# Patient Record
Sex: Male | Born: 1970 | Race: White | Hispanic: No | Smoking: Current every day smoker
Health system: Southern US, Community
[De-identification: ages and names within clinical notes are randomized; demographics above are authoritative.]

## PROBLEM LIST (undated history)

## (undated) DIAGNOSIS — F419 Anxiety disorder, unspecified: Secondary | ICD-10-CM

## (undated) DIAGNOSIS — F250 Schizoaffective disorder, bipolar type: Secondary | ICD-10-CM

## (undated) DIAGNOSIS — F319 Bipolar disorder, unspecified: Secondary | ICD-10-CM

## (undated) DIAGNOSIS — I639 Cerebral infarction, unspecified: Secondary | ICD-10-CM

## (undated) HISTORY — PX: FINGER SURGERY: SHX640

---

## 1998-10-27 ENCOUNTER — Encounter: Payer: Self-pay | Admitting: Emergency Medicine

## 1998-10-27 ENCOUNTER — Emergency Department (HOSPITAL_COMMUNITY): Admission: EM | Admit: 1998-10-27 | Discharge: 1998-10-27 | Payer: Self-pay | Admitting: Emergency Medicine

## 1999-08-15 ENCOUNTER — Emergency Department (HOSPITAL_COMMUNITY): Admission: EM | Admit: 1999-08-15 | Discharge: 1999-08-15 | Payer: Self-pay | Admitting: Emergency Medicine

## 1999-12-24 ENCOUNTER — Emergency Department (HOSPITAL_COMMUNITY): Admission: EM | Admit: 1999-12-24 | Discharge: 1999-12-25 | Payer: Self-pay | Admitting: Emergency Medicine

## 2000-06-09 ENCOUNTER — Emergency Department (HOSPITAL_COMMUNITY): Admission: EM | Admit: 2000-06-09 | Discharge: 2000-06-09 | Payer: Self-pay | Admitting: Emergency Medicine

## 2001-06-26 ENCOUNTER — Emergency Department (HOSPITAL_COMMUNITY): Admission: EM | Admit: 2001-06-26 | Discharge: 2001-06-26 | Payer: Self-pay | Admitting: Emergency Medicine

## 2001-06-28 ENCOUNTER — Encounter: Admission: RE | Admit: 2001-06-28 | Discharge: 2001-06-28 | Payer: Self-pay | Admitting: Internal Medicine

## 2003-03-20 ENCOUNTER — Emergency Department (HOSPITAL_COMMUNITY): Admission: EM | Admit: 2003-03-20 | Discharge: 2003-03-20 | Payer: Self-pay | Admitting: Emergency Medicine

## 2003-09-14 ENCOUNTER — Emergency Department (HOSPITAL_COMMUNITY): Admission: EM | Admit: 2003-09-14 | Discharge: 2003-09-14 | Payer: Self-pay | Admitting: Family Medicine

## 2004-11-08 ENCOUNTER — Inpatient Hospital Stay (HOSPITAL_COMMUNITY): Admission: EM | Admit: 2004-11-08 | Discharge: 2004-11-11 | Payer: Self-pay | Admitting: Psychiatry

## 2004-11-08 ENCOUNTER — Ambulatory Visit: Payer: Self-pay | Admitting: Psychiatry

## 2004-11-11 ENCOUNTER — Emergency Department (HOSPITAL_COMMUNITY): Admission: EM | Admit: 2004-11-11 | Discharge: 2004-11-12 | Payer: Self-pay | Admitting: Emergency Medicine

## 2004-11-21 ENCOUNTER — Ambulatory Visit: Payer: Self-pay | Admitting: Hospitalist

## 2004-11-21 ENCOUNTER — Inpatient Hospital Stay (HOSPITAL_COMMUNITY): Admission: EM | Admit: 2004-11-21 | Discharge: 2004-11-26 | Payer: Self-pay | Admitting: Emergency Medicine

## 2004-11-23 ENCOUNTER — Ambulatory Visit: Payer: Self-pay | Admitting: Cardiology

## 2004-12-18 ENCOUNTER — Ambulatory Visit: Payer: Self-pay | Admitting: Hospitalist

## 2005-01-30 ENCOUNTER — Emergency Department (HOSPITAL_COMMUNITY): Admission: EM | Admit: 2005-01-30 | Discharge: 2005-01-30 | Payer: Self-pay | Admitting: Emergency Medicine

## 2005-02-01 ENCOUNTER — Emergency Department (HOSPITAL_COMMUNITY): Admission: EM | Admit: 2005-02-01 | Discharge: 2005-02-01 | Payer: Self-pay | Admitting: *Deleted

## 2005-11-06 ENCOUNTER — Emergency Department (HOSPITAL_COMMUNITY): Admission: EM | Admit: 2005-11-06 | Discharge: 2005-11-06 | Payer: Self-pay | Admitting: Emergency Medicine

## 2005-11-28 ENCOUNTER — Inpatient Hospital Stay (HOSPITAL_COMMUNITY): Admission: EM | Admit: 2005-11-28 | Discharge: 2005-12-01 | Payer: Self-pay | Admitting: Emergency Medicine

## 2005-11-30 ENCOUNTER — Encounter (INDEPENDENT_AMBULATORY_CARE_PROVIDER_SITE_OTHER): Payer: Self-pay | Admitting: *Deleted

## 2005-12-04 ENCOUNTER — Emergency Department (HOSPITAL_COMMUNITY): Admission: EM | Admit: 2005-12-04 | Discharge: 2005-12-04 | Payer: Self-pay | Admitting: *Deleted

## 2005-12-09 ENCOUNTER — Emergency Department (HOSPITAL_COMMUNITY): Admission: EM | Admit: 2005-12-09 | Discharge: 2005-12-10 | Payer: Self-pay | Admitting: Emergency Medicine

## 2005-12-09 ENCOUNTER — Emergency Department (HOSPITAL_COMMUNITY): Admission: EM | Admit: 2005-12-09 | Discharge: 2005-12-09 | Payer: Self-pay | Admitting: Emergency Medicine

## 2005-12-12 ENCOUNTER — Emergency Department (HOSPITAL_COMMUNITY): Admission: EM | Admit: 2005-12-12 | Discharge: 2005-12-12 | Payer: Self-pay | Admitting: Emergency Medicine

## 2006-05-29 ENCOUNTER — Emergency Department (HOSPITAL_COMMUNITY): Admission: EM | Admit: 2006-05-29 | Discharge: 2006-05-30 | Payer: Self-pay | Admitting: Emergency Medicine

## 2006-06-17 ENCOUNTER — Emergency Department (HOSPITAL_COMMUNITY): Admission: EM | Admit: 2006-06-17 | Discharge: 2006-06-17 | Payer: Self-pay | Admitting: Emergency Medicine

## 2006-06-18 ENCOUNTER — Emergency Department (HOSPITAL_COMMUNITY): Admission: EM | Admit: 2006-06-18 | Discharge: 2006-06-18 | Payer: Self-pay | Admitting: Emergency Medicine

## 2006-07-25 ENCOUNTER — Emergency Department (HOSPITAL_COMMUNITY): Admission: EM | Admit: 2006-07-25 | Discharge: 2006-07-25 | Payer: Self-pay | Admitting: Emergency Medicine

## 2006-09-13 ENCOUNTER — Emergency Department (HOSPITAL_COMMUNITY): Admission: EM | Admit: 2006-09-13 | Discharge: 2006-09-13 | Payer: Self-pay | Admitting: Emergency Medicine

## 2006-09-22 ENCOUNTER — Ambulatory Visit: Payer: Self-pay | Admitting: Psychiatry

## 2006-09-22 ENCOUNTER — Emergency Department (HOSPITAL_COMMUNITY): Admission: EM | Admit: 2006-09-22 | Discharge: 2006-09-22 | Payer: Self-pay | Admitting: Emergency Medicine

## 2006-09-22 ENCOUNTER — Inpatient Hospital Stay (HOSPITAL_COMMUNITY): Admission: AD | Admit: 2006-09-22 | Discharge: 2006-09-26 | Payer: Self-pay | Admitting: Psychiatry

## 2006-11-17 ENCOUNTER — Emergency Department (HOSPITAL_COMMUNITY): Admission: EM | Admit: 2006-11-17 | Discharge: 2006-11-17 | Payer: Self-pay | Admitting: Emergency Medicine

## 2006-12-13 ENCOUNTER — Emergency Department (HOSPITAL_COMMUNITY): Admission: EM | Admit: 2006-12-13 | Discharge: 2006-12-13 | Payer: Self-pay | Admitting: Emergency Medicine

## 2006-12-15 ENCOUNTER — Ambulatory Visit: Payer: Self-pay | Admitting: *Deleted

## 2006-12-31 ENCOUNTER — Emergency Department (HOSPITAL_COMMUNITY): Admission: EM | Admit: 2006-12-31 | Discharge: 2006-12-31 | Payer: Self-pay | Admitting: Emergency Medicine

## 2006-12-31 ENCOUNTER — Ambulatory Visit: Payer: Self-pay | Admitting: *Deleted

## 2006-12-31 ENCOUNTER — Inpatient Hospital Stay (HOSPITAL_COMMUNITY): Admission: EM | Admit: 2006-12-31 | Discharge: 2007-01-06 | Payer: Self-pay | Admitting: *Deleted

## 2007-01-12 ENCOUNTER — Emergency Department (HOSPITAL_COMMUNITY): Admission: EM | Admit: 2007-01-12 | Discharge: 2007-01-13 | Payer: Self-pay | Admitting: Emergency Medicine

## 2007-02-11 ENCOUNTER — Emergency Department (HOSPITAL_COMMUNITY): Admission: EM | Admit: 2007-02-11 | Discharge: 2007-02-11 | Payer: Self-pay | Admitting: Emergency Medicine

## 2007-02-21 ENCOUNTER — Emergency Department (HOSPITAL_COMMUNITY): Admission: EM | Admit: 2007-02-21 | Discharge: 2007-02-21 | Payer: Self-pay | Admitting: Emergency Medicine

## 2007-02-24 ENCOUNTER — Emergency Department (HOSPITAL_COMMUNITY): Admission: EM | Admit: 2007-02-24 | Discharge: 2007-02-24 | Payer: Self-pay | Admitting: Emergency Medicine

## 2007-02-26 ENCOUNTER — Emergency Department (HOSPITAL_COMMUNITY): Admission: EM | Admit: 2007-02-26 | Discharge: 2007-02-26 | Payer: Self-pay | Admitting: *Deleted

## 2007-03-12 ENCOUNTER — Emergency Department (HOSPITAL_COMMUNITY): Admission: EM | Admit: 2007-03-12 | Discharge: 2007-03-12 | Payer: Self-pay | Admitting: Emergency Medicine

## 2007-06-02 ENCOUNTER — Emergency Department (HOSPITAL_COMMUNITY): Admission: EM | Admit: 2007-06-02 | Discharge: 2007-06-02 | Payer: Self-pay | Admitting: Emergency Medicine

## 2007-08-28 ENCOUNTER — Ambulatory Visit: Payer: Self-pay | Admitting: Vascular Surgery

## 2007-08-28 ENCOUNTER — Encounter (INDEPENDENT_AMBULATORY_CARE_PROVIDER_SITE_OTHER): Payer: Self-pay | Admitting: Emergency Medicine

## 2007-08-28 ENCOUNTER — Emergency Department (HOSPITAL_COMMUNITY): Admission: EM | Admit: 2007-08-28 | Discharge: 2007-08-28 | Payer: Self-pay | Admitting: Emergency Medicine

## 2007-11-08 ENCOUNTER — Emergency Department (HOSPITAL_COMMUNITY): Admission: EM | Admit: 2007-11-08 | Discharge: 2007-11-08 | Payer: Self-pay | Admitting: Emergency Medicine

## 2008-01-15 ENCOUNTER — Emergency Department (HOSPITAL_COMMUNITY): Admission: EM | Admit: 2008-01-15 | Discharge: 2008-01-15 | Payer: Self-pay | Admitting: Emergency Medicine

## 2008-04-09 ENCOUNTER — Emergency Department (HOSPITAL_COMMUNITY): Admission: EM | Admit: 2008-04-09 | Discharge: 2008-04-09 | Payer: Self-pay | Admitting: Emergency Medicine

## 2008-08-26 ENCOUNTER — Emergency Department (HOSPITAL_COMMUNITY): Admission: EM | Admit: 2008-08-26 | Discharge: 2008-08-26 | Payer: Self-pay | Admitting: Emergency Medicine

## 2008-08-27 ENCOUNTER — Inpatient Hospital Stay (HOSPITAL_COMMUNITY): Admission: RE | Admit: 2008-08-27 | Discharge: 2008-08-31 | Payer: Self-pay | Admitting: *Deleted

## 2008-08-27 ENCOUNTER — Encounter: Payer: Self-pay | Admitting: Emergency Medicine

## 2008-08-27 ENCOUNTER — Ambulatory Visit: Payer: Self-pay | Admitting: *Deleted

## 2008-10-30 ENCOUNTER — Emergency Department (HOSPITAL_COMMUNITY): Admission: EM | Admit: 2008-10-30 | Discharge: 2008-10-30 | Payer: Self-pay | Admitting: Emergency Medicine

## 2008-10-31 ENCOUNTER — Inpatient Hospital Stay (HOSPITAL_COMMUNITY): Admission: AD | Admit: 2008-10-31 | Discharge: 2008-11-04 | Payer: Self-pay | Admitting: Psychiatry

## 2008-10-31 ENCOUNTER — Other Ambulatory Visit: Payer: Self-pay | Admitting: Emergency Medicine

## 2008-10-31 ENCOUNTER — Ambulatory Visit: Payer: Self-pay | Admitting: Psychiatry

## 2009-05-20 ENCOUNTER — Emergency Department (HOSPITAL_COMMUNITY): Admission: EM | Admit: 2009-05-20 | Discharge: 2009-05-20 | Payer: Self-pay | Admitting: Emergency Medicine

## 2009-10-23 ENCOUNTER — Emergency Department (HOSPITAL_COMMUNITY): Admission: EM | Admit: 2009-10-23 | Discharge: 2009-10-23 | Payer: Self-pay | Admitting: Emergency Medicine

## 2010-01-12 ENCOUNTER — Emergency Department (HOSPITAL_COMMUNITY): Admission: EM | Admit: 2010-01-12 | Discharge: 2010-01-12 | Payer: Self-pay | Admitting: Emergency Medicine

## 2010-05-05 LAB — CBC
HCT: 46.4 % (ref 39.0–52.0)
Hemoglobin: 16.3 g/dL (ref 13.0–17.0)
MCHC: 35.1 g/dL (ref 30.0–36.0)
RBC: 4.84 MIL/uL (ref 4.22–5.81)
WBC: 5.2 10*3/uL (ref 4.0–10.5)

## 2010-05-05 LAB — DIFFERENTIAL
Basophils Relative: 1 % (ref 0–1)
Lymphocytes Relative: 16 % (ref 12–46)
Monocytes Relative: 14 % — ABNORMAL HIGH (ref 3–12)
Neutro Abs: 3.5 10*3/uL (ref 1.7–7.7)
Neutrophils Relative %: 68 % (ref 43–77)

## 2010-05-05 LAB — POCT I-STAT, CHEM 8
BUN: 7 mg/dL (ref 6–23)
Chloride: 103 mEq/L (ref 96–112)
Glucose, Bld: 104 mg/dL — ABNORMAL HIGH (ref 70–99)
HCT: 50 % (ref 39.0–52.0)
Potassium: 3.8 mEq/L (ref 3.5–5.1)

## 2010-05-29 LAB — DIFFERENTIAL
Basophils Absolute: 0.1 10*3/uL (ref 0.0–0.1)
Eosinophils Relative: 2 % (ref 0–5)
Lymphocytes Relative: 17 % (ref 12–46)
Lymphocytes Relative: 19 % (ref 12–46)
Lymphs Abs: 1.5 10*3/uL (ref 0.7–4.0)
Monocytes Relative: 8 % (ref 3–12)
Neutro Abs: 5.2 10*3/uL (ref 1.7–7.7)
Neutrophils Relative %: 73 % (ref 43–77)
Neutrophils Relative %: 71 % (ref 43–77)

## 2010-05-29 LAB — RAPID URINE DRUG SCREEN, HOSP PERFORMED
Amphetamines: NOT DETECTED
Amphetamines: NOT DETECTED
Barbiturates: NOT DETECTED
Benzodiazepines: POSITIVE — AB
Benzodiazepines: POSITIVE — AB
Cocaine: POSITIVE — AB
Cocaine: POSITIVE — AB
Opiates: NOT DETECTED
Tetrahydrocannabinol: POSITIVE — AB

## 2010-05-29 LAB — CBC
HCT: 47.4 % (ref 39.0–52.0)
HCT: 45.9 % (ref 39.0–52.0)
MCV: 96.1 fL (ref 78.0–100.0)
Platelets: 215 10*3/uL (ref 150–400)
Platelets: 198 10*3/uL (ref 150–400)
RBC: 4.93 MIL/uL (ref 4.22–5.81)
RDW: 12.4 % (ref 11.5–15.5)
WBC: 8.9 10*3/uL (ref 4.0–10.5)

## 2010-05-29 LAB — ETHANOL
Alcohol, Ethyl (B): <5 mg/dL (ref 0–10)
Alcohol, Ethyl (B): <5 mg/dL (ref 0–10)

## 2010-05-29 LAB — BASIC METABOLIC PANEL
BUN: 13 mg/dL (ref 6–23)
Calcium: 8.9 mg/dL (ref 8.4–10.5)
Creatinine, Ser: 1.1 mg/dL (ref 0.4–1.5)
GFR calc non Af Amer: >60 mL/min (ref >60)
Glucose, Bld: 120 mg/dL — ABNORMAL HIGH (ref 70–99)

## 2010-05-29 LAB — POCT I-STAT, CHEM 8
BUN: 20 mg/dL (ref 6–23)
Creatinine, Ser: 1.1 mg/dL (ref 0.4–1.5)
Glucose, Bld: 89 mg/dL (ref 70–99)
Potassium: 3.9 mEq/L (ref 3.5–5.1)
Sodium: 137 mEq/L (ref 135–145)

## 2010-05-31 LAB — CBC
HCT: 46.2 % (ref 39.0–52.0)
Hemoglobin: 16.1 g/dL (ref 13.0–17.0)
Hemoglobin: 17 g/dL (ref 13.0–17.0)
MCHC: 34.9 g/dL (ref 30.0–36.0)
MCHC: 34.4 g/dL (ref 30.0–36.0)
MCV: 97.1 fL (ref 78.0–100.0)
MCV: 96.8 fL (ref 78.0–100.0)
Platelets: 240 10*3/uL (ref 150–400)
RBC: 4.76 MIL/uL (ref 4.22–5.81)
RBC: 5.09 MIL/uL (ref 4.22–5.81)
RDW: 13.2 % (ref 11.5–15.5)
WBC: 9.4 10*3/uL (ref 4.0–10.5)

## 2010-05-31 LAB — RAPID URINE DRUG SCREEN, HOSP PERFORMED
Amphetamines: NOT DETECTED
Barbiturates: NOT DETECTED
Benzodiazepines: NOT DETECTED
Cocaine: POSITIVE — AB
Cocaine: POSITIVE — AB
Opiates: NOT DETECTED
Tetrahydrocannabinol: NOT DETECTED
Tetrahydrocannabinol: POSITIVE — AB

## 2010-05-31 LAB — POCT I-STAT, CHEM 8
Creatinine, Ser: 1 mg/dL (ref 0.4–1.5)
Glucose, Bld: 96 mg/dL (ref 70–99)
Hemoglobin: 16.7 g/dL (ref 13.0–17.0)
TCO2: 28 mmol/L (ref 0–100)

## 2010-05-31 LAB — BASIC METABOLIC PANEL
CO2: 26 mEq/L (ref 19–32)
Chloride: 107 mEq/L (ref 96–112)
GFR calc Af Amer: >60 mL/min (ref >60)
Potassium: 3.6 mEq/L (ref 3.5–5.1)
Sodium: 140 mEq/L (ref 135–145)

## 2010-05-31 LAB — DIFFERENTIAL
Basophils Absolute: 0 K/uL (ref 0.0–0.1)
Basophils Relative: 0 % (ref 0–1)
Basophils Relative: 0 % (ref 0–1)
Eosinophils Absolute: 0 10*3/uL (ref 0.0–0.7)
Eosinophils Absolute: 0 10*3/uL (ref 0.0–0.7)
Eosinophils Relative: 1 % (ref 0–5)
Lymphocytes Relative: 17 % (ref 12–46)
Lymphs Abs: 1.6 10*3/uL (ref 0.7–4.0)
Monocytes Absolute: 0.6 K/uL (ref 0.1–1.0)
Monocytes Absolute: 0.6 10*3/uL (ref 0.1–1.0)
Monocytes Relative: 7 % (ref 3–12)
Monocytes Relative: 8 % (ref 3–12)
Neutro Abs: 7.1 K/uL (ref 1.7–7.7)
Neutro Abs: 6 10*3/uL (ref 1.7–7.7)
Neutrophils Relative %: 75 % (ref 43–77)

## 2010-05-31 LAB — ETHANOL: Alcohol, Ethyl (B): <5 mg/dL (ref 0–10)

## 2010-06-09 LAB — POCT CARDIAC MARKERS
CKMB, poc: <1 ng/mL — ABNORMAL LOW (ref 1.0–8.0)
Troponin i, poc: <0.05 ng/mL (ref 0.00–0.09)

## 2010-06-09 LAB — POCT I-STAT, CHEM 8
BUN: 10 mg/dL (ref 6–23)
Calcium, Ion: 1.18 mmol/L (ref 1.12–1.32)
Chloride: 100 mEq/L (ref 96–112)
Creatinine, Ser: 1 mg/dL (ref 0.4–1.5)
Glucose, Bld: 100 mg/dL — ABNORMAL HIGH (ref 70–99)
HCT: 47 % (ref 39.0–52.0)
Hemoglobin: 16 g/dL (ref 13.0–17.0)
Potassium: 3.6 mEq/L (ref 3.5–5.1)
Sodium: 139 mEq/L (ref 135–145)
TCO2: 29 mmol/L (ref 0–100)

## 2010-06-09 LAB — RAPID URINE DRUG SCREEN, HOSP PERFORMED
Amphetamines: NOT DETECTED
Barbiturates: NOT DETECTED
Benzodiazepines: NOT DETECTED
Cocaine: NOT DETECTED
Opiates: NOT DETECTED
Tetrahydrocannabinol: POSITIVE — AB

## 2010-07-07 NOTE — H&P (Signed)
NAMESELMER, ADDUCI NO.:  [id number]   MEDICAL RECORD NO.:  [id number]          PATIENT TYPE:  IPS   LOCATION:  0304                          FACILITY:  [facility]   PHYSICIAN:  Jasmine Pang, M.D. DATE OF BIRTH:  Mar 29, 1970   DATE OF ADMISSION:  08/27/2008  DATE OF DISCHARGE:                       PSYCHIATRIC ADMISSION ASSESSMENT   TIME:  1335.   IDENTIFYING INFORMATION:  This is a 40 year old male.  This is a  voluntary admission.  He is single.   HISTORY OF PRESENT ILLNESS:  Second [facility] admission for this 40 year old  who sees Dr. Ladona Ridgel at [facility].  He presents  requesting detox from alcohol after he relapsed 3 weeks ago.  Apparently  he went to visit his father who was having health problems and said that  it was really hard for him to cope with the situation.  Started drinking  alcohol at age 66 and has had 3 years of abstinence prior to his relapse  3 weeks ago.  Also endorses using cocaine $50-100 worth daily for the  past 3 weeks.  Alcohol use is somewhere between  18-24 cans of beers most every day for the past 3 weeks.  He had stopped  his previous medication being Invega, Celexa and Klonopin about 3 weeks  ago.  He denies suicidal thoughts.   PAST PSYCHIATRIC HISTORY:  Previous [facility] admission in 2008 after which he  was referred to [facility].  He was stabilized on  Invega 6 mg and Celexa 20 mg at that time.  Has been using alcohol since  age 32.  Been using cocaine since he was in his 89s.  Has been treated  as an outpatient at [facility] and has been generally  compliant with appointments and medications.  He denies a history of  suicide attempts.   SOCIAL HISTORY:  Single male.  Offers little information today.   FAMILY HISTORY:  Not available.   MEDICAL HISTORY:  History of present disorder and polysubstance abuse.  Primary care Imogine Carvell is unknown.  Medical problems are none.   CURRENT MEDICATIONS:  1. Invega 6 mg daily.  2. Klonopin 0.5 mg b.i.d.  3. Celexa 20 mg daily.   DRUG ALLERGIES:  NONE.   PHYSICAL EXAMINATION:  Physical exam was done in the emergency room at  [facility] and is noted in the record.  Generally healthy male in no  distress.  Last use of cocaine and alcohol was the day prior to  admission.   LABORATORY DATA:  CBC:  WBC 9.4, hemoglobin 16.1, hematocrit 48.2,  platelets 240,000 and MCV 97.1.  Basic chemistry is normal.  Sodium 139,  potassium 4.2, chloride 102, carbon dioxide 28, BUN 6, creatinine 0.98  and random glucose 121.  Alcohol level was less than 5.  Urine drug  screen positive for cocaine and marijuana.   MENTAL STATUS EXAM:  This is a an alert male who has been very irritable  today.  Does not respond even though he is moving in the bed.  Was  refusing to talk to the nurses  earlier.  He has been fully alert and  oriented.  Asked to be left alone so he can sleep, uncooperative with  interview.  He is oriented to person, place and situation.   AXIS I:  Mood disorder not otherwise specified.  Alcohol abuse rule out  dependence.  Cannabis abuse.  AXIS II:  No diagnosis.  AXIS III:  No diagnosis.  AXIS IV:  Moderate-severe family issues.  AXIS V:  Current 48, past year not known.   PLAN:  The plan is to voluntarily admit him with a goal of a safe detox  on Librium protocol in 5 days.  He is on our dual diagnosis unit.  We  are giving him Librium 25 mg q.4 h. p.r.n. at this point.  He has been  rather reclusive and irritable.  We are continuing his Celexa and Invega  as he was previously taking.      Margaret A. Lorin Picket, N.P.      Jasmine Pang, M.D.  Electronically Signed    MAS/MEDQ  D:  08/28/2008  T:  08/28/2008  Job:  [id number]
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
## 2010-07-10 NOTE — Consult Note (Signed)
NAMEMONICA, ZAHLER NO.:  [id number]   MEDICAL RECORD NO.:  [id number]          PATIENT TYPE:  INP   LOCATION:  6740                         FACILITY:  [facility]   PHYSICIAN:  Antonietta Breach, M.D.  DATE OF BIRTH:  [birth year]   DATE OF CONSULTATION:  12/01/2005  DATE OF DISCHARGE:                                   CONSULTATION   [name] became very frustrated last night and left the unit.  He came  back voluntarily.  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  He has no  thoughts of harming himself.  He has no thoughts of harming others.  He has  no delusions or hallucinations.  He has no racing thoughts, and he has no  increased energy or decreased need for sleep.   He reiterates that he relapsed on cocaine and alcohol.  He was having some  symptoms of his blood pressure being low and called the emergency services  on his own.  He explains that he would call emergency services again for any  emergency symptoms including thoughts of harming himself, thoughts of  harming others, racing thoughts or severe distress.  He also agrees to  follow up with the 12-step community and alcohol/drug services.  Regarding  his history of bipolar disorder, he does have some complicating factors of  various substances over the years; however, it appears that he has had at  least some hypomanic episodes.  He does not have the money for any  psychotropic medications.   The undersigned emphasized that because he does not have the money he should  follow up with the [facility] as soon as possible  for reevaluation of his mental status, ego supportive therapy as well as  potential psychotropic medication.   MENTAL STATUS EXAMINATION:  As above, [name] has fully alert mental  status.  He is oriented to all  spheres.  His judgment is intact.  Thought  process logical, coherent, goal directed.  No looseness of association of  thought content.  No thoughts of harming himself.  No thoughts of harming  others.  No delusions, no hallucinations.   ASSESSMENT:   AXIS I:  Mood disorder not otherwise specified, 293.83 (possibility of a  functional cyclic mood condition, also the patient has substance-induced  factors).  Rule out polysubstance dependence.   Regardless of the etiology of his previous mental status disturbances, he is  currently having a mental status within normal limits with normal social  behavior and no evidence of any destructive behavioral state.  He does not  present any criteria for forced treatment.   The undersigned recommended inpatient dual diagnosis treatment for further  relapse prevention skills and coping skills as well as stress management and  further pursuing the need for psychotropic medication; however, the patient  declined and he is not committable after recovering from his intoxicated  state.  RECOMMENDATIONS:  1. The emergency services plan as reviewed with the patient above.  2. Would ask the case manager to schedule this patient with alcohol/drug      services in [facility] downtown.  Also 12-      step community has been recommended to the patient.      Antonietta Breach, M.D.  Electronically Signed     JW/MEDQ  D:  12/01/2005  T:  12/01/2005  Job:  [id number]

## 2010-07-10 NOTE — Discharge Summary (Signed)
[name], [name] NO.:  [id number]   MEDICAL RECORD NO.:  [id number]          PATIENT TYPE:  IPS   LOCATION:  0401                          FACILITY[facility]  [facility]   PHYSICIAN:  Geoffery Lyons, M.D.      DATE OF BIRTH:  Jul 09, 1970   DATE OF ADMISSION:  11/08/2004  DATE OF DISCHARGE:  11/11/2004                                 DISCHARGE SUMMARY   CHIEF COMPLAINT AND PRESENT ILLNESS:  This was the first admission to [facility] for this 40 year old divorced white male.  He  presented to [facility] after relapsing on crack and  alcohol.  He went to the [facility] for medical clearance.  He  claimed he felt like he was in DTs.  He started his last alcohol drink and  crack was two hours prior to being seen at the emergency department.  UDS  confirmed that he has been using cocaine.   PAST PSYCHIATRIC HISTORY:  He had been to [facility] for drug rehab.  Currently  unemployed and homeless.   ALCOHOL/DRUG HISTORY:  Began using alcohol at age 29, crack at age 21,  marijuana occasionally.  Endorsed having blackouts when drinking.   MEDICAL HISTORY:  Claimed that he was told that he had staph on his arms.   MEDICATIONS:  None.   PHYSICAL EXAMINATION:  Performed and failed to show any acute findings.   LABORATORY DATA:  Blood chemistry with glucose 104.  Drug screen positive  for cocaine.  CBC with white blood cells 7.0, hemoglobin 16.4.  Other values  within normal limits.   MENTAL STATUS EXAM:  Alert, cooperative, disheveled male.  Mood was  irritable.  Affect was anxious.  Thought processes were organized, goal-  oriented.  Initially started leave me alone and close the door.  You can  get that information off the computer.  He was denying any active suicidal  or homicidal ideation.  Denied active auditory or visual hallucinations.  Endorsed a history of attempts to hurt himself.  Had tried to hang himself  and also thoughts of  using a gun but was able to contract for safety.   ADMISSION DIAGNOSES:  AXIS I:  Alcohol dependence.  Cocaine and marijuana  abuse.  Mood disorder not otherwise specified.  AXIS II:  No diagnosis.  AXIS III:  Questionable staph infection on his arm.  AXIS IV:  Moderate.  AXIS V:  GAF upon admission 30; highest GAF in the last year 60.   HOSPITAL COURSE:  He was admitted.  He was started in individual and group  psychotherapy.  He was detoxified with Librium.  He was given Symmetrel 100  mg twice a day.  On November 09, 2004, he was still very irritable, claimed  he was tired, unable to sleep, angry, claimed that if he was not allowed to  sleep, he was going to leave, would not answer questions.  Endorsed he  wanted to be left alone.  Was wanting to rest and sleep.  Endorsed that he  had relapsed on crack and alcohol  and he was in jail and now he is homeless.  On September 17th, he was endorsing body aches.  He had some sores on right  forearm.  On September 19th, he was less irritable.  Endorsed  lightheadedness if he was trying to stand up.  He seemed not clear what was  going on before he came to the unit.  Not tolerant of __________ interaction  but definitely amiable to talk.  Still reserved and guarded.  Continued to  be somatically focused.  Objectively, he was stable.  He was still  endorsing, when he stood up, he was seeing black spots.  He tended to be  more uncooperative, having some difficulty with his stomach.  An EKG was  normal.  On September 19th, later this afternoon, he was wanting to leave  the facility.  He had to be restrained.  He was able to get out of  restraint, became more calm and pleasant.  He was able to get to a point  where he was released from restraint and he was able to go back to his room.  The main issue was that he was wanting to leave and we were not ready to let  him go.  On September 20th, he was in full contact with reality.  He was  more  insightful.  He felt he was ready to go, more euthymic, no evidence of  the acute agitation, irritability or anger, endorsed he was committed to  abstaining and was going to pursue outpatient treatment.  He was in full  contact with reality.  No suicidal or homicidal ideation.   DISCHARGE DIAGNOSES:  AXIS I:  Alcohol dependence.  Marijuana and cocaine  abuse.  Mood disorder not otherwise specified.  AXIS II:  Personality disorder not otherwise specified.  Rule out borderline  intellectual functioning.  AXIS III:  No diagnosis.  AXIS IV:  Moderate.  AXIS V:  GAF upon discharge 50.   DISCHARGE MEDICATIONS:  Discharged on no medications as he refused.   FOLLOW UP:  He was going to the [employer].      Geoffery Lyons, M.D.  Electronically Signed     IL/MEDQ  D:  12/07/2004  T:  12/08/2004  Job:  [id number]

## 2010-07-10 NOTE — Discharge Summary (Signed)
[name], GULLEY NO.:  [id number]   MEDICAL RECORD NO.:  [id number]          PATIENT TYPE:  INP   LOCATION:  6740                         FACILITY:  [facility]   PHYSICIAN:  Yetta Barre, M.D.  DATE OF BIRTH:  1970/05/15   DATE OF ADMISSION:  11/27/2005  DATE OF DISCHARGE:  12/01/2005                                 DISCHARGE SUMMARY   DISCHARGE DIAGNOSES:  1. Polysubstance abuse including alcohol and cocaine.  2. Bipolar disorder.   DISCHARGE MEDICATIONS:  The patient was not discharged on any medications.   DISPOSITION AND FOLLOWUP:  The patient was encouraged to follow up with the  alcohol and drug services of the [facility] and he  was also recommended to follow up with the services of the 12-step.   PROCEDURES PERFORMED:  1. 2-D echocardiogram on November 30, 2005 showed normal left ventricular      function.  2. Chest x-ray on November 30, 2005 showed no active cardiopulmonary      disease.   CONSULTATIONS:  Dr. Antonietta Breach from psychiatry.   ADMITTING HISTORY AND PHYSICAL:  This is a 40 year old male with a history  of polysubstance abuse and depression who has been feeling weak after  binging on cocaine and ethanol for the past 2 weeks.  The patient had also  expressed suicidal ideations, per nursing reports and evaluation by the  acting.  The patient says he does not have energy to move his arms and legs  and does not have energy to walk since yesterday.  He has coughed up blood a  few times, but says this was a small amount, much less than a cup.  The  patient feels nauseated, but denies vomiting and diarrhea.  The patient has  a headache.  His last drink was last night.  Prior to this time, the patient  had been sober for 15 months, but has relapsed because he has been feeling  extremely depressed.  The patient refused to answer many of our questions.   PHYSICAL EXAMINATION:  VITAL SIGNS:  Temperature 97.6, blood  pressure  102/60, pulse 79, respirations 18, O2 saturation 98% on room air.  GENERAL:  The patient is lying in bed, sleeping, and unwilling to wake to  talk with [place].  HEENT:  His pupils were round and reactive.  Extraocular muscles are intact.  His oropharynx and nares are clear.  NECK:  Supple.  No lymph nodes palpated.  LUNGS:  Clear to auscultation bilaterally with good air movement.  HEART:  Regular rate and rhythm without any murmurs.  Good peripheral  pulses.  ABDOMEN:  He complained of mild tenderness to palpation on his abdomen, but  it was diffuse.  No rebounding or guarding.  No masses.  No  hepatosplenomegaly.  NEUROLOGIC:  He had 2/5 strength in the upper and lower extremities, but I  am sure patient was not fully cooperating versus real weakness.  PSYCHOLOGIC:  The patient refused to stay awake to talk to [place].  He had a  blunted affect and just wanted to sleep.  LABORATORY DATA:  Sodium 140, potassium 3.5, chloride 106, bicarb 25, BUN  10, creatinine 1.  CBC:  White count 5.6, hemoglobin 13.6, platelets  194,000.  UDS positive for alcohol and cocaine.  PTT 29, PT 13.4, lipase 22.  Bilirubin 0.8, alkaline phosphatase 51, AST 17, ALT 14.  Protein 6, albumin  3.5, calcium 8.6.   HOSPITAL COURSE:  1. Hypotension.  In the emergency room, the patient's blood pressure      dropped to the high 70s over the high 30s.  The patient received almost      3 liters of IV fluid boluses.  His heart rate remained between 40 and      50.  Because blood pressure did not respond to the fluid boluses, we      started dopamine.  The patient remained on dopamine for less than a      day.  While he was off of the dopamine, his blood pressure remained      between 90-100/40-50.  During this time, the patient was asymptomatic.      He was alert and oriented.  He did not have any dizziness when up and      walking.  We feel that this low blood pressure is the patient's      baseline as this is  clearly reported in his previous admission to the      hospital.  We, however, ruled out cardiac disease as his cardiac      enzymes were normal and his 2-D echo did not show any left ventricular      dysfunction of any evidence of structure-involved abnormalities.  His      blood cultures were negative.  He had a normal TSH and free T4.  His      cosyntropin stim tests were within normal limits.  2. Alcohol and cocaine abuse.  During this hospital course, the patient      did show signs of agitation at times.  He was placed on p.r.n. Ativan.      The patient did not show any moods or signs of withdrawal and he      remained stable.  As described above, the patient was encouraged to      follow up with the alcohol and drug services of the [facility].  3. Suicidal ideations.  When the patient was first evaluated in the      emergency room, he expressed thoughts of hurting himself and initially,      voluntarily, committed himself for help and treatment.  After he was      admitted however, he no longer expressed any thoughts of self injury.      He was reevaluated by Dr. Jeanie Sewer, who felt that the patient      currently had a mental status within normal limits with normal social      behavior and no evidence of any destructive behavioral state.  He felt      that he could not, anymore, meet the criteria for forced treatment and,      as such, recommended discharge of the patient from the hospital.   VITAL SIGNS:  Temperature 97, blood pressure 90-100/40, heart rate 45 to 60,  97% on room air.      Yetta Barre, M.D.  Electronically Signed     SS/MEDQ  D:  12/02/2005  T:  12/03/2005  Job:  [id number]

## 2010-07-10 NOTE — H&P (Signed)
NAMEWILFRIDO, [name] NO.:  [id number]   MEDICAL RECORD NO.:  [id number]          PATIENT TYPE:  IPS   LOCATION:  0505                          FACILITY:  [facility]   PHYSICIAN:  Anselm Jungling, MD  DATE OF BIRTH:  10/05/1970   DATE OF ADMISSION:  11/08/2004  DATE OF DISCHARGE:                         PSYCHIATRIC ADMISSION ASSESSMENT   This is a voluntary admission to the services of Dr. Geralyn Flash.   IDENTIFYING INFORMATION:  This is a 40 year old divorced white male.  Apparently he presented to [facility] yesterday reporting  relapse on crack and alcohol.  He had been clean 70 days while in jail.  He  was sponsored by [facility].  He was sent to the emergency department  at [facility] for medical clearance.  He stated that he felt like he was in  DTs.  He stated that he last drank alcohol and did crack about 2 hours prior  to being seen in the emergency department.  This was at 3:00 in the morning.  His alcohol level was 0.  His UDS confirmed that he had been using cocaine.  He had no other abnormal labs.  He was transferred here to the [facility].  He denies any prior inpatient for psychiatry.  He states he  has been to [facility] for drug rehab.   SOCIAL HISTORY:  He states he has been married and divorced x 2.  He has no  children.  He states he went to the 10th grade.  He has had different jobs.  He is currently unemployed and homeless.   FAMILY HISTORY:  He states his whole family has substance abuse.   ALCOHOL AND DRUG HISTORY:  His personal history shows that he began using  alcohol at age 89, crack at age 71 and marijuana is occasional with last use  being yesterday.  He also reports having had blackouts when drinking.   PRIMARY CARE Manroop Jakubowicz:  He does not have one.   MEDICAL HISTORY:  He states some doctor told him that he has staph on his  arms.   MEDICATIONS:  He is not prescribed any.   DRUG ALLERGIES:  He  does not have any.   MENTAL STATUS EXAM:  He can be aroused.  He is disheveled.  His speech is  slow.  His mood is irritable.  His thought processes are organized and goal  oriented.  Leave me alone and close the door.  You can get that  information off the computer.  Concentration and memory when seen by the  ACT team were intact.  His intelligence is at least average.  He is  currently denying suicidal or homicidal ideation.  He is denying auditory or  visual hallucinations.  I guess he was in jail in July for a DWI.  He was in  jail for 70 days when he was clean and sober in July.  He reports a history  for attempts to hurt himself.  He states he tried to hang himself and also  had thoughts of using a gun, according  to the ACT team.   ADMISSION DIAGNOSES:  AXIS I:  Polysubstance abuse.  Rule out depression.  AXIS II:  Rule out personality disorder.  AXIS III:  Questionable staphylococcus on arms.  Reports blackouts when  drinking.  AXIS IV:  Severe.  He is homeless.  He is unemployed.  He has no support.  He is currently in violation of his probation due to a DWI.  AXIS V:  30.   PLAN:  We will admit to support through withdrawal.  We will initiate  appropriate antidepressants and work with his probation officer regarding  appropriate further outpatient care.      Mickie Leonarda Salon, P.A.-C.      Anselm Jungling, MD  Electronically Signed    MD/MEDQ  D:  11/08/2004  T:  11/08/2004  Job:  [id number]
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
## 2010-07-10 NOTE — Discharge Summary (Signed)
NAMEHUSAM, [name] NO.:  [id number]   MEDICAL RECORD NO.:  [id number]          PATIENT TYPE:  IPS   LOCATION:  0304                          FACILITY:  [facility]   PHYSICIAN:  Jasmine Pang, M.D. DATE OF BIRTH:  May 30, 1970   DATE OF ADMISSION:  08/27/2008  DATE OF DISCHARGE:  08/31/2008                               DISCHARGE SUMMARY   IDENTIFICATION:  This is a 40 year old single male who was admitted on a  voluntary basis on August 27, 2008.   HISTORY OF PRESENT ILLNESS:  This is a second [facility] admission for this 68-  year-old who sees Dr. Ladona Ridgel at the [facility].  He presents requesting detox from alcohol after he relapsed 3  weeks ago.  Apparently, he went to visit his father who was having  health problems.  He said it was really hard for him to cope with this  situation.  He started drinking alcohol at age 40 and has had 3 years of  abstinence prior to his relapse 3 weeks ago.  He also endorsed as using  cocaine 50 to 100 dollars worth daily for the past 3 weeks.  Alcohol use  is somewhere between 18 to 24 cans of beer almost every day for the past  3 weeks.  He stopped his previous medications, which were Invega,  Celexa, and Klonopin about 3 weeks ago.  He denies suicidal thoughts.  He had a previous [facility] admission in [facility], after which he was referred to  [facility].  He was stabilized on Invega 6 mg, Celexa  20 mg at a time.  He had been using alcohol since the age of 74.  He has  been using cocaine since he was in his 39s.  He has been treated as an  outpatient at the [facility] and has been  generally compliant with appointments and medications.  He denies a  history of suicide attempts.  For further admission information, see  psychiatric admission assessment.   PHYSICAL FINDINGS:  Physical exam was done in the emergency room at  [facility] and is noted in the record.  This is  a generally  healthy male, in no acute distress.  Last use of cocaine and alcohol was  a day prior to admission.   DIAGNOSTIC STUDIES:  CBC revealed a WBC of 9.4, hemoglobin of 16.1,  hematocrit of 48.2, platelets of 240,000, and MCV 97.1.  Basic  chemistries were within normal limits.  Sodium was 139, potassium 4.2,  chloride 102, carbon dioxide 28, BUN 6, and creatinine 0.98.  Random  glucose was 121.  Alcohol level was less than 5.  Urine drug screen was  positive for cocaine and marijuana.   HOSPITAL COURSE:  Upon admission, the patient was started on Ambien 10  mg p.o. q.h.s. p.r.n. insomnia.  He was also started on the Librium  detox protocol.  He was restarted on his Invega 6 mg daily and Celexa 20  mg daily.  In individual sessions, the patient states he has been  diagnosed with bipolar disorder and had been off his medications for the  past month.  He reported having racing thoughts and has been sleeping  only 2 to 3 hours at night.  His mood was dysphoric and anxious.  Affect  was consistent with this. As hospitalization progressed, on August 29, 2008, he had difficulty falling asleep, middle of the night awakening.  He continued to be depressed and anxious.  There was no suicidal  ideation.  On August 30, 2008, he was lying in bed asleep, but was  cooperative when I talked with him.  His mood was continuing to improve.  He was still having some middle of the night awakening.  On August 31, 2008, the patient stated I feel a whole lot better.  He endorses his  desire to maintain sobriety and plans to live in an [address].  He is  meeting with [facility] staff this morning and is willing to attend AA  meetings.  It was planned he could be discharged to the [facility].  His mood was less depressed, less anxious.  Affect was consistent with  mood.  There was no suicidal or homicidal ideation.  No thoughts of self-  injurious behavior.  No auditory or visual hallucinations.  No  paranoia  or delusions.  Thoughts were logical and goal-directed.  Thought  content, no predominant theme.  Cognitive grossly intact.  Insight good.  Judgment good.  Impulse control good.   DISCHARGE DIAGNOSES:  Axis I:  Mood disorder, not otherwise specified;  alcohol dependence; cannabis abuse.  Axis II:  No diagnosis.  Axis III:  No diagnosis.  Axis IV:  Moderate (family issues, burden of psychiatric illness, burden  of chemical dependence).  Axis V:  Global assessment of functioning was 55 upon discharge.  GAF  was 48 upon admission.  GAF highest past year was 65.   DISCHARGE PLAN:  There was no specific activity level or dietary  restrictions.  The patient was going to live in an [facility].  He  will go to the [facility] on October 03, 2008, at 1 p.m.  He will  also go to the [facility] IOP on September 04, 2008, at 9 a.m.   DISCHARGE MEDICATIONS:  1. Invega 6 mg daily.  2. Celexa 20 mg daily.      Jasmine Pang, M.D.  Electronically Signed     BHS/MEDQ  D:  09/11/2008  T:  09/12/2008  Job:  [id number]

## 2010-10-15 ENCOUNTER — Emergency Department (HOSPITAL_COMMUNITY)
Admission: EM | Admit: 2010-10-15 | Discharge: 2010-10-15 | Discharge status: Against Medical Advice | Payer: Self-pay | Attending: Emergency Medicine | Admitting: Emergency Medicine

## 2010-11-11 LAB — I-STAT 8, (EC8 V) (CONVERTED LAB)
BUN: 4 — ABNORMAL LOW
Bicarbonate: 25.5 — ABNORMAL HIGH
Bicarbonate: 26.6 — ABNORMAL HIGH
Chloride: 106
HCT: 48
Hemoglobin: 15.3
Hemoglobin: 16.3
Operator id: 198171
Operator id: 284141
Potassium: 4.1
Sodium: 140
TCO2: 27
pCO2, Ven: 43.4 — ABNORMAL LOW

## 2010-11-11 LAB — DIFFERENTIAL
Basophils Absolute: 0
Lymphocytes Relative: 20
Monocytes Absolute: 0.5
Monocytes Relative: 8
Neutro Abs: 4.3
Neutrophils Relative %: 67

## 2010-11-11 LAB — POCT I-STAT CREATININE
Creatinine, Ser: 1
Creatinine, Ser: 1
Operator id: 198171

## 2010-11-11 LAB — RAPID URINE DRUG SCREEN, HOSP PERFORMED
Amphetamines: NOT DETECTED
Tetrahydrocannabinol: NOT DETECTED

## 2010-11-11 LAB — ETHANOL: Alcohol, Ethyl (B): <5

## 2010-11-11 LAB — CBC
Hemoglobin: 14.8
RBC: 4.53

## 2010-11-17 LAB — URINALYSIS, ROUTINE W REFLEX MICROSCOPIC
Bilirubin Urine: NEGATIVE
Glucose, UA: NEGATIVE
Hgb urine dipstick: NEGATIVE
Ketones, ur: NEGATIVE
Nitrite: NEGATIVE
Protein, ur: NEGATIVE
Specific Gravity, Urine: 1.013
Urobilinogen, UA: 1
pH: 6

## 2010-11-17 LAB — DIFFERENTIAL
Basophils Absolute: 0.1
Basophils Relative: 1
Eosinophils Absolute: 0.1
Eosinophils Relative: 1
Lymphocytes Relative: 18
Lymphs Abs: 1.7
Monocytes Absolute: 0.5
Monocytes Relative: 5
Neutro Abs: 7.4
Neutrophils Relative %: 75

## 2010-11-17 LAB — CBC
HCT: 46.2
Hemoglobin: 16.4
MCHC: 35.6
MCV: 94
Platelets: 214
RBC: 4.91
RDW: 12.8
WBC: 9.9

## 2010-11-17 LAB — POCT I-STAT, CHEM 8
BUN: 8
Calcium, Ion: 1.16
Chloride: 102
Creatinine, Ser: 1.1
Glucose, Bld: 102 — ABNORMAL HIGH
HCT: 48
Hemoglobin: 16.3
Potassium: 3.8
Sodium: 139
TCO2: 27

## 2010-11-19 LAB — URINALYSIS, ROUTINE W REFLEX MICROSCOPIC
Glucose, UA: NEGATIVE
Ketones, ur: 15 — AB
Protein, ur: NEGATIVE
Urobilinogen, UA: 1

## 2010-11-19 LAB — COMPREHENSIVE METABOLIC PANEL
ALT: 13
Alkaline Phosphatase: 50
CO2: 27
GFR calc non Af Amer: >60
Glucose, Bld: 127 — ABNORMAL HIGH
Potassium: 3.5
Sodium: 142

## 2010-11-19 LAB — RAPID URINE DRUG SCREEN, HOSP PERFORMED
Amphetamines: NOT DETECTED
Benzodiazepines: NOT DETECTED
Cocaine: POSITIVE — AB
Opiates: NOT DETECTED
Tetrahydrocannabinol: NOT DETECTED

## 2010-11-19 LAB — ETHANOL: Alcohol, Ethyl (B): <5

## 2010-11-19 LAB — DIFFERENTIAL
Basophils Relative: 0
Eosinophils Absolute: 0.1
Monocytes Relative: 6
Neutrophils Relative %: 78 — ABNORMAL HIGH

## 2010-11-19 LAB — CBC
Hemoglobin: 15.3
RBC: 4.62

## 2010-11-19 LAB — URINE MICROSCOPIC-ADD ON

## 2010-11-24 LAB — DIFFERENTIAL
Basophils Absolute: 0
Basophils Relative: 0
Lymphocytes Relative: 26
Neutro Abs: 3.9
Neutrophils Relative %: 61

## 2010-11-24 LAB — BASIC METABOLIC PANEL
BUN: 7
Calcium: 8.8
Creatinine, Ser: 0.97
GFR calc non Af Amer: >60
Glucose, Bld: 107 — ABNORMAL HIGH
Sodium: 135

## 2010-11-24 LAB — CBC
MCHC: 34.7
Platelets: 210
RDW: 12.5

## 2010-11-27 LAB — DIFFERENTIAL
Basophils Absolute: 0
Basophils Relative: 0
Lymphocytes Relative: 24
Monocytes Absolute: 0.6
Monocytes Relative: 7
Neutro Abs: 5.5
Neutrophils Relative %: 66

## 2010-11-27 LAB — I-STAT 8, (EC8 V) (CONVERTED LAB)
Acid-Base Excess: 3 — ABNORMAL HIGH
BUN: 8
Bicarbonate: 29.4 — ABNORMAL HIGH
Chloride: 105
HCT: 49
Hemoglobin: 16.7
Operator id: 257131
Sodium: 139
pCO2, Ven: 51 — ABNORMAL HIGH

## 2010-11-27 LAB — POCT I-STAT CREATININE: Creatinine, Ser: 1

## 2010-11-27 LAB — RAPID URINE DRUG SCREEN, HOSP PERFORMED
Amphetamines: NOT DETECTED
Opiates: NOT DETECTED
Tetrahydrocannabinol: NOT DETECTED

## 2010-11-27 LAB — CBC
Hemoglobin: 16.1
RBC: 4.9

## 2010-11-27 LAB — ETHANOL: Alcohol, Ethyl (B): <5

## 2010-12-01 LAB — COMPREHENSIVE METABOLIC PANEL
Albumin: 4.2
Alkaline Phosphatase: 68
BUN: 6
Calcium: 9.7
Glucose, Bld: 112 — ABNORMAL HIGH
Potassium: 3.8
Sodium: 139
Total Protein: 7.3

## 2010-12-01 LAB — DIFFERENTIAL
Lymphs Abs: 1.5
Monocytes Absolute: 0.6
Monocytes Relative: 10
Neutro Abs: 3.8
Neutrophils Relative %: 62

## 2010-12-01 LAB — URINALYSIS, ROUTINE W REFLEX MICROSCOPIC
Glucose, UA: NEGATIVE
Hgb urine dipstick: NEGATIVE
Hgb urine dipstick: NEGATIVE
Specific Gravity, Urine: 1.031 — ABNORMAL HIGH
Specific Gravity, Urine: 1.035 — ABNORMAL HIGH
Urobilinogen, UA: 1
pH: 6

## 2010-12-01 LAB — URINE MICROSCOPIC-ADD ON

## 2010-12-01 LAB — RAPID URINE DRUG SCREEN, HOSP PERFORMED
Amphetamines: NOT DETECTED
Barbiturates: NOT DETECTED
Barbiturates: NOT DETECTED
Benzodiazepines: POSITIVE — AB
Benzodiazepines: NOT DETECTED
Cocaine: POSITIVE — AB
Opiates: NOT DETECTED
Tetrahydrocannabinol: NOT DETECTED

## 2010-12-01 LAB — I-STAT 8, (EC8 V) (CONVERTED LAB)
Acid-Base Excess: 1
BUN: 7
BUN: 10
Bicarbonate: 26.4 — ABNORMAL HIGH
Bicarbonate: 24.6 — ABNORMAL HIGH
Chloride: 105
Glucose, Bld: 124 — ABNORMAL HIGH
Operator id: 198171
Potassium: 3.7
Sodium: 137
Sodium: 139
TCO2: 26
pCO2, Ven: 41 — ABNORMAL LOW
pH, Ven: 7.387 — ABNORMAL HIGH

## 2010-12-01 LAB — CBC
Hemoglobin: 16.4
RBC: 5.02
WBC: 6.1

## 2010-12-01 LAB — POCT I-STAT CREATININE: Operator id: 198171

## 2010-12-01 LAB — ETHANOL: Alcohol, Ethyl (B): <5

## 2010-12-02 ENCOUNTER — Inpatient Hospital Stay (HOSPITAL_COMMUNITY)
Admission: AD | Admit: 2010-12-02 | Discharge: 2010-12-03 | DRG: 885 | Disposition: A | Discharge status: Home / Self Care | Payer: Medicaid Other | Source: Ambulatory Visit | Attending: Psychiatry | Admitting: Psychiatry

## 2010-12-02 ENCOUNTER — Emergency Department (HOSPITAL_COMMUNITY)
Admission: EM | Admit: 2010-12-02 | Discharge: 2010-12-02 | Disposition: A | Discharge status: Other Acute Inpatient Hospital | Payer: Medicaid Other | Source: Home / Self Care | Attending: Emergency Medicine | Admitting: Emergency Medicine

## 2010-12-02 DIAGNOSIS — R45851 Suicidal ideations: Secondary | ICD-10-CM

## 2010-12-02 DIAGNOSIS — F319 Bipolar disorder, unspecified: Principal | ICD-10-CM

## 2010-12-02 DIAGNOSIS — F1020 Alcohol dependence, uncomplicated: Secondary | ICD-10-CM

## 2010-12-02 LAB — CBC
MCHC: 36.7 g/dL — ABNORMAL HIGH (ref 30.0–36.0)
Platelets: 216 10*3/uL (ref 150–400)
RDW: 12.1 % (ref 11.5–15.5)
WBC: 8.3 10*3/uL (ref 4.0–10.5)

## 2010-12-02 LAB — COMPREHENSIVE METABOLIC PANEL
AST: 17 U/L (ref 0–37)
Albumin: 4.1 g/dL (ref 3.5–5.2)
Alkaline Phosphatase: 86 U/L (ref 39–117)
BUN: 8 mg/dL (ref 6–23)
Chloride: 101 mEq/L (ref 96–112)
Potassium: 3.8 mEq/L (ref 3.5–5.1)
Total Bilirubin: 1.1 mg/dL (ref 0.3–1.2)
Total Protein: 7.4 g/dL (ref 6.0–8.3)

## 2010-12-02 LAB — RAPID URINE DRUG SCREEN, HOSP PERFORMED
Amphetamines: NOT DETECTED
Benzodiazepines: NOT DETECTED
Opiates: NOT DETECTED
Tetrahydrocannabinol: POSITIVE — AB

## 2010-12-02 LAB — DIFFERENTIAL
Basophils Absolute: 0 10*3/uL (ref 0.0–0.1)
Basophils Relative: 0 % (ref 0–1)
Eosinophils Absolute: 0 10*3/uL (ref 0.0–0.7)
Eosinophils Relative: 1 % (ref 0–5)
Monocytes Absolute: 0.7 10*3/uL (ref 0.1–1.0)

## 2010-12-02 LAB — ETHANOL: Alcohol, Ethyl (B): <11 mg/dL (ref 0–11)

## 2010-12-03 DIAGNOSIS — F329 Major depressive disorder, single episode, unspecified: Secondary | ICD-10-CM

## 2010-12-03 LAB — URINALYSIS, ROUTINE W REFLEX MICROSCOPIC
Bilirubin Urine: NEGATIVE
Glucose, UA: NEGATIVE
Hgb urine dipstick: NEGATIVE
Specific Gravity, Urine: 1.023
Urobilinogen, UA: 1
pH: 6

## 2010-12-03 LAB — CBC
HCT: 42.1
MCV: 93.7
Platelets: 251
RDW: 12.4
WBC: 12.2 — ABNORMAL HIGH

## 2010-12-03 LAB — I-STAT 8, (EC8 V) (CONVERTED LAB)
Acid-Base Excess: 1
HCT: 45
Operator id: 277751
Potassium: 3.6
Sodium: 140
TCO2: 27
pH, Ven: 7.413 — ABNORMAL HIGH

## 2010-12-03 LAB — POCT I-STAT CREATININE
Creatinine, Ser: 1.1
Operator id: 277751

## 2010-12-03 LAB — RAPID URINE DRUG SCREEN, HOSP PERFORMED
Barbiturates: NOT DETECTED
Opiates: NOT DETECTED

## 2010-12-03 LAB — ETHANOL: Alcohol, Ethyl (B): <5

## 2010-12-03 LAB — DIFFERENTIAL
Basophils Absolute: 0.1
Basophils Relative: 1
Eosinophils Absolute: 0.6
Eosinophils Relative: 5
Neutrophils Relative %: 64

## 2010-12-07 LAB — RAPID URINE DRUG SCREEN, HOSP PERFORMED
Amphetamines: NOT DETECTED
Barbiturates: NOT DETECTED
Benzodiazepines: NOT DETECTED
Cocaine: POSITIVE — AB
Opiates: NOT DETECTED
Opiates: NOT DETECTED

## 2010-12-07 LAB — I-STAT 8, (EC8 V) (CONVERTED LAB)
BUN: 16
BUN: 9
Bicarbonate: 23.1
Bicarbonate: 23.5
Chloride: 105
Glucose, Bld: 109 — ABNORMAL HIGH
Glucose, Bld: 135 — ABNORMAL HIGH
Hemoglobin: 15.3
Operator id: 277751
Sodium: 138
pCO2, Ven: 32.1 — ABNORMAL LOW

## 2010-12-07 LAB — CBC
HCT: 42.4
Hemoglobin: 14.9
MCHC: 35.1
RDW: 12.9

## 2010-12-07 LAB — DIFFERENTIAL
Basophils Absolute: 0
Basophils Relative: 0
Eosinophils Relative: 4
Lymphocytes Relative: 37
Monocytes Absolute: 0.7

## 2010-12-07 LAB — POCT I-STAT CREATININE: Creatinine, Ser: 1.2

## 2010-12-07 LAB — ETHANOL: Alcohol, Ethyl (B): <5

## 2010-12-08 ENCOUNTER — Emergency Department (HOSPITAL_COMMUNITY)
Admission: EM | Admit: 2010-12-08 | Discharge: 2010-12-09 | Disposition: A | Discharge status: Home / Self Care | Payer: Medicaid Other | Attending: Emergency Medicine | Admitting: Emergency Medicine

## 2010-12-08 DIAGNOSIS — F319 Bipolar disorder, unspecified: Secondary | ICD-10-CM | POA: Insufficient documentation

## 2010-12-08 DIAGNOSIS — F411 Generalized anxiety disorder: Secondary | ICD-10-CM | POA: Insufficient documentation

## 2010-12-08 DIAGNOSIS — F17200 Nicotine dependence, unspecified, uncomplicated: Secondary | ICD-10-CM | POA: Insufficient documentation

## 2010-12-09 LAB — CBC
HCT: 44.1 % (ref 39.0–52.0)
Hemoglobin: 16 g/dL (ref 13.0–17.0)
MCHC: 36.3 g/dL — ABNORMAL HIGH (ref 30.0–36.0)
RBC: 4.86 MIL/uL (ref 4.22–5.81)
WBC: 7.9 10*3/uL (ref 4.0–10.5)

## 2010-12-09 LAB — DIFFERENTIAL
Basophils Absolute: 0 10*3/uL (ref 0.0–0.1)
Lymphocytes Relative: 31 % (ref 12–46)
Monocytes Absolute: 0.7 10*3/uL (ref 0.1–1.0)
Neutro Abs: 4.6 10*3/uL (ref 1.7–7.7)
Neutrophils Relative %: 59 % (ref 43–77)

## 2010-12-09 LAB — COMPREHENSIVE METABOLIC PANEL
ALT: 40 U/L (ref 0–53)
Alkaline Phosphatase: 77 U/L (ref 39–117)
BUN: 9 mg/dL (ref 6–23)
CO2: 25 mEq/L (ref 19–32)
Chloride: 100 mEq/L (ref 96–112)
GFR calc Af Amer: >90 mL/min (ref >90)
GFR calc non Af Amer: >90 mL/min (ref >90)
Glucose, Bld: 105 mg/dL — ABNORMAL HIGH (ref 70–99)
Potassium: 3.6 mEq/L (ref 3.5–5.1)
Total Bilirubin: 0.4 mg/dL (ref 0.3–1.2)

## 2010-12-09 LAB — ETHANOL: Alcohol, Ethyl (B): <11 mg/dL (ref 0–11)

## 2010-12-09 LAB — RAPID URINE DRUG SCREEN, HOSP PERFORMED: Barbiturates: NOT DETECTED

## 2010-12-10 LAB — URINALYSIS, ROUTINE W REFLEX MICROSCOPIC
Glucose, UA: NEGATIVE
Ketones, ur: NEGATIVE
Nitrite: NEGATIVE
Specific Gravity, Urine: 1.022
pH: 6

## 2010-12-10 LAB — I-STAT 8, (EC8 V) (CONVERTED LAB)
BUN: 13
Chloride: 104
Glucose, Bld: 87
Potassium: 3.5
Sodium: 139
pH, Ven: 7.369 — ABNORMAL HIGH

## 2010-12-10 LAB — DIFFERENTIAL
Basophils Absolute: 0
Eosinophils Relative: 9 — ABNORMAL HIGH
Lymphocytes Relative: 39
Lymphs Abs: 2.2
Monocytes Absolute: 0.5
Neutro Abs: 2.4

## 2010-12-10 LAB — CBC
HCT: 41.8
Hemoglobin: 14.5
Platelets: 191
WBC: 5.7

## 2010-12-10 LAB — POCT I-STAT CREATININE: Operator id: 270651

## 2010-12-10 LAB — RAPID URINE DRUG SCREEN, HOSP PERFORMED
Benzodiazepines: POSITIVE — AB
Cocaine: POSITIVE — AB
Opiates: NOT DETECTED

## 2010-12-10 LAB — ETHANOL: Alcohol, Ethyl (B): <5

## 2010-12-14 ENCOUNTER — Emergency Department (HOSPITAL_COMMUNITY)
Admission: EM | Admit: 2010-12-14 | Discharge: 2010-12-14 | Disposition: A | Discharge status: Home / Self Care | Payer: Medicaid Other | Attending: Emergency Medicine | Admitting: Emergency Medicine

## 2010-12-14 DIAGNOSIS — F319 Bipolar disorder, unspecified: Secondary | ICD-10-CM | POA: Insufficient documentation

## 2010-12-14 DIAGNOSIS — Z5900 Homelessness unspecified: Secondary | ICD-10-CM | POA: Insufficient documentation

## 2010-12-14 DIAGNOSIS — F1210 Cannabis abuse, uncomplicated: Secondary | ICD-10-CM | POA: Insufficient documentation

## 2010-12-14 DIAGNOSIS — F1410 Cocaine abuse, uncomplicated: Secondary | ICD-10-CM | POA: Insufficient documentation

## 2010-12-14 DIAGNOSIS — F17200 Nicotine dependence, unspecified, uncomplicated: Secondary | ICD-10-CM | POA: Insufficient documentation

## 2010-12-14 DIAGNOSIS — F1010 Alcohol abuse, uncomplicated: Secondary | ICD-10-CM | POA: Insufficient documentation

## 2010-12-14 LAB — RAPID URINE DRUG SCREEN, HOSP PERFORMED
Amphetamines: NOT DETECTED
Benzodiazepines: POSITIVE — AB
Opiates: NOT DETECTED

## 2010-12-14 LAB — CBC
HCT: 41.8 % (ref 39.0–52.0)
Hemoglobin: 15.1 g/dL (ref 13.0–17.0)
MCH: 32.9 pg (ref 26.0–34.0)
MCV: 91.1 fL (ref 78.0–100.0)
Platelets: 192 10*3/uL (ref 150–400)
RBC: 4.59 MIL/uL (ref 4.22–5.81)
WBC: 8.4 10*3/uL (ref 4.0–10.5)

## 2010-12-14 LAB — DIFFERENTIAL
Eosinophils Absolute: 0.2 10*3/uL (ref 0.0–0.7)
Lymphocytes Relative: 37 % (ref 12–46)
Lymphs Abs: 3.1 10*3/uL (ref 0.7–4.0)
Monocytes Relative: 10 % (ref 3–12)
Neutro Abs: 4.4 10*3/uL (ref 1.7–7.7)
Neutrophils Relative %: 52 % (ref 43–77)

## 2010-12-14 LAB — COMPREHENSIVE METABOLIC PANEL
Albumin: 3.8 g/dL (ref 3.5–5.2)
BUN: 10 mg/dL (ref 6–23)
Creatinine, Ser: 0.9 mg/dL (ref 0.50–1.35)
GFR calc Af Amer: >90 mL/min (ref >90)
Glucose, Bld: 121 mg/dL — ABNORMAL HIGH (ref 70–99)
Total Protein: 6.9 g/dL (ref 6.0–8.3)

## 2010-12-14 LAB — ETHANOL: Alcohol, Ethyl (B): <11 mg/dL (ref 0–11)

## 2010-12-15 ENCOUNTER — Emergency Department (HOSPITAL_COMMUNITY)
Admission: EM | Admit: 2010-12-15 | Discharge: 2010-12-15 | Disposition: A | Discharge status: Home / Self Care | Payer: Medicaid Other | Attending: Emergency Medicine | Admitting: Emergency Medicine

## 2010-12-15 DIAGNOSIS — R45851 Suicidal ideations: Secondary | ICD-10-CM | POA: Insufficient documentation

## 2010-12-15 DIAGNOSIS — F3130 Bipolar disorder, current episode depressed, mild or moderate severity, unspecified: Secondary | ICD-10-CM | POA: Insufficient documentation

## 2010-12-15 DIAGNOSIS — F17200 Nicotine dependence, unspecified, uncomplicated: Secondary | ICD-10-CM | POA: Insufficient documentation

## 2010-12-15 DIAGNOSIS — F411 Generalized anxiety disorder: Secondary | ICD-10-CM | POA: Insufficient documentation

## 2010-12-15 DIAGNOSIS — Z79899 Other long term (current) drug therapy: Secondary | ICD-10-CM | POA: Insufficient documentation

## 2010-12-15 LAB — ETHANOL: Alcohol, Ethyl (B): <11 mg/dL (ref 0–11)

## 2010-12-15 LAB — CBC
HCT: 42.9 % (ref 39.0–52.0)
MCHC: 35.4 g/dL (ref 30.0–36.0)
RDW: 12 % (ref 11.5–15.5)
WBC: 6.1 10*3/uL (ref 4.0–10.5)

## 2010-12-15 LAB — RAPID URINE DRUG SCREEN, HOSP PERFORMED
Amphetamines: NOT DETECTED
Barbiturates: NOT DETECTED
Cocaine: NOT DETECTED
Tetrahydrocannabinol: POSITIVE — AB

## 2010-12-15 LAB — POCT I-STAT TROPONIN I: Troponin i, poc: 0 ng/mL (ref 0.00–0.08)

## 2010-12-15 LAB — DIFFERENTIAL
Basophils Absolute: 0 10*3/uL (ref 0.0–0.1)
Basophils Relative: 1 % (ref 0–1)
Eosinophils Relative: 3 % (ref 0–5)
Lymphocytes Relative: 37 % (ref 12–46)
Neutro Abs: 3.1 10*3/uL (ref 1.7–7.7)

## 2010-12-15 LAB — BASIC METABOLIC PANEL
Calcium: 9.1 mg/dL (ref 8.4–10.5)
GFR calc Af Amer: >90 mL/min (ref >90)
GFR calc non Af Amer: >90 mL/min (ref >90)
Glucose, Bld: 105 mg/dL — ABNORMAL HIGH (ref 70–99)
Potassium: 3.6 mEq/L (ref 3.5–5.1)
Sodium: 137 mEq/L (ref 135–145)

## 2010-12-17 NOTE — Discharge Summary (Signed)
  NAMEALAN, DRUMMER NO.:  [id number]  MEDICAL RECORD NO.:  [id number]  LOCATION:  9147                          FACILITY:  BH  PHYSICIAN:  Orson Aloe, MD       DATE OF BIRTH:  [birth year]  DATE OF ADMISSION:  12/02/2010 DATE OF DISCHARGE:  12/03/2010                              DISCHARGE SUMMARY   IDENTIFYING INFORMATION:  This is a 40 year old male.  This is a voluntary admission.  HISTORY OF PRESENT ILLNESS:  [name] presented by way of our emergency room complaining of suicidal thoughts and had a knife in his possession. He had also reported some thoughts of overdose; cited his father's failing health as a stressor.  He was followed by Dr. Ladona Ridgel at [facility].  On medical screening in the emergency room, his urine drug screen was positive for cannabis.  Other parameters were unremarkable.  This is a healthy, normally-developed male who is in no acute distress.  FINDINGS:  He was admitted to the dual diagnosis unit and there presented completely denying any suicidal or homicidal thoughts; said that he felt there was a misunderstanding in the way he was questioned in the emergency room; has no intent to harm himself.  He was found to have logical thinking, good eye contact, cooperative, requesting to pursue outpatient treatment, and he attributed his previous misunderstanding to the fact that he was drinking alcohol.  His suicide risk was gauged minimal, and he was discharged to outpatient followup.  DISCHARGE PLAN:  He is to follow up with [facility] on October 12, at 8 a.m.  DISCHARGE DIAGNOSES: 1. Bipolar disorder. 2. Alcohol abuse and dependence.     Margaret A. Lorin Picket, N.P.   ______________________________ Orson Aloe, MD    MAS/MEDQ  D:  12/10/2010  T:  12/10/2010  Job:  [id number]  Electronically Signed by Kari Baars N.P. on 12/10/2010 02:41:53 PM Electronically Signed by Orson Aloe  on 12/17/2010 12:31:46  PM

## 2010-12-17 NOTE — Assessment & Plan Note (Signed)
  [name], [name] NO.:  [id number]  MEDICAL RECORD NO.:  [id number]  LOCATION:  4098                          FACILITY:  BH  PHYSICIAN:  Orson Aloe, MD       DATE OF BIRTH:  February 05, 1971  DATE OF ADMISSION:  12/02/2010 DATE OF DISCHARGE:  12/03/2010                      PSYCHIATRIC ADMISSION ASSESSMENT   IDENTIFYING INFORMATION:  This is a 40 year old male, this is an involuntary admission.  HISTORY OF PRESENT ILLNESS:  [name] presented to the [facility] ED stating that he was suicidal and attempted to kill himself on October 9 by taking an overdose of his prescription medication, but was interrupted by friends and unable to complete the attempt.  He apparently also had a knife in his possession by 1 report.  Upon admission to the emergency room, he continued to express suicidality and was asked if he had any weapons.  He admitted that his symptoms were worse when he did not take his medications, and also forgets medicines when he drinks alcohol or smokes marijuana.  He was involuntarily petitioned by Dr. Ethelda Chick in the [facility] emergency room.  Please see the discharge summary for noted findings.  We elected to discharge him the same day, and his mental status exam and discharge plan is stated in the discharge summary.     Margaret A. Lorin Picket, N.P.   ______________________________ Orson Aloe, MD    MAS/MEDQ  D:  12/10/2010  T:  12/10/2010  Job:  [id number]  Electronically Signed by Kari Baars N.P. on 12/10/2010 02:41:47 PM Electronically Signed by Orson Aloe  on 12/17/2010 12:31:52 PM

## 2011-02-02 ENCOUNTER — Encounter: Payer: Self-pay | Admitting: Emergency Medicine

## 2011-02-02 ENCOUNTER — Emergency Department (HOSPITAL_COMMUNITY)
Admission: EM | Admit: 2011-02-02 | Discharge: 2011-02-02 | Disposition: A | Discharge status: Home / Self Care | Payer: Medicaid Other | Attending: Emergency Medicine | Admitting: Emergency Medicine

## 2011-02-02 ENCOUNTER — Emergency Department (HOSPITAL_COMMUNITY): Payer: Medicaid Other

## 2011-02-02 DIAGNOSIS — R5381 Other malaise: Secondary | ICD-10-CM | POA: Insufficient documentation

## 2011-02-02 DIAGNOSIS — R0602 Shortness of breath: Secondary | ICD-10-CM | POA: Insufficient documentation

## 2011-02-02 DIAGNOSIS — R05 Cough: Secondary | ICD-10-CM | POA: Insufficient documentation

## 2011-02-02 DIAGNOSIS — J40 Bronchitis, not specified as acute or chronic: Secondary | ICD-10-CM | POA: Insufficient documentation

## 2011-02-02 DIAGNOSIS — R059 Cough, unspecified: Secondary | ICD-10-CM | POA: Insufficient documentation

## 2011-02-02 HISTORY — DX: Bipolar disorder, unspecified: F31.9

## 2011-02-02 HISTORY — DX: Anxiety disorder, unspecified: F41.9

## 2011-02-02 MED ORDER — ACETAMINOPHEN 325 MG PO TABS
650.0000 mg | ORAL_TABLET | Freq: Once | ORAL | Status: AC
  Administered 2011-02-02: 650 mg via ORAL
  Filled 2011-02-02: qty 2

## 2011-02-02 MED ORDER — DOXYCYCLINE HYCLATE 100 MG PO CAPS: 100.0000 mg | ORAL_CAPSULE | Freq: Two times a day (BID) | ORAL | Status: AC

## 2011-02-02 MED ORDER — ALBUTEROL SULFATE HFA 108 (90 BASE) MCG/ACT IN AERS
2.0000 | INHALATION_SPRAY | Freq: Four times a day (QID) | RESPIRATORY_TRACT | Status: DC
  Administered 2011-02-02: 2 via RESPIRATORY_TRACT
  Filled 2011-02-02 (×2): qty 6.7

## 2011-02-02 NOTE — ED Notes (Signed)
ZOX:[id number]<AV> Expected date:02/02/11<BR> Expected time: 9:42 AM<BR> Means of arrival:Ambulance<BR> Comments:<BR> Fever, chest wall pain

## 2011-02-02 NOTE — ED Provider Notes (Signed)
History     CSN: [id number] Arrival date & time: 02/02/2011  9:48 AM   First MD Initiated Contact with Patient 02/02/11 [phone]      Chief Complaint  Patient presents with  . flu like symptoms    patient presents complaining of productive cough, subjective fevers some body aches, and sore throat over the past 3 days. He is also had some chest wall pain and mostly pain with coughing. Patient is currently homeless, but states she was living in a hotel. Up until one day ago. He denies use of illicit drugs. He apparently is a smoker and states he drinks "occasionally" he has had no rash. No documented fevers. He's had no neck pain. Denies any open sores or drainage.  (Consider location/radiation/quality/duration/timing/severity/associated sxs/prior treatment) HPI  Past Medical History  Diagnosis Date  . Bipolar 1 disorder   . Anxiety     No past surgical history on file.  No family history on file.  History  Substance Use Topics  . Smoking status: Current Everyday Smoker -- 2.0 packs/day  . Smokeless tobacco: Not on file  . Alcohol Use: Yes      Review of Systems  All other systems reviewed and are negative.    Allergies  Review of patient's allergies indicates no known allergies.  Home Medications  No current outpatient prescriptions on file.  BP 124/82  Pulse 88  Resp 18  Physical Exam  Nursing note and vitals reviewed. Constitutional: He is oriented to person, place, and time. He appears well-developed and well-nourished. No distress.  HENT:  Head: Normocephalic and atraumatic.  Eyes: Conjunctivae and EOM are normal. Pupils are equal, round, and reactive to light.  Neck: Neck supple.  Cardiovascular: Normal rate and regular rhythm.  Exam reveals no gallop and no friction rub.   No murmur heard. Pulmonary/Chest: Breath sounds normal. He has no wheezes. He has no rales. He exhibits no tenderness.  Abdominal: Soft. Bowel sounds are normal. He exhibits no  distension. There is no tenderness. There is no rebound and no guarding.  Musculoskeletal: Normal range of motion.  Neurological: He is alert and oriented to person, place, and time. No cranial nerve deficit. Coordination normal.  Skin: Skin is warm and dry. No rash noted.  Psychiatric: He has a normal mood and affect.    ED Course  Procedures (including critical care time)  Labs Reviewed - No data to display No results found.   No diagnosis found.    MDM  Pt is seen and examined;  Initial history and physical completed.  Will follow.          Amia Rynders A. Patrica Duel, MD 02/02/11 [phone]

## 2011-02-02 NOTE — ED Notes (Signed)
Pt back from Xray. NAD.

## 2011-02-02 NOTE — ED Notes (Signed)
Pt has had a productive cough, fever, body aches, sore throat and chest wall pain and tightness since 3 days ago. He is homeless and had to sleep outside the last two nights and reports that he is exhausted. Ate a subway sandwich yesterday but not much else because he's been nauseated.

## 2011-02-09 ENCOUNTER — Emergency Department (EMERGENCY_DEPARTMENT_HOSPITAL)
Admission: EM | Admit: 2011-02-09 | Discharge: 2011-02-10 | Disposition: A | Discharge status: Other Acute Inpatient Hospital | Payer: Medicaid Other | Source: Home / Self Care | Attending: Emergency Medicine | Admitting: Emergency Medicine

## 2011-02-09 ENCOUNTER — Encounter (HOSPITAL_COMMUNITY): Payer: Self-pay | Admitting: Emergency Medicine

## 2011-02-09 DIAGNOSIS — Z8659 Personal history of other mental and behavioral disorders: Secondary | ICD-10-CM | POA: Insufficient documentation

## 2011-02-09 DIAGNOSIS — F3130 Bipolar disorder, current episode depressed, mild or moderate severity, unspecified: Secondary | ICD-10-CM | POA: Insufficient documentation

## 2011-02-09 DIAGNOSIS — R45850 Homicidal ideations: Secondary | ICD-10-CM

## 2011-02-09 DIAGNOSIS — R450 Nervousness: Secondary | ICD-10-CM | POA: Insufficient documentation

## 2011-02-09 DIAGNOSIS — Z79899 Other long term (current) drug therapy: Secondary | ICD-10-CM | POA: Insufficient documentation

## 2011-02-09 DIAGNOSIS — R45851 Suicidal ideations: Secondary | ICD-10-CM | POA: Insufficient documentation

## 2011-02-09 DIAGNOSIS — Z5900 Homelessness unspecified: Secondary | ICD-10-CM | POA: Insufficient documentation

## 2011-02-09 DIAGNOSIS — F411 Generalized anxiety disorder: Secondary | ICD-10-CM | POA: Insufficient documentation

## 2011-02-09 LAB — COMPREHENSIVE METABOLIC PANEL
AST: 17 U/L (ref 0–37)
Albumin: 4.3 g/dL (ref 3.5–5.2)
Alkaline Phosphatase: 84 U/L (ref 39–117)
BUN: 6 mg/dL (ref 6–23)
Chloride: 102 mEq/L (ref 96–112)
Potassium: 4 mEq/L (ref 3.5–5.1)
Sodium: 137 mEq/L (ref 135–145)
Total Bilirubin: 0.8 mg/dL (ref 0.3–1.2)
Total Protein: 8.1 g/dL (ref 6.0–8.3)

## 2011-02-09 LAB — RAPID URINE DRUG SCREEN, HOSP PERFORMED
Amphetamines: NOT DETECTED
Barbiturates: NOT DETECTED
Cocaine: NOT DETECTED
Opiates: NOT DETECTED
Tetrahydrocannabinol: POSITIVE — AB

## 2011-02-09 LAB — CBC
Hemoglobin: 16.8 g/dL (ref 13.0–17.0)
MCH: 32.2 pg (ref 26.0–34.0)
MCHC: 35.4 g/dL (ref 30.0–36.0)
Platelets: 231 10*3/uL (ref 150–400)
RDW: 12.3 % (ref 11.5–15.5)

## 2011-02-09 LAB — DIFFERENTIAL
Basophils Absolute: 0 10*3/uL (ref 0.0–0.1)
Basophils Relative: 0 % (ref 0–1)
Eosinophils Absolute: 0 10*3/uL (ref 0.0–0.7)
Monocytes Relative: 9 % (ref 3–12)
Neutro Abs: 5.3 10*3/uL (ref 1.7–7.7)
Neutrophils Relative %: 68 % (ref 43–77)

## 2011-02-09 LAB — ETHANOL: Alcohol, Ethyl (B): <11 mg/dL (ref 0–11)

## 2011-02-09 MED ORDER — NICOTINE 21 MG/24HR TD PT24
21.0000 mg | MEDICATED_PATCH | Freq: Every day | TRANSDERMAL | Status: DC
  Administered 2011-02-09: 21 mg via TRANSDERMAL
  Filled 2011-02-09: qty 1

## 2011-02-09 MED ORDER — ONDANSETRON HCL 4 MG PO TABS: 4.0000 mg | ORAL_TABLET | Freq: Three times a day (TID) | ORAL | Status: DC | PRN

## 2011-02-09 MED ORDER — ZOLPIDEM TARTRATE 5 MG PO TABS: 5.0000 mg | ORAL_TABLET | Freq: Every evening | ORAL | Status: DC | PRN

## 2011-02-09 MED ORDER — ALUM & MAG HYDROXIDE-SIMETH 200-200-20 MG/5ML PO SUSP: 30.0000 mL | ORAL | Status: DC | PRN

## 2011-02-09 MED ORDER — ACETAMINOPHEN 325 MG PO TABS: 650.0000 mg | ORAL_TABLET | ORAL | Status: DC | PRN

## 2011-02-09 MED ORDER — RISPERIDONE 2 MG PO TABS
2.0000 mg | ORAL_TABLET | Freq: Every day | ORAL | Status: DC
  Administered 2011-02-09 – 2011-02-10 (×2): 2 mg via ORAL
  Filled 2011-02-09 (×2): qty 1

## 2011-02-09 MED ORDER — LORAZEPAM 1 MG PO TABS: 1.0000 mg | ORAL_TABLET | Freq: Three times a day (TID) | ORAL | Status: DC | PRN

## 2011-02-09 NOTE — ED Provider Notes (Signed)
History     CSN: [id number] Arrival date & time: 02/09/2011 11:49 AM   First MD Initiated Contact with Patient 02/09/11 1319      Chief Complaint  Patient presents with  . Medical Clearance    (Consider location/radiation/quality/duration/timing/severity/associated sxs/prior treatment) HPI History is obtained from the patient. [name] presents for medical clearance. He has a past medical history of schizophrenia, anxiety, and bipolar 1 disorder. States that he normally takes [place] and Klonopin at home, but has been out of both for about 3 weeks. He presents from [facility] for evaluation and placement. He states that he has had some homicidal ideation. His mothers boyfriend took out a restraining order against him yesterday, which has made him very upset,. He states "if I was able to get my hands on the gun, I would shoot him." Also admits some suicidal ideation, but has no specific plan. He admits to occasional alcohol use and use of cannabis, but denies other street drug use. He is currently homeless, and lives in a motel.  Past Medical History  Diagnosis Date  . Bipolar 1 disorder   . Anxiety     Past Surgical History  Procedure Date  . Finger surgery     History reviewed. No pertinent family history.  History  Substance Use Topics  . Smoking status: Current Everyday Smoker -- 2.0 packs/day  . Smokeless tobacco: Not on file  . Alcohol Use: Yes      Review of Systems  Constitutional: Negative.   HENT: Negative for congestion and rhinorrhea.   Eyes: Negative.   Respiratory: Negative for chest tightness and shortness of breath.   Cardiovascular: Negative for chest pain and palpitations.  Gastrointestinal: Negative.   Musculoskeletal: Negative for myalgias.  Skin: Negative for rash.  Neurological: Negative for dizziness, weakness and headaches.  Psychiatric/Behavioral: Positive for suicidal ideas. Negative for hallucinations, confusion, self-injury, decreased  concentration and agitation. The patient is nervous/anxious.     Allergies  Review of patient's allergies indicates no known allergies.  Home Medications   Current Outpatient Rx  Name Route Sig Dispense Refill  . HYDROXYZINE HCL 50 MG PO TABS Oral Take 50 mg by mouth 2 (two) times daily.      Marland Kitchen DOXYCYCLINE HYCLATE 100 MG PO CAPS Oral Take 1 capsule (100 mg total) by mouth 2 (two) times daily. 14 capsule 0  . PALIPERIDONE 3 MG PO TB24 Oral Take 3 mg by mouth daily.        There were no vitals taken for this visit.  Physical Exam  Nursing note and vitals reviewed. Constitutional: He is oriented to person, place, and time. He appears well-developed and well-nourished. No distress.  HENT:  Head: Normocephalic and atraumatic.  Right Ear: External ear normal.  Left Ear: External ear normal.  Nose: Nose normal.  Eyes: Conjunctivae are normal. Pupils are equal, round, and reactive to light.  Neck: Normal range of motion.  Cardiovascular: Normal rate, regular rhythm and normal heart sounds.   Pulmonary/Chest: Effort normal and breath sounds normal. No respiratory distress.  Abdominal: Soft. There is no tenderness.  Musculoskeletal: Normal range of motion.  Neurological: He is alert and oriented to person, place, and time.  Skin: Skin is warm and dry. He is not diaphoretic.  Psychiatric: His speech is normal and behavior is normal. He exhibits a depressed mood. He expresses homicidal ideation.       Poor eye contact    ED Course  Procedures (including critical care time)  Labs  Reviewed  COMPREHENSIVE METABOLIC PANEL - Abnormal; Notable for the following:    Glucose, Bld 119 (*)    All other components within normal limits  URINE RAPID DRUG SCREEN (HOSP PERFORMED) - Abnormal; Notable for the following:    Tetrahydrocannabinol POSITIVE (*)    All other components within normal limits  ETHANOL  CBC  DIFFERENTIAL   No results found.   No diagnosis found.    MDM  Pt  medically screened and cleared to move to psych ED for further eval/tx. ACT aware.        Grant Fontana, [place] 02/09/11 1930

## 2011-02-09 NOTE — ED Notes (Signed)
Pt states he does not want to discuss why he is here since he has already discussed it with others.  Pt finally states that he is having suicidal thoughts and homicidal thoughts towards his mother's boyfriend.  Plan is to shoot him and then himself.

## 2011-02-09 NOTE — ED Notes (Signed)
Pt states that his mom's boyfriend took a restraining order out on him yesterday and now he is suicidal and homicidal towards him. He came from [place]. He has been of his medication, Invega and Klonipin, for 3 weeks now as well. He came from [place].

## 2011-02-09 NOTE — BH Assessment (Signed)
Assessment Note   [name] is an 40 y.o. male. Pt was sent over from [facility] under IVC. He is having Auditory Hallucinations with command and visual Hallucinations and was dx with Bipolar Disorder. He is currently not taking his medications. He reports having suicidal thoughts with plan with walking into traffic. He is having thoughts almost constantly since yesterday. He was issued a 50-B order yesterday to stay away from his mother and her boyfriend. Patient reports that he will shoot his mothers boyfriend if he gets a gun. It was reported on IVC paperwork that he has contacted someone about purchasing a gun, but he does not have the money. He noted that when he gets the money he stated that he will kill him. Pt does have history of inpatient treatment for MH and detox. The last admission was Oct 2012 at [facility] for [facility]. Patient was not willing to share information, however after many attempts he noted the information above.   Writer consulted with Dr. Benjiman Core MD who agrees that patient requires inpatient treatment for stabilization to further prevent harm to self and others.    Axis I: Bipolar, Depressed Axis II: Deferred Axis III:  Past Medical History  Diagnosis Date  . Bipolar 1 disorder   . Anxiety    Axis IV: economic problems, housing problems, problems related to social environment and problems with primary support group Axis V: 21-30 behavior considerably influenced by delusions or hallucinations OR serious impairment in judgment, communication OR inability to function in almost all areas  Past Medical History:  Past Medical History  Diagnosis Date  . Bipolar 1 disorder   . Anxiety     Past Surgical History  Procedure Date  . Finger surgery     Family History: History reviewed. No pertinent family history.  Social History:  reports that he has been smoking.  He does not have any smokeless tobacco history on file. He reports that he drinks alcohol. He reports that  he uses illicit drugs (Marijuana).  Additional Social History:  Alcohol / Drug Use Pain Medications: See attachment Prescriptions: See attachment Over the Counter: None reported History of alcohol / drug use?: Yes Substance #1 Name of Substance 1: ETOH 1 - Age of First Use: 6-75yrs 1 - Amount (size/oz): unknown 1 - Frequency: unknown 1 - Duration: unknown 1 - Last Use / Amount: unknown Allergies: No Known Allergies  Home Medications:  Medications Prior to Admission  Medication Dose Route Frequency Provider Last Rate Last Dose  . acetaminophen (TYLENOL) tablet 650 mg  650 mg Oral Q4H PRN Grant Fontana, PA      . alum & mag hydroxide-simeth (MAALOX/MYLANTA) 200-200-20 MG/5ML suspension 30 mL  30 mL Oral PRN Grant Fontana, PA      . LORazepam (ATIVAN) tablet 1 mg  1 mg Oral Q8H PRN Grant Fontana, PA      . nicotine (NICODERM CQ - dosed in mg/24 hours) patch 21 mg  21 mg Transdermal Daily Grant Fontana, [place]   21 mg at 02/09/11 1638  . ondansetron (ZOFRAN) tablet 4 mg  4 mg Oral Q8H PRN Grant Fontana, PA      . risperiDONE (RISPERDAL) tablet 2 mg  2 mg Oral Daily Grant Fontana, [place]   2 mg at 02/09/11 1638  . zolpidem (AMBIEN) tablet 5 mg  5 mg Oral QHS PRN Grant Fontana, PA       Medications Prior to Admission  Medication Sig Dispense Refill  . doxycycline (VIBRAMYCIN) 100  MG capsule Take 1 capsule (100 mg total) by mouth 2 (two) times daily.  14 capsule  0  . paliperidone (INVEGA) 3 MG 24 hr tablet Take 3 mg by mouth daily.          OB/GYN Status:  No LMP for male patient.  General Assessment Data Location of Assessment: [facility] ED ACT Assessment: Yes Living Arrangements: Parent;Homeless Can pt return to current living arrangement?: Yes Admission Status: Involuntary Is patient capable of signing voluntary admission?: Yes Transfer from: [facility] Referral Source: [facility]  Education Status Is patient currently in school?: No Current Grade:  n/a Highest grade of school patient has completed: n/a Name of school: n/a Contact person: n/a  Risk to self Suicidal Ideation: Yes-Currently Present Suicidal Intent: Yes-Currently Present Is patient at risk for suicide?: Yes Suicidal Plan?: Yes-Currently Present Specify Current Suicidal Plan: walking into the highway Access to Means: Yes Specify Access to Suicidal Means:  (can walk in traffic) What has been your use of drugs/alcohol within the last 12 months?: unkn Previous Attempts/Gestures: Yes How many times?: 3  Other Self Harm Risks:  (unknown) Triggers for Past Attempts: Family contact;Other (Comment) (homeless, worthless, hopeless, depression) Intentional Self Injurious Behavior: None Family Suicide History: Unknown Recent stressful life event(s): Other (Comment);Financial Problems (Homeless) Persecutory voices/beliefs?: Yes Depression: Yes Depression Symptoms: Feeling angry/irritable;Feeling worthless/self pity;Isolating;Loss of interest in usual pleasures Substance abuse history and/or treatment for substance abuse?: No Suicide prevention information given to non-admitted patients: Not applicable  Risk to Others Homicidal Ideation: Yes-Currently Present Thoughts of Harm to Others: Yes-Currently Present Comment - Thoughts of Harm to Others: Mother boyfriend Current Homicidal Intent: Yes-Currently Present Current Homicidal Plan: Yes-Currently Present Describe Current Homicidal Plan: if he had a gun he would shot the mothers boyfriend Access to Homicidal Means: No Identified Victim: Bary Richard History of harm to others?: No Assessment of Violence: None Noted Violent Behavior Description: pt is isolated but calm Does patient have access to weapons?: No Criminal Charges Pending?: No Does patient have a court date: No  Psychosis Hallucinations: Auditory;With command Delusions: None noted  Mental Status Report Appear/Hygiene: Bizarre Eye Contact: Other (Comment)  (pt would not make eye contact ) Motor Activity: Freedom of movement Speech: Unable to assess Level of Consciousness: Irritable Mood: Irritable Affect: Blunted;Irritable Anxiety Level: None Thought Processes: Irrelevant Judgement: Impaired Orientation: Person Obsessive Compulsive Thoughts/Behaviors: None  Cognitive Functioning Concentration: Normal Memory: Recent Intact;Remote Intact IQ: Average Insight: Poor Impulse Control: Poor Appetite: Fair Weight Loss: 0  Weight Gain: 0  Sleep: Decreased Total Hours of Sleep: 4  Vegetative Symptoms: None  Prior Inpatient Therapy Prior Inpatient Therapy: Yes Prior Therapy Dates: June/2010, Oct 2012 Prior Therapy Facilty/Provider(s): [facility] Reason for Treatment: Bipolar/ Detox  Prior Outpatient Therapy Prior Outpatient Therapy: Yes Prior Therapy Dates: 2008-present Prior Therapy Facilty/Provider(s): [facility] Reason for Treatment: MH          Abuse/Neglect Assessment (Assessment to be complete while patient is alone) Physical Abuse: Denies Verbal Abuse: Denies Sexual Abuse: Denies Exploitation of patient/patient's resources: Denies Self-Neglect: Denies Values / Beliefs Cultural Requests During Hospitalization: None Spiritual Requests During Hospitalization: None Consults Spiritual Care Consult Needed: No Social Work Consult Needed: No      Additional Information 1:1 In Past 12 Months?: No CIRT Risk: No Elopement Risk: No Does patient have medical clearance?: Yes     Disposition: Please run patient for adult admission.  Disposition Disposition of Patient: Referred to [facility]) Patient referred to: Other (Comment) [facility])  On Site Evaluation  by:   Reviewed with Physician:     Shara Blazing Denae 02/09/2011 8:31 PM

## 2011-02-09 NOTE — ED Notes (Signed)
Pt is new with [facility] and they state that he should be taken off of his Klonipin that was prescribed by [facility] because his psychiatrists noted substance abuse activities and noncompliance. [facility] does not have to be notified of his care but they may contacted. His ACT team's # is [phone]. Crisis Line is [phone]

## 2011-02-10 ENCOUNTER — Inpatient Hospital Stay (HOSPITAL_COMMUNITY)
Admission: EM | Admit: 2011-02-10 | Discharge: 2011-02-12 | DRG: 885 | Disposition: A | Discharge status: Home / Self Care | Payer: Medicaid Other | Source: Ambulatory Visit | Attending: Psychiatry | Admitting: Psychiatry

## 2011-02-10 ENCOUNTER — Encounter (HOSPITAL_COMMUNITY): Payer: Self-pay | Admitting: Behavioral Health

## 2011-02-10 DIAGNOSIS — F1320 Sedative, hypnotic or anxiolytic dependence, uncomplicated: Secondary | ICD-10-CM

## 2011-02-10 DIAGNOSIS — F609 Personality disorder, unspecified: Secondary | ICD-10-CM

## 2011-02-10 DIAGNOSIS — R45850 Homicidal ideations: Secondary | ICD-10-CM

## 2011-02-10 DIAGNOSIS — F319 Bipolar disorder, unspecified: Principal | ICD-10-CM

## 2011-02-10 DIAGNOSIS — J40 Bronchitis, not specified as acute or chronic: Secondary | ICD-10-CM

## 2011-02-10 DIAGNOSIS — Z560 Unemployment, unspecified: Secondary | ICD-10-CM

## 2011-02-10 DIAGNOSIS — F1210 Cannabis abuse, uncomplicated: Secondary | ICD-10-CM

## 2011-02-10 DIAGNOSIS — Z79899 Other long term (current) drug therapy: Secondary | ICD-10-CM

## 2011-02-10 MED ORDER — PALIPERIDONE ER 6 MG PO TB24
6.0000 mg | ORAL_TABLET | Freq: Every day | ORAL | Status: DC
  Administered 2011-02-10 – 2011-02-12 (×3): 6 mg via ORAL
  Filled 2011-02-10 (×4): qty 1
  Filled 2011-02-10 (×2): qty 2

## 2011-02-10 MED ORDER — IBUPROFEN 800 MG PO TABS
800.0000 mg | ORAL_TABLET | Freq: Once | ORAL | Status: AC
  Administered 2011-02-10: 800 mg via ORAL
  Filled 2011-02-10: qty 1

## 2011-02-10 MED ORDER — PALIPERIDONE ER 6 MG PO TB24
6.0000 mg | ORAL_TABLET | Freq: Every day | ORAL | Status: DC
  Administered 2011-02-10: 6 mg via ORAL
  Filled 2011-02-10: qty 1

## 2011-02-10 MED ORDER — CLONAZEPAM 0.5 MG PO TABS
0.5000 mg | ORAL_TABLET | Freq: Every day | ORAL | Status: DC
  Administered 2011-02-10 – 2011-02-11 (×2): 0.5 mg via ORAL
  Filled 2011-02-10 (×2): qty 1

## 2011-02-10 MED ORDER — ACETAMINOPHEN 325 MG PO TABS
650.0000 mg | ORAL_TABLET | Freq: Four times a day (QID) | ORAL | Status: DC | PRN
  Administered 2011-02-11: 650 mg via ORAL

## 2011-02-10 MED ORDER — MAGNESIUM HYDROXIDE 400 MG/5ML PO SUSP: 30.0000 mL | Freq: Every day | ORAL | Status: DC | PRN

## 2011-02-10 MED ORDER — CLONAZEPAM 0.5 MG PO TABS
0.5000 mg | ORAL_TABLET | Freq: Every day | ORAL | Status: DC
  Administered 2011-02-10: 0.5 mg via ORAL
  Filled 2011-02-10: qty 1

## 2011-02-10 MED ORDER — INFLUENZA VIRUS VACC SPLIT PF IM SUSP
0.5000 mL | INTRAMUSCULAR | Status: AC
  Administered 2011-02-11: 0.5 mL via INTRAMUSCULAR

## 2011-02-10 MED ORDER — ALUM & MAG HYDROXIDE-SIMETH 200-200-20 MG/5ML PO SUSP: 30.0000 mL | ORAL | Status: DC | PRN

## 2011-02-10 MED ORDER — PNEUMOCOCCAL VAC POLYVALENT 25 MCG/0.5ML IJ INJ
0.5000 mL | INJECTION | INTRAMUSCULAR | Status: AC
  Administered 2011-02-11: 0.5 mL via INTRAMUSCULAR

## 2011-02-10 MED ORDER — ZOLPIDEM TARTRATE 5 MG PO TABS: 5.0000 mg | ORAL_TABLET | Freq: Every evening | ORAL | Status: DC | PRN

## 2011-02-10 MED ORDER — NICOTINE 21 MG/24HR TD PT24
21.0000 mg | MEDICATED_PATCH | Freq: Every day | TRANSDERMAL | Status: DC
  Administered 2011-02-10 – 2011-02-12 (×3): 21 mg via TRANSDERMAL
  Filled 2011-02-10 (×6): qty 1

## 2011-02-10 NOTE — Progress Notes (Signed)
Pt was in bed on first encounter, but was easily awakened.  He requested juice or gatorade which was given.  He made no requests to this Clinical research associate.  He denies self-harm thoughts, but still has bad thoughts toward mother's boyfriend.  He has been non-compliant with his meds since his discharge from [facility] in October.  Pt has no meds scheduled this evening.  Safety maintained with q15 minute checks.

## 2011-02-10 NOTE — ED Provider Notes (Signed)
  Physical Exam  BP 98/62  Pulse 70  Temp(Src) 97.8 F (36.6 C) (Oral)  Resp 18  SpO2 99%  Physical Exam  ED Course  Procedures  MDM Awake and alert, no acute distress.  C/o migraine headache, motrin ordered.      Glynn Octave, MD 02/10/11 0800

## 2011-02-10 NOTE — ED Notes (Signed)
Accepted at behavioral health.  Glynn Octave, MD 02/10/11 [phone]

## 2011-02-10 NOTE — BH Assessment (Signed)
Discussed pt's disposition with Dr. Eulogio Ditch. He sts that he will accept pt to [facility]. Contacted Kelly at [facility] and informed her that Dr. Rogers Blocker has accepted patient. She later called back with pt's bed assignment ([facility]). The EDP (Dr. Lajean Saver) was made aware of pt's disposition and will discharge pt from [facility] accordingly. Pt's nurse -Kathlene November was informed of pt's disposition and will complete nurse to nurse report prior to discharge from [facility]. All other arrangements such as transportation will also be completed by Kathlene November. Writer has completed all of pt's support paperwork and faxed to [facility] so that it is logged.  Melynda Ripple, MS (Assessment Counselor)

## 2011-02-10 NOTE — ED Provider Notes (Signed)
Medical screening examination/treatment/procedure(s) were performed by non-physician practitioner and as supervising physician I was immediately available for consultation/collaboration.  Shane Badeaux R. Dalal Livengood, MD 02/10/11 0022 

## 2011-02-10 NOTE — Consult Note (Signed)
Patient Identification:  [name] Date of Evaluation:  02/10/2011   History of Present Illness:  I saw the patient and reviewed the assessment and the medical records.  40 year old Caucasian male who has a history of  marijuana and alcohol abuse reported depressed mood and having suicidal ideations along with AVH hallucinations. He is currently not taking his medications. He reports having suicidal thoughts with plan to walk into traffic. He is having thoughts almost constantly since yesterday. He was issued a 50-B order yesterday to stay away from his mother and her boyfriend.   Patient reports that he will shoot his mothers boyfriend if he gets a gun.Patient is very logical and goal-directed during interview does not seem to be hallucinating or delusional.   Patient Labs within normal limits his UDS is positive for marijuana he reported using marijuana on and off.   Patient isn't working is on invega 3 mg by mouth daily along with Klonopin 0.5 mg by mouth daily I discussed with him about increasing invega to 6 mg, patient agreed with it.   PHYSICIAN: Orson Aloe, MD DATE OF BIRTH: [birth year]  DATE OF ADMISSION: 12/02/2010  DATE OF DISCHARGE: 12/03/2010  DISCHARGE SUMMARY   IDENTIFYING INFORMATION: This is a 40 year old male. This is a  voluntary admission.   HISTORY OF PRESENT ILLNESS: [name] presented by way of our emergency  room complaining of suicidal thoughts and had a knife in his possession.  He had also reported some thoughts of overdose; cited his father's  failing health as a stressor. He was followed by Dr. Ladona Ridgel at [facility]. On medical screening in the emergency room, his urine  drug screen was positive for cannabis. Other parameters were  unremarkable. This is a healthy, normally-developed male who is in no  acute distress.   FINDINGS: He was admitted to the dual diagnosis unit and there  presented completely denying any suicidal or homicidal  thoughts; said  that he felt there was a misunderstanding in the way he was questioned  in the emergency room; has no intent to harm himself. He was found to  have logical thinking, good eye contact, cooperative, requesting to  pursue outpatient treatment, and he attributed his previous  misunderstanding to the fact that he was drinking alcohol. His suicide  risk was gauged minimal, and he was discharged to outpatient followup.   DISCHARGE PLAN: He is to follow up with [facility] on  October 12, at 8 a.m.   DISCHARGE DIAGNOSES:  1. Bipolar disorder.  2. Alcohol abuse and dependence.      Past Medical History:     Past Medical History  Diagnosis Date  . Bipolar 1 disorder   . Anxiety        Past Surgical History  Procedure Date  . Finger surgery     Filed Vitals:   02/10/11 0549  BP: 98/62  Pulse: 70  Temp: 97.8 F (36.6 C)  Resp: 18    Lab Results:   BMET    Component Value Date/Time   NA 137 02/09/2011 1254   K 4.0 02/09/2011 1254   CL 102 02/09/2011 1254   CO2 21 02/09/2011 1254   GLUCOSE 119* 02/09/2011 1254   BUN 6 02/09/2011 1254   CREATININE 0.80 02/09/2011 1254   CALCIUM 9.8 02/09/2011 1254   GFRNONAA >90 02/09/2011 1254   GFRAA >90 02/09/2011 1254    Allergies: No Known Allergies  Current Medications:  Prior to Admission medications  Medication Sig Start Date End Date Taking? Authorizing Provider  hydrOXYzine (ATARAX/VISTARIL) 50 MG tablet Take 50 mg by mouth 2 (two) times daily.     Yes Historical Provider, MD  doxycycline (VIBRAMYCIN) 100 MG capsule Take 1 capsule (100 mg total) by mouth 2 (two) times daily. 02/02/11 02/12/11  Peter A. Tucich, MD  paliperidone (INVEGA) 3 MG 24 hr tablet Take 3 mg by mouth daily.      Historical Provider, MD    Social History:    reports that he has been smoking.  He does not have any smokeless tobacco history on file. He reports that he drinks alcohol. He reports that he uses illicit drugs  (Marijuana).   Family History:    History reviewed. No pertinent family history.   DIAGNOSIS:   AXIS I  mood disorder NOS   AXIS II  Deffered  AXIS III See medical notes.  AXIS IV  conflict with the family   AXIS V 40     Recommendations:  Patient need inpatient stabilization.   Eulogio Ditch, MD

## 2011-02-11 MED ORDER — DOXYCYCLINE HYCLATE 100 MG PO CAPS: 100.0000 mg | ORAL_CAPSULE | Freq: Two times a day (BID) | ORAL | Status: DC

## 2011-02-11 MED ORDER — DOXYCYCLINE HYCLATE 100 MG PO TABS
100.0000 mg | ORAL_TABLET | Freq: Two times a day (BID) | ORAL | Status: DC
  Administered 2011-02-12: 100 mg via ORAL
  Filled 2011-02-11 (×2): qty 1
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
## 2011-02-11 NOTE — Progress Notes (Signed)
Pt was moved back to [address], pt not here for detox. Pt talked in treatment team about how he still feels HI towards his Mom's boyfriend (who took out a 50-B against pt) Pt told team that he had stayed in [place] a week and a half ago. Pt made it to 10th grade in school, is currently homeless and stays in motels. Cooperative, no behavior problem.
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
## 2011-02-11 NOTE — H&P (Signed)
Pt seen and evaluated upon admission.  See full H&P/PA.  Completed Admission Suicide Risk Assessment.  See orders.  Pt agreeable with plan.  Discussed with team.   

## 2011-02-11 NOTE — H&P (Signed)
  Identifying information: This is a 40 year old Caucasian male this is an voluntary admission.  History of present illness:  [name] presented by way of our emergency room complaining of homicidal thoughts towards his mother's boyfriend. They have restraining order has been taken off against him to protect mother. He has endorsed 80 plan to worship the boyfriend if he had access to weapons and to get back. He was seen in the emergency room where he was started on an increased dose of the intake at 6 mg daily, he been taking 3 mg at home.  Past psychiatric history:  Most recently he was here in October of 2012 he is history of alcohol abuse cannabis abuse. He was noted to be positive for cocaine about 2 years ago when he was in her system. Cognitive features contributing to risk include impulsivity, decreased executive function, and limited insight. He has a history of being diagnosed with bipolar disorder. Medication compliance is unclear.  Social history:  This is a single unemployed male who completed the 10th grade. Says he does not want to end up in prison. Although he admits to having homicidal thoughts. He is voiced his objections to programming for substance abuse and is not interested in NA or AA or any followup programs. He is on disability for bipolar affective disorder.   Current Mental Status: Patient seen and evaluated in treatment team. Chart reviewed. Patient stated that his mood was "not good". His affect was mood congruent and irritable. He denied any current thoughts of self injurious behavior and suicidal ideation, yet endorsed homicidal ideation towards his mother's boyfriend. Said he would "pop a cap in him if he could." He has been argumentative on unit. Endorsed hx of BPAD. He denied any significant depressive signs or symptoms at this time. There were no auditory or visual hallucinations, paranoia, delusional thought processes, or mania noted. Thought process was linear and goal  directed. Slight psychomotor agitation noted. His speech was normal rate, tone and volume. Eye contact was good. Judgment and insight are very poor. Nevertheless, he was able to contract to keep himself, the pts and staff here safe. Targeting mother's BF. Completed 10th grade. Limited cognition.    Current medications:  Invega 3 mg each bedtime.  Drug allergies are none.  Medical Evaluation:  Full physical exam was done in the emergency room and as noted the record this is a normally developed male in no acute distress pleasant cooperative normally developed and adequately hydrated. He is seen at [facility] were noted to be normal in the emergency room urine drug screen was positive for cannabis. Liver enzymes were normal. Alcohol screen negative. I've medically and physically examined as patient today note no abnormal movements and my findings are consistent with those in the emergency room.  Admitting diagnosis:  Axis I bipolar disorder mixed state. Cannabis abuse.  Axis II: Rule out personality disorder NOS with cluster B. features. Axis III: No diagnosis axis Axis IV: Family domestic stressors.  Axis V: Current 30 past year not known  Plan:  His Invega was increased to 6 mg each bedtime in the emergency room we'll continue that plan at this time. We're transferring him to her mood disorders program and he is in agreement with the plan.

## 2011-02-11 NOTE — Discharge Planning (Signed)
Met with [name] during treatment team.  Was staying in a hotel.  Asked for help in identifying affordable place to live.  Told him I could get him a list of boarding houses.  Sounded good to him.  Will follow up at [facility].  Has court next week related to 50-b that mom took out on him based on threats he made to kill her boyfriend.

## 2011-02-12 DIAGNOSIS — F319 Bipolar disorder, unspecified: Principal | ICD-10-CM

## 2011-02-12 LAB — HEPATITIS PANEL, ACUTE
HCV Ab: NEGATIVE
Hep A IgM: NEGATIVE
Hep B C IgM: NEGATIVE
Hepatitis B Surface Ag: NEGATIVE

## 2011-02-12 NOTE — Progress Notes (Signed)
[facility] Group Notes: (Counselor/Nursing/MHT/Case Management/Adjunct)   Type of Therapy:  Group Therapy  Participation Level:  Limited  Participation Quality:    Affect:  Attentive  Cognitive:  Appropriate  Insight:  None  Engagement in Group: None  Engagement in Therapy:  None  Modes of Intervention:  Support and Exploration  Summary of Progress/Problems: [name]  was attentive but not engaged in group process    [name] 02/12/2011  3:51 PM

## 2011-02-12 NOTE — Progress Notes (Signed)
Patient ID: [name], male   DOB: [birth year], 40 y.o.   MRN: [id number] Pt is asleep in bed this AM. Pt states that he did not sleep well last night. Pt avoids attending groups when he can. Writer reinforced expectations while at [facility]. Pt denies SI/HI and is cooperative with staff. Pt is isolative and forwards little. Pt states that he is concerned about where he will go after discharge. Writer will continue to monitor.

## 2011-02-12 NOTE — Progress Notes (Signed)
Suicide Risk Assessment  Discharge Assessment     Demographic factors:  Assessment Details Time of Assessment: Admission Information Obtained From: Patient Current Mental Status:  Current Mental Status: Thoughts of violence towards others;Plan to harm others;Intention to act on plan to harm others Risk Reduction Factors:  Risk Reduction Factors: Religious beliefs about death  CLINICAL FACTORS:   Severe Anxiety and/or Agitation Depression:   Comorbid alcohol abuse/dependence Hopelessness Alcohol/Substance Abuse/Dependencies Previous Psychiatric Diagnoses and Treatments  COGNITIVE FEATURES THAT CONTRIBUTE TO RISK:  No Cognitive risk factors noted.   SUICIDE RISK:   Minimal: No identifiable suicidal ideation.  Patients presenting with no risk factors but with morbid ruminations; may be classified as minimal risk based on the severity of the depressive symptoms  Patient denies suicidal or homicidal ideation, hallucinations, illusions, or delusions. Patient engages with good eye contact, is able to focus adequately in a one to one setting, and has clear goal directed thoughts. Patient speaks with a natural conversational volume, rate, and tone. Anxiety was reported at 2 on a scale of 1 the least and 10 the most. Depression was reported at 2 on the same scale. Patient is oriented times 4, recent and remote memory intact. Judgement: Improved from admission Insight: intact, has learned that Klonopin causes memory loss and to maybe say stuff that he later regrets Plan: Remain on . doxycycline  100 mg Oral BID  . influenza  inactive virus vaccine  0.5 mL Intramuscular Tomorrow-1000  . nicotine  21 mg Transdermal Daily  . paliperidone  6 mg Oral Daily  . pneumococcal 23 valent vaccine  0.5 mL Intramuscular Tomorrow-1000  Because Vibramycin is for infection, nicotine patch helps smoking cravings, Hinda Glatter is for thoughts and bipolar disorder,  Pt voices understanding of the risks and  benefits of medication. Follow-up is with [facility] on Wed between 8 and 10 AM. Yoselyn Mcglade 02/12/2011, 4:27 PM
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
## 2011-02-12 NOTE — Progress Notes (Signed)
Patient ID: [name], male   DOB: October 21, 1970, 40 y.o.   MRN: [id number]  Spoke to patient briefly this evening. Patient did not attend group. Patient had scheduled doxycycline this evening, had to awaken patient to give medication. Patient refused antibiotic at this time stating that he will start it tomorrow. Patient stated that he wanted to go back to sleep. Patient has been resting quietly. Respirations even, normal and unlabored.

## 2011-02-12 NOTE — Discharge Summary (Signed)
Discharge Note  Patient:  [name] is an 40 y.o., male DOB:  23-Jul-1970  Date of Admission:  02/10/2011  Date of Discharge:  02/12/2011  Level of Care:  OP  Discharge destination:  HOME  Is patient on multiple antipsychotic therapies at discharge:  NO  Patient phone:  [phone] (home) Patient address:   [address] [place] [address]  The patient received suicide prevention pamphlet:  YES Belongings returned:  Judene Companion, Israel Werts 02/12/2011,4:33 PM

## 2011-02-12 NOTE — Progress Notes (Signed)
Patient ID: [name], male   DOB: August 10, 1970, 40 y.o.  MRN: [id number] Writer reviewed d/c instructions with pt including medications, follow up appointments and crisis intervention. Pt verbally acknowledged understanding of instructions. Pt denies SI/HI and A/V hallucinations and states that he has no reservations about leaving [facility] at this time. Pt belongings returned from locker and pt released into his own care.

## 2011-02-12 NOTE — Progress Notes (Signed)
Patient seen to assess for discharge planning needs.  Patient advised of doing well and ready to discharge home today.  He denies SI/HI and rates depression at two.  He reports having a place to go for discharge and was provided a bus pass.  Patient assisted with indigent medications.  Suicide Prevention education reviewed.  Patient advised to walk into [place] [facility] on 02/19/11 due to not being able to get a appointment this late in the afternoon.  Barrier to discharge is lack of permanent housing and limited support system.

## 2011-02-15 NOTE — Progress Notes (Signed)
Patient Discharge Instructions: no consents to [facility], [name], 02/15/2011, 1:57 PM

## 2011-02-17 ENCOUNTER — Other Ambulatory Visit: Payer: Self-pay

## 2011-02-17 ENCOUNTER — Encounter (HOSPITAL_COMMUNITY): Payer: Self-pay | Admitting: Emergency Medicine

## 2011-02-17 ENCOUNTER — Emergency Department (HOSPITAL_COMMUNITY)
Admission: EM | Admit: 2011-02-17 | Discharge: 2011-02-18 | Disposition: A | Discharge status: Home / Self Care | Payer: Medicaid Other | Attending: Emergency Medicine | Admitting: Emergency Medicine

## 2011-02-17 DIAGNOSIS — R0602 Shortness of breath: Secondary | ICD-10-CM | POA: Insufficient documentation

## 2011-02-17 DIAGNOSIS — F411 Generalized anxiety disorder: Secondary | ICD-10-CM | POA: Insufficient documentation

## 2011-02-17 DIAGNOSIS — F22 Delusional disorders: Secondary | ICD-10-CM

## 2011-02-17 DIAGNOSIS — R109 Unspecified abdominal pain: Secondary | ICD-10-CM | POA: Insufficient documentation

## 2011-02-17 DIAGNOSIS — R197 Diarrhea, unspecified: Secondary | ICD-10-CM | POA: Insufficient documentation

## 2011-02-17 DIAGNOSIS — K219 Gastro-esophageal reflux disease without esophagitis: Secondary | ICD-10-CM | POA: Insufficient documentation

## 2011-02-17 DIAGNOSIS — F319 Bipolar disorder, unspecified: Secondary | ICD-10-CM | POA: Insufficient documentation

## 2011-02-17 DIAGNOSIS — R0789 Other chest pain: Secondary | ICD-10-CM

## 2011-02-17 DIAGNOSIS — F17200 Nicotine dependence, unspecified, uncomplicated: Secondary | ICD-10-CM | POA: Insufficient documentation

## 2011-02-17 DIAGNOSIS — R112 Nausea with vomiting, unspecified: Secondary | ICD-10-CM | POA: Insufficient documentation

## 2011-02-17 LAB — POCT I-STAT, CHEM 8
Chloride: 104 mEq/L (ref 96–112)
HCT: 46 % (ref 39.0–52.0)
Potassium: 3.3 mEq/L — ABNORMAL LOW (ref 3.5–5.1)

## 2011-02-17 LAB — CBC
Platelets: 223 10*3/uL (ref 150–400)
RBC: 4.8 MIL/uL (ref 4.22–5.81)
WBC: 7.5 10*3/uL (ref 4.0–10.5)

## 2011-02-17 MED ORDER — ONDANSETRON HCL 4 MG/2ML IJ SOLN
4.0000 mg | Freq: Once | INTRAMUSCULAR | Status: AC
  Administered 2011-02-17: 4 mg via INTRAVENOUS
  Filled 2011-02-17: qty 2

## 2011-02-17 MED ORDER — PANTOPRAZOLE SODIUM 40 MG IV SOLR
40.0000 mg | Freq: Once | INTRAVENOUS | Status: AC
  Administered 2011-02-17: 40 mg via INTRAVENOUS
  Filled 2011-02-17: qty 40

## 2011-02-17 NOTE — ED Notes (Signed)
Pt c/o CP and SOB with nausea starting 1 hour ago; pt sts off psych meds x 4 weeks; pt agitated in triage

## 2011-02-17 NOTE — ED Provider Notes (Signed)
History     CSN: [id number]  Arrival date & time 02/17/11  1843   First MD Initiated Contact with Patient 02/17/11 2314      Chief Complaint  Patient presents with  . Chest Pain  . Shortness of Breath  . Abdominal Pain    (Consider location/radiation/quality/duration/timing/severity/associated sxs/prior treatment) HPI This is a 40 year old white male with a history of bipolar disorder. He admits he has not been taking his medications for over a month. He states he attempted to get refills at [facility] this afternoon but was unsuccessful. He then walked to the hospital and checked him. He states he has been having chest pain and shortness of breath this started about an hour prior to arrival. The pain feels like a brick sitting on his chest, is worse with movement or palpation and better when lying still. The pain is moderate to severe. It was accompanied by the sensation that his stomach twisting. He also had nausea and retching as well as acid reflux. He states he had diarrhea this morning although his stools were normal yesterday.  He states for the last 2 weeks at group of about 5 people have been out to get him. He states he fears for his life. He states he saw some of these people in the ED waiting room. He cannot explain why these people knew he was here. He is evasive when asked if he is suicidal or homicidal. He states he had half a beer earlier this afternoon. He denies drug use. He states he is afraid to leave the hospital for fear of being stabbed or shot dead. He also states that this is incidental and he is really here for his chest pain and other symptoms.  Past Medical History  Diagnosis Date  . Bipolar 1 disorder   . Anxiety     Past Surgical History  Procedure Date  . Finger surgery     History reviewed. No pertinent family history.  History  Substance Use Topics  . Smoking status: Current Everyday Smoker -- 2.0 packs/day  . Smokeless tobacco: Not on file  .  Alcohol Use: Yes      Review of Systems  All other systems reviewed and are negative.    Allergies  Review of patient's allergies indicates no known allergies.  Home Medications   Current Outpatient Rx  Name Route Sig Dispense Refill  . CLONAZEPAM 0.5 MG PO TABS Oral Take 0.5 mg by mouth daily.      Marland Kitchen PALIPERIDONE 3 MG PO TB24 Oral Take 3 mg by mouth daily.        BP 107/68  Pulse 57  Temp(Src) 97.1 F (36.2 C) (Oral)  Resp 20  SpO2 95%  Physical Exam General: Well-developed, well-nourished male in no acute distress; appearance consistent with age of record HENT: normocephalic, atraumatic Eyes: pupils equal round and reactive to light; extraocular muscles intact Neck: supple Heart: regular rate and rhythm; no murmurs, rubs or gallops Lungs: clear to auscultation bilaterally Chest: Moderate to severe parasternal tenderness bilaterally without deformity or crepitus Abdomen: soft; mild diffuse tenderness; nondistended; no masses or hepatosplenomegaly; bowel sounds present Extremities: No deformity; full range of motion; pulses normal; no edema Neurologic: Awake, alert and oriented; motor function intact in all extremities and symmetric; no facial droop Skin: Warm and dry Psychiatric: Flat affect    ED Course  Procedures (including critical care time)    MDM  EKG Interpretation:  Date & Time: 02/17/2011 107:11 PM  Rate: 76  Rhythm: normal sinus rhythm  QRS Axis: normal  Intervals: normal  ST/T Wave abnormalities: normal  Conduction Disutrbances:none  Narrative Interpretation:   Old EKG Reviewed: Rate is faster  Nursing notes and vitals signs, including pulse oximetry, reviewed.  Summary of this visit's results, reviewed by myself:  Labs:  Results for orders placed during the hospital encounter of 02/17/11  CBC      Component Value Range   WBC 7.5  4.0 - 10.5 (K/uL)   RBC 4.80  4.22 - 5.81 (MIL/uL)   Hemoglobin 15.6  13.0 - 17.0 (g/dL)   HCT 16.1   09.6 - 04.5 (%)   MCV 90.2  78.0 - 100.0 (fL)   MCH 32.5  26.0 - 34.0 (pg)   MCHC 36.0  30.0 - 36.0 (g/dL)   RDW 40.9  81.1 - 91.4 (%)   Platelets 223  150 - 400 (K/uL)  DIFFERENTIAL      Component Value Range   Neutrophils Relative 58  43 - 77 (%)   Lymphocytes Relative 34  12 - 46 (%)   Monocytes Relative 7  3 - 12 (%)   Eosinophils Relative 0  0 - 5 (%)   Basophils Relative 1  0 - 1 (%)   Neutro Abs 4.3  1.7 - 7.7 (K/uL)   Lymphs Abs 2.6  0.7 - 4.0 (K/uL)   Monocytes Absolute 0.5  0.1 - 1.0 (K/uL)   Eosinophils Absolute 0.0  0.0 - 0.7 (K/uL)   Basophils Absolute 0.1  0.0 - 0.1 (K/uL)   WBC Morphology ATYPICAL LYMPHOCYTES    ETHANOL      Component Value Range   Alcohol, Ethyl (B) <11  0 - 11 (mg/dL)  POCT I-STAT, CHEM 8      Component Value Range   Sodium 141  135 - 145 (mEq/L)   Potassium 3.3 (*) 3.5 - 5.1 (mEq/L)   Chloride 104  96 - 112 (mEq/L)   BUN 5 (*) 6 - 23 (mg/dL)   Creatinine, Ser 7.82  0.50 - 1.35 (mg/dL)   Glucose, Bld 956 (*) 70 - 99 (mg/dL)   Calcium, Ion 2.13  0.86 - 1.32 (mmol/L)   TCO2 24  0 - 100 (mmol/L)   Hemoglobin 15.6  13.0 - 17.0 (g/dL)   HCT 57.8  46.9 - 62.9 (%)  POCT I-STAT TROPONIN I      Component Value Range   Troponin i, poc 0.00  0.00 - 0.08 (ng/mL)   Comment 3           URINE RAPID DRUG SCREEN (HOSP PERFORMED)      Component Value Range   Opiates NONE DETECTED  NONE DETECTED    Cocaine NONE DETECTED  NONE DETECTED    Benzodiazepines NONE DETECTED  NONE DETECTED    Amphetamines NONE DETECTED  NONE DETECTED    Tetrahydrocannabinol POSITIVE (*) NONE DETECTED    Barbiturates NONE DETECTED  NONE DETECTED     Imaging Studies: Dg Chest 2 View  02/18/2011  *RADIOLOGY REPORT*  Clinical Data: Chest pain. Shortness of breath.  CHEST - 2 VIEW  Comparison: 02/02/2011, 01/12/2010 and 04/09/2008.  Findings: Minimal increased markings project over the anterior aspect of the right sixth rib.  This probably represents confluence of shadows as no  abnormality is seen on the recent chest x-ray at this level.  No infiltrate, congestive heart failure or pneumothorax.  Heart size within normal limits.  IMPRESSION: No acute abnormality.  Original Report Authenticated By: Fuller Canada,  M.D.   Patient to be evaluated by ACT.              Hanley Seamen, MD 02/18/11 [phone]

## 2011-02-18 ENCOUNTER — Emergency Department (HOSPITAL_COMMUNITY): Payer: Medicaid Other

## 2011-02-18 LAB — DIFFERENTIAL
Eosinophils Relative: 0 % (ref 0–5)
Monocytes Relative: 7 % (ref 3–12)
Neutrophils Relative %: 58 % (ref 43–77)

## 2011-02-18 LAB — ETHANOL: Alcohol, Ethyl (B): <11 mg/dL (ref 0–11)

## 2011-02-18 LAB — RAPID URINE DRUG SCREEN, HOSP PERFORMED
Barbiturates: NOT DETECTED
Cocaine: NOT DETECTED
Opiates: NOT DETECTED

## 2011-02-18 LAB — POCT I-STAT TROPONIN I: Troponin i, poc: 0 ng/mL (ref 0.00–0.08)

## 2011-02-18 MED ORDER — ONDANSETRON HCL 8 MG PO TABS: 4.0000 mg | ORAL_TABLET | Freq: Three times a day (TID) | ORAL | Status: DC | PRN

## 2011-02-18 MED ORDER — KETOROLAC TROMETHAMINE 30 MG/ML IJ SOLN
30.0000 mg | Freq: Once | INTRAMUSCULAR | Status: AC
  Administered 2011-02-18: 30 mg via INTRAVENOUS
  Filled 2011-02-18: qty 1

## 2011-02-18 MED ORDER — IBUPROFEN 200 MG PO TABS: 600.0000 mg | ORAL_TABLET | Freq: Three times a day (TID) | ORAL | Status: DC | PRN

## 2011-02-18 MED ORDER — ALUM & MAG HYDROXIDE-SIMETH 200-200-20 MG/5ML PO SUSP: 30.0000 mL | ORAL | Status: DC | PRN

## 2011-02-18 NOTE — ED Notes (Signed)
Pt is sleeping.  No distress noted.

## 2011-02-18 NOTE — ED Notes (Signed)
Dr. Denton Lank at bedside talking with pt

## 2011-02-18 NOTE — ED Notes (Signed)
Spoke with Dahlia Client, Child psychotherapist to see pt regarding resources

## 2011-02-18 NOTE — ED Notes (Signed)
Pt refusing to leave, not getting dressed & is just sitting, not moving. Security now at bedside to assist.

## 2011-02-18 NOTE — Progress Notes (Signed)
Clinical Social work received consult that patient is homeless and transportation.  Attempted to meet with patient who was lying bed with covers over head.  Patient was very short with answering questions and dismissive of CSW.  Discussed with patient where he lived and he reports in a motel but that is not what he wanted.  Patient was given resources for local halfway houses, shelters, Day [facility]), and transitional houses.  Discussed substance abuse with patient as well, but not receptive of education or resources.    Due to homeless issue, patient also lacking transportation in which he received bus pass.  No other needs voiced at this time.  Ashley Jacobs, MSW LCSW [phone]

## 2011-02-18 NOTE — ED Notes (Signed)
Pt in room talking on cell phone, attempting to reschedule his court date today. Diet tray ordered

## 2011-02-18 NOTE — ED Notes (Signed)
Report received, assumed care. Pt moved to room 19

## 2011-02-18 NOTE — ED Notes (Signed)
Pt resting in bed sleeping at present. Arouses to voice. Oriented x4. Offers no complaints. Plan of care reviewed with pt. Pt aware pt waiting for ACT team consult.

## 2011-02-18 NOTE — ED Notes (Signed)
Patient is resting comfortably with eyes closed, no s/s of any pain or distress observed at this time. Pt is awaiting MD re-evaluation for disposition. Will continue to monitor pt.

## 2011-02-18 NOTE — ED Notes (Signed)
Pt resting, eyes closed, has not touched breakfast tray.  [name], Child psychotherapist at bedside

## 2011-02-18 NOTE — BH Assessment (Signed)
Assessment Note   [name] is an 40 y.o. male that was assessed this day.  Pt initially self-referred to [facility] due to chest pain.  However, pt then stated he thought others from group were out to get him in the lobby of the ED.  When assessed, pt denied current SI/HI or psychosis.  Pt stated he has had thoughts of hurting himself and others in the past, but denies current thoughts.  Pt did state he thought others were out to get him in the ED.  Pt would not elaborate when asked about this.  Pt did state he was using ETOH and THC, but would not specify frequency or duration of ETOH, just stated he had a beer the day before yesterday.  He admits to smoking a blunt "whenever I can get my hands on it."  Pt was concerned about a court date that he has today.  When asked about this, he stated it was for "family issues."  Pt does endorse sx of depression and has a hx of dx of Biploar Disorder.  Upon reading pt's history, it is documented that a 50b was taken out by mother's boyfriend whom pt has has problems with in the past.  Pt was discharged from [facility] on 02/12/11, was given indigent meds and told to follow up with [facility].  Pt denies having meds and stated he has not had any in one month.  Pt was irritable during assessment and did not want to answer questions.  Pt had to be asked to cooperate with assessment.  Pt gave different information to Clinical research associate, nursing staff and EDP.  Therefore, consulted with EDP Steinl, who assessed pt and recommended pt follow up with outpatient treatment at [facility].  EDP also requested social work involvement to give pt resources, as he is homeless.  ED nurse called SW.  Writer also spoke with Dahlia Client (SW) and she is going to give pt referrals.  Completed assessment and assessment notification, faxed to [facility] to log.  Pt to be discharged home and given outpatient referral to [facility].  Writer called [employer] several times, finally connected to crisis unit and told OP clinic backed up.  It  is recommended pt walk into [facility] today.  Pt was given bus pass by SW.  Updated EDP who was in agreement as welll as ED staff.  Axis I: Bipolar, Depressed Axis II: Deferred Axis III:  Past Medical History  Diagnosis Date  . Bipolar 1 disorder   . Anxiety    Axis IV: economic problems, housing problems, occupational problems, other psychosocial or environmental problems, problems related to legal system/crime, problems related to social environment and problems with primary support group Axis V: 41-50 serious symptoms  Past Medical History:  Past Medical History  Diagnosis Date  . Bipolar 1 disorder   . Anxiety     Past Surgical History  Procedure Date  . Finger surgery     Family History: History reviewed. No pertinent family history.  Social History:  reports that he has been smoking.  He does not have any smokeless tobacco history on file. He reports that he drinks alcohol. He reports that he uses illicit drugs (Marijuana).  Additional Social History:  Alcohol / Drug Use Pain Medications: none Prescriptions: states is prescribed meds but has not had in one month Over the Counter: n/a History of alcohol / drug use?: Yes Substance #1 Name of Substance 1: ETOH 1 - Age of First Use: pt would not answer 1 -  Amount (size/oz): varies - pt did not state how much, only that he drank one beer yesterday 1 - Frequency: varies - pt did not state how much, only that he drank one beer yesterday 1 - Duration: pt did not answer 1 - Last Use / Amount: yesterday - pt stated drank one beer Substance #2 Name of Substance 2: THC 2 - Age of First Use: unknown - pt did not answer 2 - Amount (size/oz): 1 blunt 2 - Frequency: "when I can get my hands on it" 2 - Duration: 2 days ago 2 - Last Use / Amount: 1 blunt Allergies: No Known Allergies  Home Medications:  Medications Prior to Admission  Medication Dose Route Frequency Provider Last Rate Last Dose  . alum & mag hydroxide-simeth  (MAALOX/MYLANTA) 200-200-20 MG/5ML suspension 30 mL  30 mL Oral PRN John L Molpus, MD      . ibuprofen (ADVIL,MOTRIN) tablet 600 mg  600 mg Oral Q8H PRN John L Molpus, MD      . ketorolac (TORADOL) 30 MG/ML injection 30 mg  30 mg Intravenous Once Carlisle Beers Molpus, MD   30 mg at 02/18/11 0531  . ondansetron (ZOFRAN) injection 4 mg  4 mg Intravenous Once Carlisle Beers Molpus, MD   4 mg at 02/17/11 2343  . ondansetron (ZOFRAN) tablet 4 mg  4 mg Oral Q8H PRN John L Molpus, MD      . pantoprazole (PROTONIX) injection 40 mg  40 mg Intravenous Once Carlisle Beers Molpus, MD   40 mg at 02/17/11 2343   Medications Prior to Admission  Medication Sig Dispense Refill  . paliperidone (INVEGA) 3 MG 24 hr tablet Take 3 mg by mouth daily.          OB/GYN Status:  No LMP for male patient.  General Assessment Data Location of Assessment: [facility] ED Living Arrangements: Homeless Can pt return to current living arrangement?: Yes Admission Status: Voluntary Is patient capable of signing voluntary admission?: Yes Transfer from: Acute Hospital Referral Source: Self/Family/Friend  Education Status Is patient currently in school?: No Current Grade: n/a Highest grade of school patient has completed: 10 Name of school: n/a Contact person: n/a  Risk to self Suicidal Ideation: No Suicidal Intent: No Is patient at risk for suicide?: No (denies SI currently) Suicidal Plan?: No Specify Current Suicidal Plan:  (denies) Access to Means: No Specify Access to Suicidal Means:  (n/a) What has been your use of drugs/alcohol within the last 12 months?:  (Reports varied use of ETOH and THC) Previous Attempts/Gestures: Yes How many times?: 3  (per last clinician assessment) Other Self Harm Risks:  (n/a) Triggers for Past Attempts: Family contact;Other (Comment) Intentional Self Injurious Behavior: None Family Suicide History: Unknown Recent stressful life event(s): Legal Issues;Conflict (Comment) Persecutory voices/beliefs?:  No Depression: Yes Depression Symptoms: Despondent;Insomnia;Feeling worthless/self pity;Feeling angry/irritable Substance abuse history and/or treatment for substance abuse?: No Suicide prevention information given to non-admitted patients: Not applicable  Risk to Others Homicidal Ideation: No (did state that he wanted to hurt someone in past) Thoughts of Harm to Others: No Comment - Thoughts of Harm to Others:  (pt stated he wanted to hurt someone in past) Current Homicidal Intent: No Current Homicidal Plan: No Describe Current Homicidal Plan:  (n/a) Access to Homicidal Means: No Identified Victim:  (n/a) History of harm to others?: No Assessment of Violence: None Noted Violent Behavior Description:  (n/a ) Does patient have access to weapons?: No Criminal Charges Pending?: Yes Describe Pending Criminal Charges:  50b by mother's boyfriend Does patient have a court date: Yes Court Date: 02/18/11  Psychosis Hallucinations: None noted Delusions:  (stated others are out to get him in the lobby)  Mental Status Report Appear/Hygiene: Disheveled Eye Contact: Poor Motor Activity: Unremarkable Speech: Logical/coherent Level of Consciousness: Irritable Mood: Irritable Affect: Irritable Anxiety Level: None Thought Processes: Coherent;Relevant Judgement: Impaired Orientation: Person;Place;Time;Situation Obsessive Compulsive Thoughts/Behaviors: None  Cognitive Functioning Concentration: Normal Memory: Recent Intact;Remote Intact IQ: Average Insight: Poor Impulse Control: Poor Appetite: Fair Weight Loss: 0  Weight Gain: 0  Sleep: No Change Total Hours of Sleep:  (unknown) Vegetative Symptoms: None  Prior Inpatient Therapy Prior Inpatient Therapy: Yes Prior Therapy Dates: June/2010, Oct 2012, Dec 2012 Prior Therapy Facilty/Provider(s): [facility] Reason for Treatment: Bipolar/ Detox  Prior Outpatient Therapy Prior Outpatient Therapy: Yes Prior Therapy Dates:  2008-present Prior Therapy Facilty/Provider(s): [facility] Reason for Treatment: MH     Home Assistive Devices/Equipment Home Assistive Devices/Equipment: None    Abuse/Neglect Assessment (Assessment to be complete while patient is alone) Physical Abuse: Denies Verbal Abuse: Denies Sexual Abuse: Denies Exploitation of patient/patient's resources: Denies Self-Neglect: Denies Values / Beliefs Cultural Requests During Hospitalization: None Spiritual Requests During Hospitalization: None Consults Spiritual Care Consult Needed: No Social Work Consult Needed: No Merchant navy officer (For Healthcare) Advance Directive: Patient does not have advance directive;Patient would not like information    Additional Information 1:1 In Past 12 Months?: No CIRT Risk: No Elopement Risk: No Does patient have medical clearance?: Yes     Disposition:  Disposition Disposition of Patient: Other dispositions;Referred to;Outpatient treatment (Pt discharged home and given OP referral to [facility]) Type of outpatient treatment: Adult Other disposition(s): Referred to outside facility [facility]) Patient referred to: Outpatient clinic referral Vesta Mixer)  On Site Evaluation by:   Reviewed with Physician:  Valene Bors, Rennis Harding 02/18/2011 9:07 AM

## 2011-02-18 NOTE — ED Notes (Signed)
Pt refused to take discharge paperwork & resources given to him by social worker & ACT Team. He threw the paperwork onto table & stated he didn't want it. Pt gave back bus pass stated he didn't need it.  Security escorted pt out, pt refused to sign for discharge

## 2011-02-18 NOTE — ED Notes (Addendum)
Kristen, ACT Team at bedside  

## 2011-02-18 NOTE — ED Provider Notes (Signed)
Pt w hx bipolar disorder, anxiety. Hx past and current non compliance w medication. Pt up in room, talking on phone. Pt has normal mood and affect, does not appear acutely depressed. Pt does voice concerns re current court date as well as homelessness. Denies thoughts/plan of harm to self or others. Pt w recent [facility] admission  - states he feels inpt psych stay did not benefit him. Also states intermittent etoh and substance abuse, but denies daily use, denies needing or wanting inpt rehab/detox. From recent [facility] stay, plan for follow up at [facility] 12/28 - act team asked to verify appt for tomorrow as planned, and to give other resources for outpt follow up. Also asked nurse to have sw provide community resources.    Suzi Roots, MD 02/18/11 [phone]

## 2012-01-03 IMAGING — CR DG CHEST 2V
2 series · 2 of 2 positions shown · non-contrast
Comparison: Chest x-ray 01/12/2010.

CLINICAL DATA: Cough, weakness and shortness of breath.

CHEST - 2 VIEW

[w chest pa]
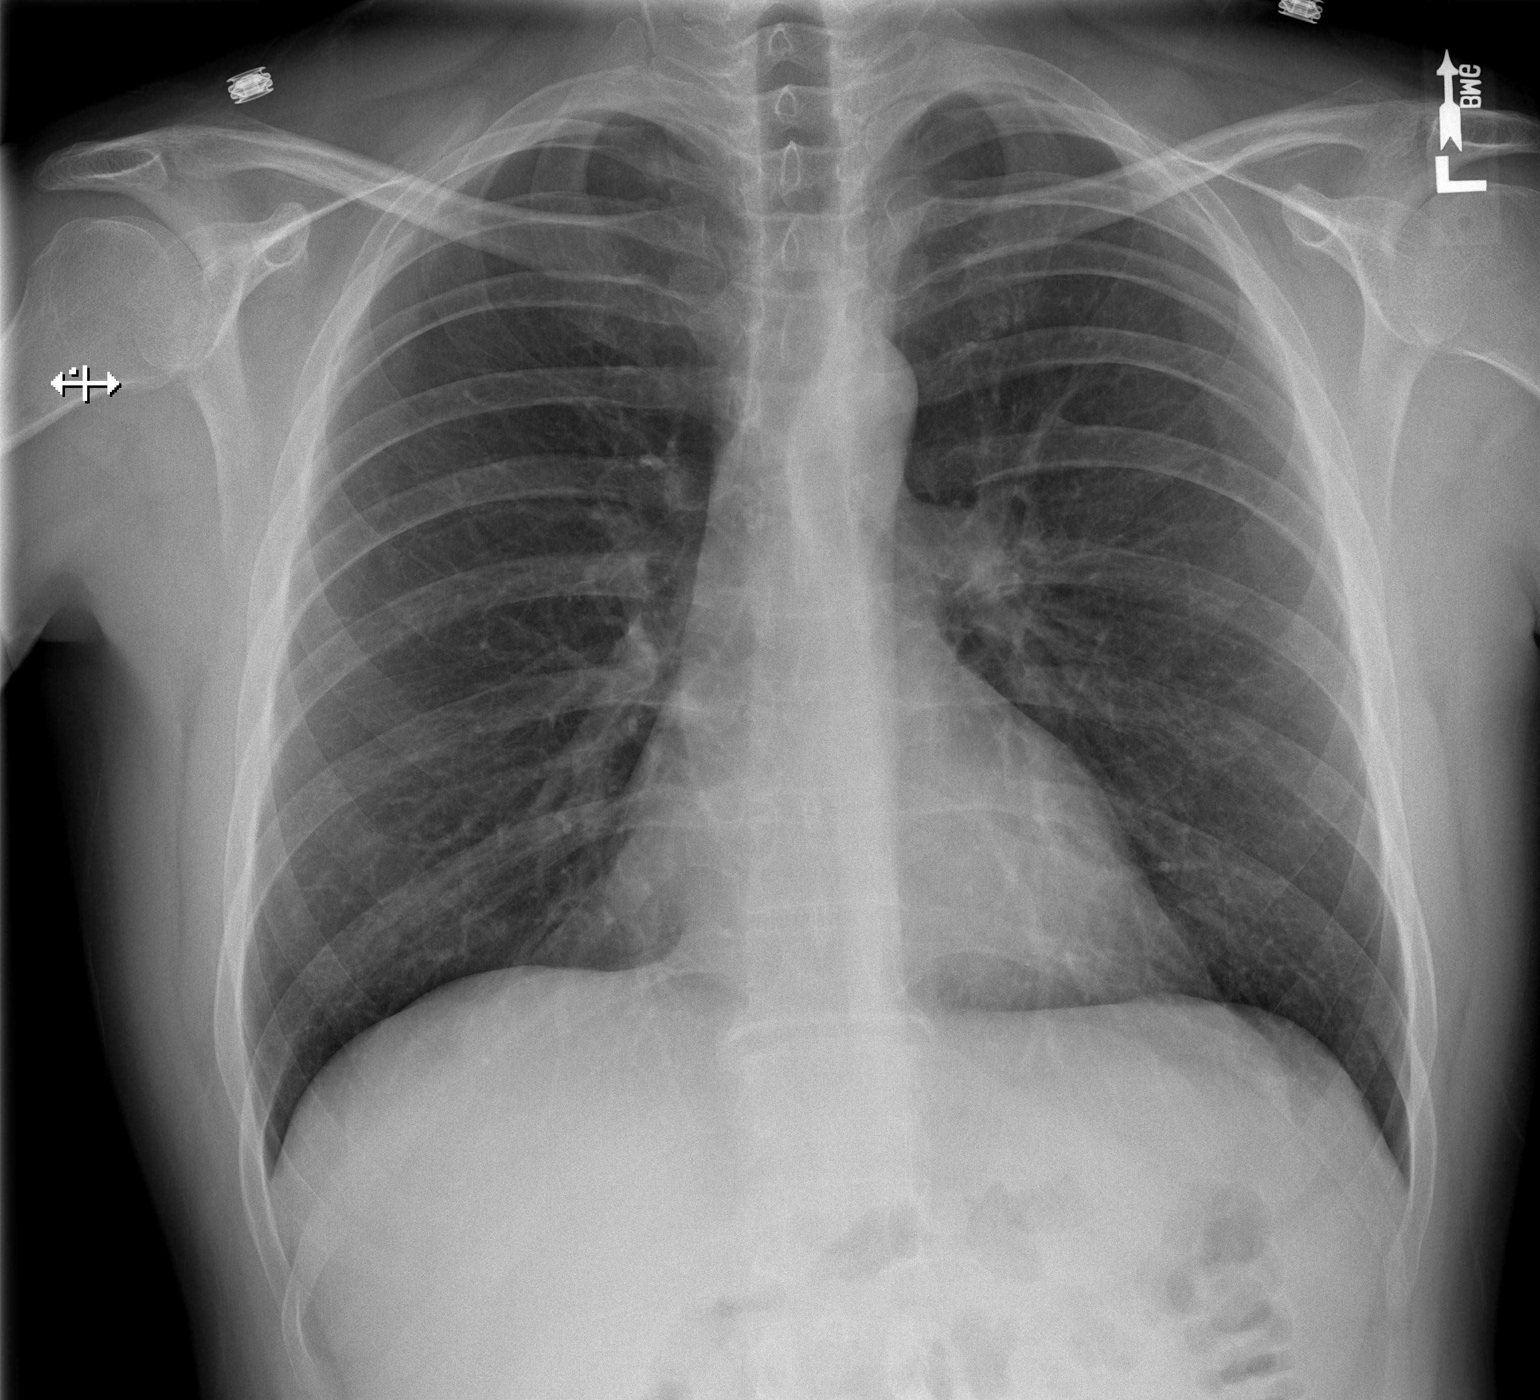

[w chest lat]
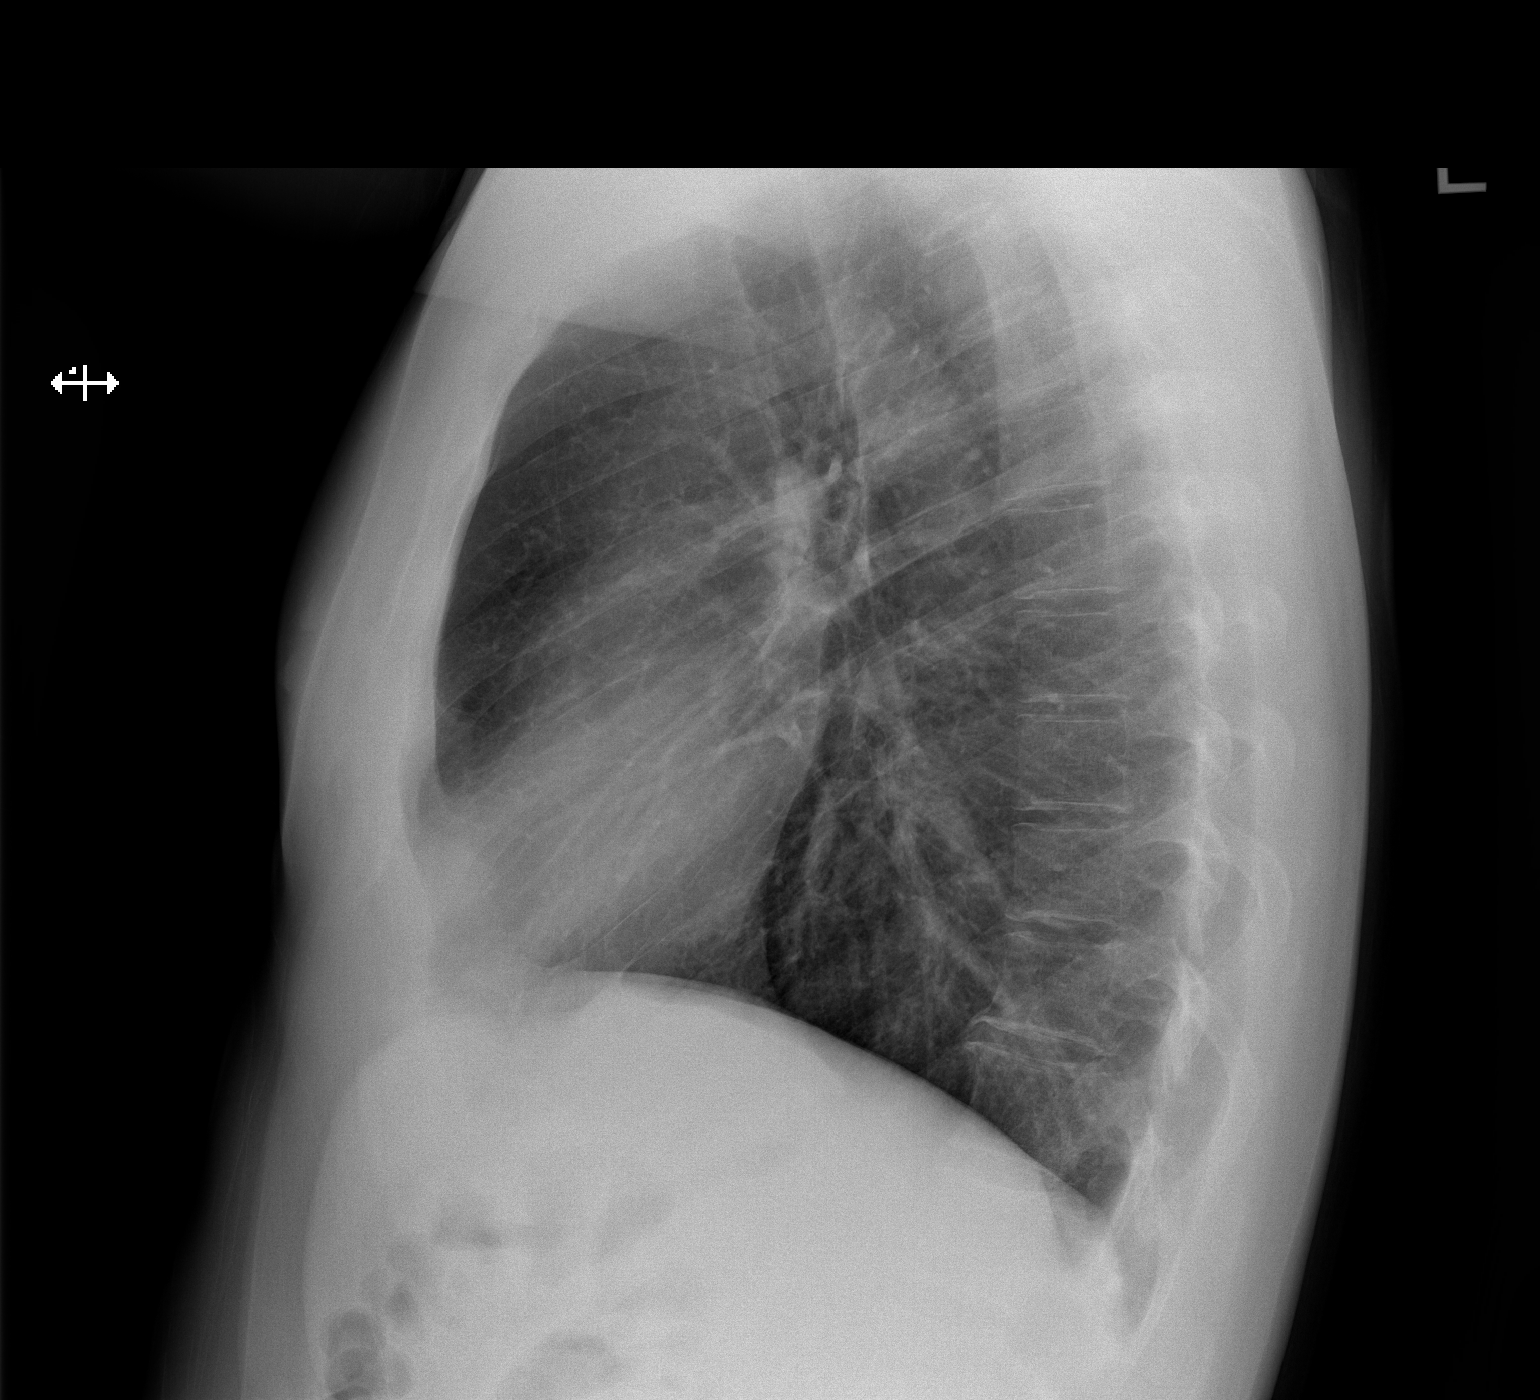

[2 of 2 positions shown; findings below may reference images not displayed]

FINDINGS: The cardiac silhouette, mediastinal and hilar contours
are within normal limits.  The lungs are clear.  No pleural
effusion.  The bony thorax is intact.
IMPRESSION: No acute cardiopulmonary findings.  No change since prior study.

## 2012-09-20 DIAGNOSIS — F17200 Nicotine dependence, unspecified, uncomplicated: Secondary | ICD-10-CM | POA: Insufficient documentation

## 2012-09-20 DIAGNOSIS — Z8249 Family history of ischemic heart disease and other diseases of the circulatory system: Secondary | ICD-10-CM | POA: Insufficient documentation

## 2012-09-20 DIAGNOSIS — L74519 Primary focal hyperhidrosis, unspecified: Secondary | ICD-10-CM | POA: Insufficient documentation

## 2012-12-02 ENCOUNTER — Encounter (HOSPITAL_COMMUNITY)
Admission: EM | Disposition: A | Discharge status: Home / Self Care | Payer: Self-pay | Source: Home / Self Care | Attending: Orthopedic Surgery

## 2012-12-02 ENCOUNTER — Encounter (HOSPITAL_COMMUNITY): Payer: Self-pay | Admitting: Emergency Medicine

## 2012-12-02 ENCOUNTER — Inpatient Hospital Stay: Admit: 2012-12-02 | Payer: Self-pay | Admitting: Orthopedic Surgery

## 2012-12-02 ENCOUNTER — Inpatient Hospital Stay (HOSPITAL_COMMUNITY)
Admission: EM | Admit: 2012-12-02 | Discharge: 2012-12-03 | DRG: 902 | Disposition: A | Discharge status: Home / Self Care | Payer: Medicaid Other | Attending: Orthopedic Surgery | Admitting: Orthopedic Surgery

## 2012-12-02 ENCOUNTER — Emergency Department (HOSPITAL_COMMUNITY): Payer: Medicaid Other

## 2012-12-02 DIAGNOSIS — F411 Generalized anxiety disorder: Secondary | ICD-10-CM | POA: Diagnosis present

## 2012-12-02 DIAGNOSIS — S61409A Unspecified open wound of unspecified hand, initial encounter: Principal | ICD-10-CM | POA: Diagnosis present

## 2012-12-02 DIAGNOSIS — W5501XA Bitten by cat, initial encounter: Secondary | ICD-10-CM

## 2012-12-02 DIAGNOSIS — L0390 Cellulitis, unspecified: Secondary | ICD-10-CM

## 2012-12-02 DIAGNOSIS — F17200 Nicotine dependence, unspecified, uncomplicated: Secondary | ICD-10-CM | POA: Diagnosis present

## 2012-12-02 DIAGNOSIS — IMO0001 Reserved for inherently not codable concepts without codable children: Secondary | ICD-10-CM | POA: Diagnosis present

## 2012-12-02 DIAGNOSIS — F319 Bipolar disorder, unspecified: Secondary | ICD-10-CM | POA: Diagnosis present

## 2012-12-02 DIAGNOSIS — Z79899 Other long term (current) drug therapy: Secondary | ICD-10-CM

## 2012-12-02 DIAGNOSIS — L02519 Cutaneous abscess of unspecified hand: Secondary | ICD-10-CM | POA: Diagnosis present

## 2012-12-02 LAB — CBC WITH DIFFERENTIAL/PLATELET
HCT: 44.7 % (ref 39.0–52.0)
Hemoglobin: 16.5 g/dL (ref 13.0–17.0)
Lymphocytes Relative: 17 % (ref 12–46)
Lymphs Abs: 1.8 10*3/uL (ref 0.7–4.0)
MCHC: 36.9 g/dL — ABNORMAL HIGH (ref 30.0–36.0)
Monocytes Absolute: 0.8 10*3/uL (ref 0.1–1.0)
Monocytes Relative: 8 % (ref 3–12)
Neutro Abs: 7.9 10*3/uL — ABNORMAL HIGH (ref 1.7–7.7)
Neutrophils Relative %: 75 % (ref 43–77)
RBC: 4.98 MIL/uL (ref 4.22–5.81)
WBC: 10.5 10*3/uL (ref 4.0–10.5)

## 2012-12-02 LAB — POCT I-STAT, CHEM 8
BUN: 5 mg/dL — ABNORMAL LOW (ref 6–23)
Calcium, Ion: 1.09 mmol/L — ABNORMAL LOW (ref 1.12–1.23)
Chloride: 105 mEq/L (ref 96–112)
Creatinine, Ser: 0.9 mg/dL (ref 0.50–1.35)
Glucose, Bld: 88 mg/dL (ref 70–99)
HCT: 48 % (ref 39.0–52.0)
Potassium: 3.6 mEq/L (ref 3.5–5.1)

## 2012-12-02 SURGERY — IRRIGATION AND DEBRIDEMENT EXTREMITY
Anesthesia: General | Laterality: Left

## 2012-12-02 MED ORDER — METHOCARBAMOL 500 MG PO TABS
500.0000 mg | ORAL_TABLET | Freq: Four times a day (QID) | ORAL | Status: DC | PRN
  Administered 2012-12-02 – 2012-12-03 (×2): 500 mg via ORAL
  Filled 2012-12-02 (×2): qty 1

## 2012-12-02 MED ORDER — SODIUM CHLORIDE 0.9 % IV SOLN
3.0000 g | Freq: Four times a day (QID) | INTRAVENOUS | Status: DC
  Administered 2012-12-02 – 2012-12-03 (×4): 3 g via INTRAVENOUS
  Filled 2012-12-02 (×8): qty 3

## 2012-12-02 MED ORDER — METHOCARBAMOL 100 MG/ML IJ SOLN
500.0000 mg | Freq: Four times a day (QID) | INTRAVENOUS | Status: DC | PRN
  Filled 2012-12-02: qty 5

## 2012-12-02 MED ORDER — SENNA 8.6 MG PO TABS
1.0000 | ORAL_TABLET | Freq: Two times a day (BID) | ORAL | Status: DC
  Administered 2012-12-03 (×2): 8.6 mg via ORAL
  Filled 2012-12-02 (×3): qty 1

## 2012-12-02 MED ORDER — TETANUS-DIPHTH-ACELL PERTUSSIS 5-2.5-18.5 LF-MCG/0.5 IM SUSP
0.5000 mL | Freq: Once | INTRAMUSCULAR | Status: AC
  Administered 2012-12-02: 0.5 mL via INTRAMUSCULAR
  Filled 2012-12-02: qty 0.5

## 2012-12-02 MED ORDER — CLINDAMYCIN PHOSPHATE 600 MG/50ML IV SOLN: 600.0000 mg | Freq: Once | INTRAVENOUS | Status: DC

## 2012-12-02 MED ORDER — SODIUM CHLORIDE 0.45 % IV SOLN
INTRAVENOUS | Status: DC
  Administered 2012-12-02 – 2012-12-03 (×2): via INTRAVENOUS

## 2012-12-02 MED ORDER — ADULT MULTIVITAMIN W/MINERALS CH
1.0000 | ORAL_TABLET | Freq: Every day | ORAL | Status: DC
  Administered 2012-12-03: 1 via ORAL
  Filled 2012-12-02: qty 1

## 2012-12-02 MED ORDER — CLONAZEPAM 0.5 MG PO TABS
0.5000 mg | ORAL_TABLET | Freq: Every day | ORAL | Status: DC
  Administered 2012-12-03: 0.5 mg via ORAL
  Filled 2012-12-02: qty 1

## 2012-12-02 MED ORDER — OXYCODONE HCL 5 MG PO TABS
5.0000 mg | ORAL_TABLET | ORAL | Status: DC | PRN
  Administered 2012-12-02 – 2012-12-03 (×6): 10 mg via ORAL
  Filled 2012-12-02 (×6): qty 2

## 2012-12-02 MED ORDER — PALIPERIDONE ER 3 MG PO TB24
3.0000 mg | ORAL_TABLET | Freq: Every day | ORAL | Status: DC
  Administered 2012-12-02 – 2012-12-03 (×2): 3 mg via ORAL
  Filled 2012-12-02 (×2): qty 1

## 2012-12-02 MED ORDER — PANTOPRAZOLE SODIUM 40 MG PO TBEC: 40.0000 mg | DELAYED_RELEASE_TABLET | Freq: Two times a day (BID) | ORAL | Status: DC | PRN

## 2012-12-02 MED ORDER — MORPHINE SULFATE 2 MG/ML IJ SOLN
1.0000 mg | INTRAMUSCULAR | Status: DC | PRN
  Administered 2012-12-02 – 2012-12-03 (×5): 1 mg via INTRAVENOUS
  Filled 2012-12-02 (×5): qty 1

## 2012-12-02 MED ORDER — SODIUM CHLORIDE 0.9 % IV SOLN
3.0000 g | Freq: Once | INTRAVENOUS | Status: AC
  Administered 2012-12-02: 3 g via INTRAVENOUS
  Filled 2012-12-02: qty 3

## 2012-12-02 NOTE — ED Provider Notes (Addendum)
CSN: [id number]     Arrival date & time 12/02/12  1258 History   First MD Initiated Contact with Patient 12/02/12 1308     Chief Complaint  Patient presents with  . Hand Injury  . Animal Bite   (Consider location/radiation/quality/duration/timing/severity/associated sxs/prior Treatment) Patient is a 42 y.o. male presenting with hand injury and animal bite. The history is provided by the patient.  Hand Injury Location:  Hand Time since incident:  24 hours Injury: yes   Mechanism of injury comment:  He was rounding up stray cats and one bit him Hand location:  L hand Pain details:    Quality:  Throbbing, shooting and sharp   Radiates to:  Does not radiate   Severity:  Severe   Onset quality:  Gradual   Duration:  24 hours   Timing:  Constant   Progression:  Worsening Chronicity:  New Handedness:  Right-handed Foreign body present:  No foreign bodies Tetanus status:  Out of date Prior injury to area:  No Relieved by:  Nothing Worsened by:  Nothing tried Ineffective treatments:  None tried Associated symptoms: decreased range of motion, stiffness and swelling   Associated symptoms: no fever and no muscle weakness   Animal Bite Associated symptoms: no fever     Past Medical History  Diagnosis Date  . Bipolar 1 disorder   . Anxiety    Past Surgical History  Procedure Laterality Date  . Finger surgery     History reviewed. No pertinent family history. History  Substance Use Topics  . Smoking status: Current Every Day Smoker -- 2.00 packs/day  . Smokeless tobacco: Not on file  . Alcohol Use: Yes    Review of Systems  Constitutional: Negative for fever.  Musculoskeletal: Positive for stiffness.  All other systems reviewed and are negative.    Allergies  Review of patient's allergies indicates no known allergies.  Home Medications   Current Outpatient Rx  Name  Route  Sig  Dispense  Refill  . clonazePAM (KLONOPIN) 0.5 MG tablet   Oral   Take 0.5 mg by  mouth daily.           Marland Kitchen neomycin-bacitracin-polymyxin (NEOSPORIN) ointment   Topical   Apply 1 application topically once. apply to eye         . paliperidone (INVEGA) 3 MG 24 hr tablet   Oral   Take 3 mg by mouth daily.            BP 109/78  Pulse 92  Temp(Src) 98.5 F (36.9 C) (Oral)  Resp 18  Ht 5\' 9"  (1.753 m)  Wt 160 lb (72.576 kg)  BMI 23.62 kg/m2  SpO2 96% Physical Exam  Nursing note and vitals reviewed. Constitutional: He is oriented to person, place, and time. He appears well-developed and well-nourished. No distress.  HENT:  Head: Normocephalic and atraumatic.  Eyes: EOM are normal. Pupils are equal, round, and reactive to light.  Cardiovascular: Normal rate and intact distal pulses.   Pulmonary/Chest: Effort normal.  Musculoskeletal: He exhibits tenderness.       Left hand: He exhibits tenderness and swelling. Normal sensation noted. Normal strength noted.       Hands: Neurological: He is alert and oriented to person, place, and time.  Skin: Skin is warm and dry.  Psychiatric: He has a normal mood and affect. His behavior is normal.    ED Course  Procedures (including critical care time) Labs Review Labs Reviewed  CBC WITH DIFFERENTIAL - Abnormal;  Notable for the following:    MCHC 36.9 (*)    Neutro Abs 7.9 (*)    All other components within normal limits  POCT I-STAT, CHEM 8 - Abnormal; Notable for the following:    BUN 5 (*)    Calcium, Ion 1.09 (*)    All other components within normal limits   Imaging Review No results found.  EKG Interpretation   None       MDM   1. Cellulitis   2. Cat bite, initial encounter     Patient here with a cat bite to the hand that occurred approximately 24 hours ago the hand is now red swollen and tenderness with flexion/extension of the fingers.  Denies any systemic symptoms and unknown last tetanus shot. Evidence on exam and is concerning for underlying infection possible abscess. Patient started on  IV antibiotics unasyn, i-STAT, CBC and hand film pending. Tetanus updated  2:58 PM Spoke with Dr. Amanda Pea who will come see the pt and assess if he needs drainage.  Gwyneth Sprout, MD 12/02/12 1458  Gwyneth Sprout, MD 12/02/12 1544

## 2012-12-02 NOTE — ED Notes (Addendum)
Orthopedic Wynona Neat PA at bedside

## 2012-12-02 NOTE — H&P (Signed)
[name] is an 42 y.o. male.   Chief Complaint: Cat bite HPI: The patient is a pleasant 42 year old male who presents to the emergency room setting for evaluation of his left hand. He states that yesterday he was trying to box up and multiple feral of cats that were at his house he sustained a bite wound to the left hand about the dorsal aspect overlying the index finger and middle finger. Animal control was subsequently called and currently the cats are in quarantine. The patient had increasing pain and swelling and early erythema about the dorsal aspect of the left hand and presented to the emergency room 24 hours later for evaluation. Evaluation by the emergency room staff revealed that the patient had early cellulitis dorsal swelling and inability to move the hand secondary to pain. Hand surgery was consult for evaluation. Patient has had a tetanus shot updated and has been given 3 g of Unasyn IV at the time of their evaluation.   Past Medical History  Diagnosis Date  . Bipolar 1 disorder   . Anxiety     Past Surgical History  Procedure Laterality Date  . Finger surgery      History reviewed. No pertinent family history. Social History:  reports that he has been smoking.  He does not have any smokeless tobacco history on file. He reports that he drinks alcohol. He reports that he uses illicit drugs (Marijuana).  Allergies: No Known Allergies  Medications Prior to Admission  Medication Sig Dispense Refill  . clonazePAM (KLONOPIN) 0.5 MG tablet Take 0.5 mg by mouth daily.        Marland Kitchen neomycin-bacitracin-polymyxin (NEOSPORIN) ointment Apply 1 application topically once. apply to eye      . paliperidone (INVEGA) 3 MG 24 hr tablet Take 3 mg by mouth daily.          Results for orders placed during the hospital encounter of 12/02/12 (from the past 48 hour(s))  CBC WITH DIFFERENTIAL     Status: Abnormal   Collection Time    12/02/12  1:40 PM      Result Value Range   WBC 10.5  4.0 -  10.5 K/uL   RBC 4.98  4.22 - 5.81 MIL/uL   Hemoglobin 16.5  13.0 - 17.0 g/dL   HCT 40.9  81.1 - 91.4 %   MCV 89.8  78.0 - 100.0 fL   MCH 33.1  26.0 - 34.0 pg   MCHC 36.9 (*) 30.0 - 36.0 g/dL   RDW 78.2  95.6 - 21.3 %   Platelets 224  150 - 400 K/uL   Neutrophils Relative % 75  43 - 77 %   Neutro Abs 7.9 (*) 1.7 - 7.7 K/uL   Lymphocytes Relative 17  12 - 46 %   Lymphs Abs 1.8  0.7 - 4.0 K/uL   Monocytes Relative 8  3 - 12 %   Monocytes Absolute 0.8  0.1 - 1.0 K/uL   Eosinophils Relative 0  0 - 5 %   Eosinophils Absolute 0.0  0.0 - 0.7 K/uL   Basophils Relative 0  0 - 1 %   Basophils Absolute 0.0  0.0 - 0.1 K/uL  POCT I-STAT, CHEM 8     Status: Abnormal   Collection Time    12/02/12  1:49 PM      Result Value Range   Sodium 139  135 - 145 mEq/L   Potassium 3.6  3.5 - 5.1 mEq/L   Chloride 105  96 - 112 mEq/L   BUN 5 (*) 6 - 23 mg/dL   Creatinine, Ser 9.60  0.50 - 1.35 mg/dL   Glucose, Bld 88  70 - 99 mg/dL   Calcium, Ion 4.54 (*) 1.12 - 1.23 mmol/L   TCO2 20  0 - 100 mmol/L   Hemoglobin 16.3  13.0 - 17.0 g/dL   HCT 09.8  11.9 - 14.7 %   Dg Hand Complete Left  12/02/2012   CLINICAL DATA:  Patent and scratch back at 24 hr ago on the left hand. Redness and swelling and stiffness.  EXAM: LEFT HAND - COMPLETE 3+ VIEW  COMPARISON:  None.  FINDINGS: No fracture or dislocation. An old fracture of the proximal phalanx of the index finger has been reduced with 2 fixation screws. It is well healed.  There are no areas of bone resorption to suggest osteomyelitis.  Dorsal soft tissue swelling is noted. No radiopaque foreign body.  IMPRESSION: No fracture or dislocation. No radiopaque foreign body. No evidence of osteomyelitis.   Electronically Signed   By: Amie Portland M.D.   On: 12/02/2012 14:09    Review of Systems  Constitutional: Negative.   HENT: Negative.   Eyes: Negative.   Respiratory: Negative.   Cardiovascular: Negative.   Gastrointestinal: Negative.   Musculoskeletal:        See history of present illness  Skin: Negative.   Neurological: Negative.   Endo/Heme/Allergies: Negative.   Psychiatric/Behavioral:       History of bipolar disorder    Blood pressure 109/78, pulse 92, temperature 98.5 F (36.9 C), temperature source Oral, resp. rate 18, height 5\' 9"  (1.753 m), weight 72.576 kg (160 lb), SpO2 96.00%. Physical Exam  The patient is alert and oriented in no acute distress the patient complains of pain in the affected upper extremity.  The patient is noted to have a normal HEENT exam.  Lung fields show equal chest expansion and no shortness of breath  abdomen exam is nontender without distention.  Lower extremity examination does not show any fracture dislocation or blood clot symptoms.  Pelvis is stable neck and back are stable and nontender Evaluation of the right upper extremity shows that he has multiple patches about the forearm however there is no signs of cellulitis no signs of deep space infection present. Evaluation of the left upper extremity reveals soft tissue swelling and erythema over the dorsal aspect of the hand with puncture wounds x3 the patient is very apprehensive to actively flex or extend the digits however the digits are not swollen and he has no signs of purulent flexor tenosynovitis. The patient has a bite injurylocated over the dorsal aspect of the index finger as was the middle finger he is significantly painful in these regions, as well as, one remaining bite about the dorsal aspect of the hand with scant purulence noted.  Assessment/Plan Left hand cat bite with evolving cellulitis. History of bipolar disorder Had a lengthy discussion with the patient regards to his upper extremity. I recommend we proceed with irrigation debridement and packing of the wounds as well as admittance to the hospital for IV antibiotics and close observation. After obtaining verbal consent the patient's hand was sterilely prepped and draped, filled block  utilizing 20 cc of Xylocaine without epinephrine was implemented. Once appropriate anesthetic was obtained attention was turned to the wounds the index finger was explored no gross purulence was present however the wound did track to the extensor hood region.No purulence in the MCP  joint was present.  Debridement of necrotic and pre-necrotic tissue was performed following this the middle finger dorsal wound was explored excisional debridement of necrotic and pre- necrotic tissue was performed as well. Tendinous structures were intact, EDC of the index and middle finger are intact. Dorsal wound about the hand was then explored, tendinous structures were intact, there was very scant purulence however there was no gross abscess present Additional debridement of necrotic and pre-necrotic tissue was performed. Following this over 2 L of saline were used to irrigate the wound copiously. Once this was performed the wounds were then packed and soft dressings were applied. Discussed with the patient the need for Hospital admittance, close observation, IV antibiotics and possible need for repeat I and D. Intra-operative cultures were obtained. Marland Kitchen.We have discussed with the patient the issues regarding their infection to the extremity. We will continue antibiotics and await culture results. Often times it will take 3-5 days for cultures to become final. During this time we will typically have patients on intravenous antibiotics until we can find a parenteral route of antibiotic regime specific for the bacteria or organism isolated. We have discussed with the patient the need for daily irrigation and debridement as well as therapy to the area. We have discussed with the patient the necessity of range of motion to the involved joints as discussed today. We have discussed with the patient the unpredictability of infections at times. We'll continue to work towards good pain control and restoration of function. The patient  understands the need for meticulous wound care and the necessity of proper followup.  The possible complications of stiffness (loss of motion), resistant infection, possible deep bone infection, possible chronic pain issues, possible need for multiple surgeries and even amputation.  With this in mind the patient understands our goal is to eradicate the infection to quiesence. We will continue to work towards these goals.  Chelbie Jarnagin L 12/02/2012, 5:33 PM

## 2012-12-02 NOTE — ED Notes (Signed)
Pt reports being bit and scratched by a stray cat yesterday, has multiple scratches to forearms and hands but having swelling, redness and warmth to left hand.

## 2012-12-03 LAB — CBC WITH DIFFERENTIAL/PLATELET
Basophils Absolute: 0 10*3/uL (ref 0.0–0.1)
Basophils Relative: 0 % (ref 0–1)
Eosinophils Absolute: 0.1 10*3/uL (ref 0.0–0.7)
Eosinophils Relative: 1 % (ref 0–5)
HCT: 39.2 % (ref 39.0–52.0)
Hemoglobin: 14.4 g/dL (ref 13.0–17.0)
Lymphs Abs: 2.5 10*3/uL (ref 0.7–4.0)
MCHC: 36.7 g/dL — ABNORMAL HIGH (ref 30.0–36.0)
Monocytes Relative: 11 % (ref 3–12)
Neutro Abs: 4.3 10*3/uL (ref 1.7–7.7)
Neutrophils Relative %: 56 % (ref 43–77)
Platelets: 190 10*3/uL (ref 150–400)
RBC: 4.28 MIL/uL (ref 4.22–5.81)

## 2012-12-03 MED ORDER — AMOXICILLIN-POT CLAVULANATE 875-125 MG PO TABS: 1.0000 | ORAL_TABLET | Freq: Two times a day (BID) | ORAL | Status: DC

## 2012-12-03 MED ORDER — OXYCODONE HCL 5 MG PO TABS: 5.0000 mg | ORAL_TABLET | ORAL | Status: DC | PRN

## 2012-12-03 NOTE — Progress Notes (Signed)
Patient ID: ASHAWN RINEHART, male   DOB: [birth year], 42 y.o.   MRN: [id number] Patient was seen bedside.  Procedure note: Patient underwent irrigation debridement skin simultaneous tissue as well as tendon sheath and tendon tissue left hand about 3 separate cat bite wounds. This was less than 20 cm he tolerated this well there were no concrete features following irrigation and debridement we then performed packing of all wounds. This was a debridement performed leg curette and scissors to. He tolerated this well.  Overall his wound conditions are improved. He stable for discharge and will see him tomorrow followup and keep a very close eye on him. Should any problems arise they'll call us immediately  All questions have been encouraged and answered.  Dominica Severin Md

## 2012-12-03 NOTE — Discharge Summary (Signed)
Physician Discharge Summary  Patient ID: ZAIRE LEVESQUE MRN: [id number] DOB/AGE: 09-14-1970 42 y.o.  Admit date: 12/02/2012 Discharge date: 12/03/2012  Admission Diagnoses: Cat bites multiple in nature left hand  Discharge Diagnoses: Same Active Problems:   * No active hospital problems. *   Discharged Condition: good  Hospital Course: Patient was admitted with a diagnosis of cat bite. He underwent irrigation debridement and 11 2014 and 12/03/2012. He tolerated his hospital stay well and had marked improvement after the initial irrigation and debridement. At the time of discharge he was awake alert and oriented in no acute distress. He'll be discharged home with follow up tomorrow 7:30 AM to continue dressing changes and weeks to the wound.  Crampons and advised. He tolerated the was ambulatory and had no signs of DVT infection that was worsening or other problems at the time of discharge. His infectious process about the left hand was much improved  Consults: None  Significant Diagnostic Studies: labs: See chart  Treatments: surgery: See notes in chart  Discharge Exam: Blood pressure 101/61, pulse 65, temperature 98 F (36.7 C), temperature source Oral, resp. rate 18, height 5\' 9"  (1.753 m), weight 72.576 kg (160 lb), SpO2 98.00%. General appearance: alert, cooperative and appears stated age .Marland KitchenThe patient is alert and oriented in no acute distress the patient complains of pain in the affected upper extremity.  The patient is noted to have a normal HEENT exam.  Lung fields show equal chest expansion and no shortness of breath  abdomen exam is nontender without distention.  Lower extremity examination does not show any fracture dislocation or blood clot symptoms.  Pelvis is stable neck and back are stable and nontender  His hand looks much improved there is no signs of worsening infection cellulitis or ascending erythema. We took him for discharge home at this  juncture.  Disposition: 01-Home or Self Care     Medication List    ASK your doctor about these medications       clonazePAM 0.5 MG tablet  Commonly known as:  KLONOPIN  Take 0.5 mg by mouth daily.     neomycin-bacitracin-polymyxin ointment  Commonly known as:  NEOSPORIN  Apply 1 application topically once. apply to eye     paliperidone 3 MG 24 hr tablet  Commonly known as:  INVEGA  Take 3 mg by mouth daily.           Follow-up Information   Follow up with [facility] Higinio Plan, MD. (Please come to the office tomorrow morning at 7:30 AM earlier followup)    Specialty:  Orthopedic Surgery   Contact information:   [address] [place] [address] [phone]       Signed: Karen Chafe 12/03/2012, 12:52 PM

## 2012-12-03 NOTE — Evaluation (Signed)
Occupational Therapy Evaluation Patient Details Name: [name] MRN: [id number] DOB: [birth year] Today's Date: 12/03/2012 Time: 5784-6962 OT Time Calculation (min): 9 min  OT Assessment / Plan / Recommendation History of present illness 42 y.o. s/p I&D of Lt hand.    Clinical Impression   Pt presents with below problem list. Pt independent with ADLs, PTA. Pt will benefit from acute OT to increase independence and edema management of LUE. Pt did present with some balance deficits during evaluation and using IV pole for stability.     OT Assessment  Patient needs continued OT Services    Follow Up Recommendations  No OT follow up;Supervision - Intermittent    Barriers to Discharge      Equipment Recommendations  None recommended by OT    Recommendations for Other Services    Frequency  Min 2X/week    Precautions / Restrictions Precautions Precautions: Fall   Pertinent Vitals/Pain Pain in LUE but not rated. Pt left sitting EOB as PA was coming in to work on LUE.     ADL  Eating/Feeding: Set up Where Assessed - Eating/Feeding: Edge of bed Grooming: Set up Where Assessed - Grooming: Unsupported sitting Upper Body Bathing: Set up Where Assessed - Upper Body Bathing: Unsupported sitting Lower Body Bathing: Set up;Supervision/safety Where Assessed - Lower Body Bathing: Unsupported sit to stand Upper Body Dressing: Set up Where Assessed - Upper Body Dressing: Unsupported sitting Lower Body Dressing: Minimal assistance Where Assessed - Lower Body Dressing: Unsupported sit to stand Toilet Transfer: Supervision/safety Toilet Transfer Method: Sit to Barista: Regular height toilet Tub/Shower Transfer Method: Not assessed Transfers/Ambulation Related to ADLs: Supervision ADL Comments: Educated pt to elevate LUE above heart and move fingers. Practiced regular height toilet transfer and pt using IV pole to sit. When asked if pt could ambulated without  holding onto IV pole, pt states he feels too unsteady now to not use it. Educated that elastic pants will be easier to wear to help avoid buttons. Also, went and got pt some elastic shoe laces as I suspect this will be difficult.    OT Diagnosis: Acute pain  OT Problem List: Decreased range of motion;Decreased activity tolerance;Impaired balance (sitting and/or standing);Pain;Impaired UE functional use;Decreased knowledge of use of DME or AE;Decreased knowledge of precautions;Decreased coordination OT Treatment Interventions: Self-care/ADL training;Therapeutic exercise;DME and/or AE instruction;Therapeutic activities;Patient/family education;Balance training   OT Goals(Current goals can be found in the care plan section) Acute Rehab OT Goals Patient Stated Goal: not stated OT Goal Formulation: With patient Time For Goal Achievement: 12/10/12 Potential to Achieve Goals: Good ADL Goals Pt Will Transfer to Toilet: with modified independence;ambulating;regular height toilet Pt Will Perform Toileting - Clothing Manipulation and hygiene: with modified independence;sit to/from stand Pt Will Perform Tub/Shower Transfer: Tub transfer;with supervision;ambulating Additional ADL Goal #1: Pt will be able to independently verbalize and demonstrate 2/2 edema management techniques.   Visit Information  Last OT Received On: 12/03/12 Assistance Needed: +1 History of Present Illness: 42 y.o. s/p I&D of Lt hand.        Prior Functioning     Home Living Family/patient expects to be discharged to:: Private residence Living Arrangements: Parent Available Help at Discharge: Family;Available 24 hours/day Type of Home: Mobile home Home Layout: One level Home Equipment: Walker - 2 wheels;Cane - single point;Crutches;Bedside commode Prior Function Level of Independence: Independent Communication Communication: No difficulties Dominant Hand: Right         Vision/Perception     Cognition  Cognition Arousal/Alertness: Awake/alert Behavior During Therapy: WFL for tasks assessed/performed Overall Cognitive Status: Within Functional Limits for tasks assessed    Extremity/Trunk Assessment Upper Extremity Assessment Upper Extremity Assessment: LUE deficits/detail LUE: Unable to fully assess due to immobilization     Mobility Bed Mobility Bed Mobility: Supine to Sit Supine to Sit: 6: Modified independent (Device/Increase time) Transfers Transfers: Sit to Stand;Stand to Sit Sit to Stand: 5: Supervision;From bed;From toilet Stand to Sit: 5: Supervision;To bed;To toilet     Exercise     Balance     End of Session OT - End of Session Activity Tolerance: Patient tolerated treatment well Patient left: in bed;with family/visitor present  GO     Earlie Raveling OTR/L [phone] 12/03/2012, 1:20 PM

## 2012-12-04 ENCOUNTER — Emergency Department (HOSPITAL_COMMUNITY)
Admission: EM | Admit: 2012-12-04 | Discharge: 2012-12-04 | Disposition: A | Discharge status: Home / Self Care | Payer: Medicaid Other | Attending: Emergency Medicine | Admitting: Emergency Medicine

## 2012-12-04 ENCOUNTER — Encounter (HOSPITAL_COMMUNITY): Payer: Self-pay | Admitting: Emergency Medicine

## 2012-12-04 DIAGNOSIS — Z5189 Encounter for other specified aftercare: Secondary | ICD-10-CM

## 2012-12-04 DIAGNOSIS — IMO0001 Reserved for inherently not codable concepts without codable children: Secondary | ICD-10-CM | POA: Insufficient documentation

## 2012-12-04 DIAGNOSIS — Z4801 Encounter for change or removal of surgical wound dressing: Secondary | ICD-10-CM | POA: Insufficient documentation

## 2012-12-04 DIAGNOSIS — M79609 Pain in unspecified limb: Secondary | ICD-10-CM | POA: Insufficient documentation

## 2012-12-04 DIAGNOSIS — Z792 Long term (current) use of antibiotics: Secondary | ICD-10-CM | POA: Insufficient documentation

## 2012-12-04 DIAGNOSIS — F411 Generalized anxiety disorder: Secondary | ICD-10-CM | POA: Insufficient documentation

## 2012-12-04 DIAGNOSIS — F17200 Nicotine dependence, unspecified, uncomplicated: Secondary | ICD-10-CM | POA: Insufficient documentation

## 2012-12-04 DIAGNOSIS — Z79899 Other long term (current) drug therapy: Secondary | ICD-10-CM | POA: Insufficient documentation

## 2012-12-04 DIAGNOSIS — R509 Fever, unspecified: Secondary | ICD-10-CM | POA: Insufficient documentation

## 2012-12-04 DIAGNOSIS — F319 Bipolar disorder, unspecified: Secondary | ICD-10-CM | POA: Insufficient documentation

## 2012-12-04 NOTE — ED Notes (Signed)
Pt is supposed to have an appointment with Dr. Amanda Pea at 1330.

## 2012-12-04 NOTE — ED Notes (Signed)
Per EMS: pt here with left arm injury from feral cat; pt seen here on 10/10 for same; pt given antibiotics but still having pain and fever

## 2012-12-04 NOTE — ED Notes (Signed)
Pt sts he is supposed to see hand sx today at 1330

## 2012-12-04 NOTE — ED Provider Notes (Signed)
CSN: [id number]     Arrival date & time 12/04/12  1150 History  This chart was scribed for non-physician practitioner Junious Silk, PA-C, working with Shelda Jakes, MD by Dorothey Baseman, ED Scribe. This patient was seen in room TR11C/TR11C and the patient's care was started at 12:50 PM.    Chief Complaint  Patient presents with  . Wound Check   The history is provided by the patient. No language interpreter was used.   HPI Comments: [name] is a 42 y.o. male who presents to the Emergency Department requesting a recheck of a wound that occurred 3 days ago when the patient was bitten by a feral cat. Patient was seen here on 12/01/2012 and received an x-ray of the left hand that was negative and was discharged with Augmentin. Patient reports that he had surgery to the area 2 days ago (performed by Dr. Amanda Pea) and has an appointment to follow up today at 1:30 PM. Patient reports associated sharp, throbbing pain to the area, 9/10 currently, and subjective fever that has persisted since the incident. Afebrile on arrival. Patient reports that he took oxycodone at home last night with relief, but was groggy this am when he woke up. His mother encouraged him to come to the emergency department today.   Past Medical History  Diagnosis Date  . Bipolar 1 disorder   . Anxiety    Past Surgical History  Procedure Laterality Date  . Finger surgery     History reviewed. No pertinent family history. History  Substance Use Topics  . Smoking status: Current Every Day Smoker -- 2.00 packs/day  . Smokeless tobacco: Not on file  . Alcohol Use: Yes    Review of Systems  Constitutional: Positive for fever ( subjective).  Musculoskeletal: Positive for myalgias.  All other systems reviewed and are negative.    Allergies  Review of patient's allergies indicates no known allergies.  Home Medications   Current Outpatient Rx  Name  Route  Sig  Dispense  Refill  . amoxicillin-clavulanate  (AUGMENTIN) 875-125 MG per tablet   Oral   Take 1 tablet by mouth 2 (two) times daily.   28 tablet   0   . clonazePAM (KLONOPIN) 0.5 MG tablet   Oral   Take 0.5 mg by mouth daily.           Marland Kitchen neomycin-bacitracin-polymyxin (NEOSPORIN) ointment   Topical   Apply 1 application topically once. apply to eye         . oxyCODONE (OXY IR/ROXICODONE) 5 MG immediate release tablet   Oral   Take 1-2 tablets (5-10 mg total) by mouth every 4 (four) hours as needed.   45 tablet   0   . paliperidone (INVEGA) 3 MG 24 hr tablet   Oral   Take 3 mg by mouth daily.            Triage Vitals: BP 107/73  Pulse 75  Temp(Src) 98.2 F (36.8 C) (Oral)  Resp 18  SpO2 97%  Physical Exam  Nursing note and vitals reviewed. Constitutional: He is oriented to person, place, and time. He appears well-developed and well-nourished. No distress.  HENT:  Head: Normocephalic and atraumatic.  Right Ear: External ear normal.  Left Ear: External ear normal.  Nose: Nose normal.  Eyes: Conjunctivae are normal.  Neck: Normal range of motion. No tracheal deviation present.  Cardiovascular: Normal rate, regular rhythm and normal heart sounds.   Pulmonary/Chest: Effort normal and breath sounds normal.  No stridor.  Abdominal: Soft. He exhibits no distension. There is no tenderness.  Musculoskeletal: Normal range of motion.  Neurological: He is alert and oriented to person, place, and time.  Distal sensation intact.  Skin: Skin is warm and dry. He is not diaphoretic.  Bulky dressing to left hand. Patient refused when I attempted to remove dressing. Fingers are soft without erythema. Capillary refill < 3 secs.   Psychiatric: He has a normal mood and affect. His behavior is normal.    ED Course  Procedures (including critical care time)  DIAGNOSTIC STUDIES: Oxygen Saturation is 97% on room air, normal by my interpretation.    COORDINATION OF CARE: 12:53 PM- Patient refused to let me remove the dressing.  Discussed that there are no signs of infection or complications that I can see from the tips of his fingers. Advised patient to follow up with his appointment with Dr. Amanda Pea today. Discussed treatment plan with patient at bedside and patient verbalized agreement.     Labs Review Labs Reviewed - No data to display  Imaging Review Dg Hand Complete Left  12/02/2012   CLINICAL DATA:  Patent and scratch back at 24 hr ago on the left hand. Redness and swelling and stiffness.  EXAM: LEFT HAND - COMPLETE 3+ VIEW  COMPARISON:  None.  FINDINGS: No fracture or dislocation. An old fracture of the proximal phalanx of the index finger has been reduced with 2 fixation screws. It is well healed.  There are no areas of bone resorption to suggest osteomyelitis.  Dorsal soft tissue swelling is noted. No radiopaque foreign body.  IMPRESSION: No fracture or dislocation. No radiopaque foreign body. No evidence of osteomyelitis.   Electronically Signed   By: Amie Portland M.D.   On: 12/02/2012 14:09    EKG Interpretation   None       MDM   1. Visit for wound check    Patient presents for a wound check. He did not want me to take off his dressing as he had an appointment with Dr. Amanda Pea at 1:30 pm today and wanted Dr. Amanda Pea to change the dressing. Compartment soft and neurovascularly intact. Patient will see Dr. Amanda Pea within the hour. Afebrile today. Return instructions given. Vital signs stable for discharge. Patient / Family / Caregiver informed of clinical course, understand medical decision-making process, and agree with plan.  I personally performed the services described in this documentation, which was scribed in my presence. The recorded information has been reviewed and is accurate.      Mora Bellman, PA-C 12/04/12 1527

## 2012-12-05 LAB — WOUND CULTURE: Culture: NO GROWTH

## 2012-12-06 NOTE — ED Provider Notes (Signed)
Medical screening examination/treatment/procedure(s) were performed by non-physician practitioner and as supervising physician I was immediately available for consultation/collaboration.    Kaislyn Gulas W. Leah Skora, MD 12/06/12 1018 

## 2012-12-18 ENCOUNTER — Emergency Department (HOSPITAL_COMMUNITY): Payer: Medicaid Other

## 2012-12-18 ENCOUNTER — Encounter (HOSPITAL_COMMUNITY): Payer: Self-pay | Admitting: Emergency Medicine

## 2012-12-18 ENCOUNTER — Emergency Department (HOSPITAL_COMMUNITY)
Admission: EM | Admit: 2012-12-18 | Discharge: 2012-12-18 | Disposition: A | Discharge status: Home / Self Care | Payer: Medicaid Other | Attending: Emergency Medicine | Admitting: Emergency Medicine

## 2012-12-18 DIAGNOSIS — Z8659 Personal history of other mental and behavioral disorders: Secondary | ICD-10-CM | POA: Insufficient documentation

## 2012-12-18 DIAGNOSIS — R109 Unspecified abdominal pain: Secondary | ICD-10-CM

## 2012-12-18 DIAGNOSIS — K8020 Calculus of gallbladder without cholecystitis without obstruction: Secondary | ICD-10-CM

## 2012-12-18 DIAGNOSIS — R1013 Epigastric pain: Secondary | ICD-10-CM | POA: Insufficient documentation

## 2012-12-18 DIAGNOSIS — R1011 Right upper quadrant pain: Secondary | ICD-10-CM | POA: Insufficient documentation

## 2012-12-18 DIAGNOSIS — Z8673 Personal history of transient ischemic attack (TIA), and cerebral infarction without residual deficits: Secondary | ICD-10-CM | POA: Insufficient documentation

## 2012-12-18 DIAGNOSIS — F17200 Nicotine dependence, unspecified, uncomplicated: Secondary | ICD-10-CM | POA: Insufficient documentation

## 2012-12-18 LAB — COMPREHENSIVE METABOLIC PANEL
AST: 21 U/L (ref 0–37)
Albumin: 4.1 g/dL (ref 3.5–5.2)
Alkaline Phosphatase: 75 U/L (ref 39–117)
CO2: 23 mEq/L (ref 19–32)
Calcium: 10.6 mg/dL — ABNORMAL HIGH (ref 8.4–10.5)
Creatinine, Ser: 0.77 mg/dL (ref 0.50–1.35)
GFR calc Af Amer: >90 mL/min (ref >90)
Potassium: 3.3 mEq/L — ABNORMAL LOW (ref 3.5–5.1)
Sodium: 135 mEq/L (ref 135–145)
Total Bilirubin: 0.4 mg/dL (ref 0.3–1.2)
Total Protein: 7.7 g/dL (ref 6.0–8.3)

## 2012-12-18 LAB — CBC WITH DIFFERENTIAL/PLATELET
Basophils Absolute: 0 10*3/uL (ref 0.0–0.1)
Basophils Relative: 0 % (ref 0–1)
Eosinophils Absolute: 0 10*3/uL (ref 0.0–0.7)
Eosinophils Relative: 0 % (ref 0–5)
HCT: 46.7 % (ref 39.0–52.0)
Hemoglobin: 17 g/dL (ref 13.0–17.0)
Lymphs Abs: 1.3 10*3/uL (ref 0.7–4.0)
MCH: 32.9 pg (ref 26.0–34.0)
MCHC: 36.4 g/dL — ABNORMAL HIGH (ref 30.0–36.0)
MCV: 90.3 fL (ref 78.0–100.0)
Monocytes Absolute: 0.6 10*3/uL (ref 0.1–1.0)
Platelets: 272 10*3/uL (ref 150–400)
RBC: 5.17 MIL/uL (ref 4.22–5.81)
RDW: 12.7 % (ref 11.5–15.5)
WBC: 13.2 10*3/uL — ABNORMAL HIGH (ref 4.0–10.5)

## 2012-12-18 LAB — LIPASE, BLOOD: Lipase: 24 U/L (ref 11–59)

## 2012-12-18 MED ORDER — METRONIDAZOLE 500 MG PO TABS: 500.0000 mg | ORAL_TABLET | Freq: Three times a day (TID) | ORAL | Status: DC

## 2012-12-18 MED ORDER — HYDROMORPHONE HCL PF 1 MG/ML IJ SOLN
1.0000 mg | Freq: Once | INTRAMUSCULAR | Status: AC
  Administered 2012-12-18: 1 mg via INTRAVENOUS
  Filled 2012-12-18: qty 1

## 2012-12-18 MED ORDER — PROMETHAZINE HCL 25 MG PO TABS: 25.0000 mg | ORAL_TABLET | Freq: Four times a day (QID) | ORAL | Status: DC | PRN

## 2012-12-18 MED ORDER — OXYCODONE-ACETAMINOPHEN 5-325 MG PO TABS: 1.0000 | ORAL_TABLET | ORAL | Status: DC | PRN

## 2012-12-18 MED ORDER — PIPERACILLIN-TAZOBACTAM 3.375 G IVPB 30 MIN
3.3750 g | INTRAVENOUS | Status: AC
  Administered 2012-12-18: 3.375 g via INTRAVENOUS
  Filled 2012-12-18: qty 50

## 2012-12-18 MED ORDER — CIPROFLOXACIN HCL 500 MG PO TABS: 500.0000 mg | ORAL_TABLET | Freq: Two times a day (BID) | ORAL | Status: DC

## 2012-12-18 MED ORDER — GI COCKTAIL ~~LOC~~
30.0000 mL | Freq: Once | ORAL | Status: AC
  Administered 2012-12-18: 30 mL via ORAL
  Filled 2012-12-18: qty 30

## 2012-12-18 MED ORDER — POTASSIUM CHLORIDE 10 MEQ/100ML IV SOLN
10.0000 meq | Freq: Once | INTRAVENOUS | Status: AC
  Administered 2012-12-18: 10 meq via INTRAVENOUS
  Filled 2012-12-18: qty 100

## 2012-12-18 MED ORDER — IOHEXOL 300 MG/ML  SOLN
100.0000 mL | Freq: Once | INTRAMUSCULAR | Status: AC | PRN
  Administered 2012-12-18: 100 mL via INTRAVENOUS

## 2012-12-18 MED ORDER — PROMETHAZINE HCL 25 MG/ML IJ SOLN
12.5000 mg | Freq: Once | INTRAMUSCULAR | Status: AC
  Administered 2012-12-18: 12.5 mg via INTRAVENOUS
  Filled 2012-12-18: qty 1

## 2012-12-18 MED ORDER — IOHEXOL 300 MG/ML  SOLN
50.0000 mL | Freq: Once | INTRAMUSCULAR | Status: AC | PRN
  Administered 2012-12-18: 50 mL via ORAL

## 2012-12-18 MED ORDER — GI COCKTAIL ~~LOC~~: 10.0000 mL | Freq: Once | ORAL | Status: DC

## 2012-12-18 NOTE — ED Notes (Signed)
Pt returned from CT °

## 2012-12-18 NOTE — ED Notes (Signed)
Per EMS pt comes from home c/o generalized abd pain.  Pt does drink alcohol several times a week. PMH GERD but this pain is differ.

## 2012-12-18 NOTE — ED Notes (Signed)
Pt c/o pain back in abd.  Made [place] EDP aware. No new orders at this time.

## 2012-12-18 NOTE — ED Notes (Signed)
MD at bedside. 

## 2012-12-18 NOTE — Consult Note (Signed)
I have seen and evaluated the patient with [name].  His abdominal exam is not very concerning for cholecystitis and he does not have peritonitis.  There was question of cholecystitis on CT but [place] without much inflammatory change.  I would recommend observation for now and see if this resolves clinically (he says that he has had several prior episodes similar to this but he did not come to the hospital).  If pain persists, then would recommend HIDA to r/o cholecystitis.  DT prophylaxis.

## 2012-12-18 NOTE — Consult Note (Signed)
[name] Jun 07, 1970  [id number].   Primary Care MD: none Requesting MD: Dr. Raeford Razor Chief Complaint/Reason for Consult: abdominal pain HPI: this is a 42 yo male who states he woke up this morning around 2:30am with diffuse abdominal pain.  He has had pain similar to this in the past that he states he thought was food poisoning after eating clams this summer.  He states that he had some nausea, but no emesis.  He ate tums and drank almost a whole bottle of Pepto Bismol, without help.  He started having some diarrhea.  He c/o pain that radiates to his chest, but no specific chest pain, SOB, urinary problems, fevers, or chills.  He is actually still on amoxicillin due to a cat bite about a week ago.  Due to persistent abdominal pain, the patient presented to [facility] for further evaluation.  He had a CT scan that questioned some fluid around the gallbladder with cholelithiasis, but also saw underdistention vs possible diverticulitis.  We have been asked to see the patient for possible cholecystitis.  ROS: Please see HPI, otherwise all other systems are negative.  History reviewed. No pertinent family history.  Past Medical History  Diagnosis Date  . Bipolar 1 disorder   . Anxiety     Past Surgical History  Procedure Laterality Date  . Finger surgery      Social History:  reports that he has been smoking Cigarettes.  He has been smoking about 1.00 pack per day. He does not have any smokeless tobacco history on file. He reports that he drinks alcohol. He reports that he uses illicit drugs (Marijuana).  Allergies: No Known Allergies   (Not in a hospital admission)  Blood pressure 97/57, pulse 57, temperature 98.3 F (36.8 C), temperature source Oral, resp. rate 14, SpO2 98.00%. Physical Exam: General: disheveled appearing white male who is laying in bed in NAD HEENT: head is normocephalic, atraumatic.  Sclera are noninjected.  PERRL.  Ears and nose without any masses or lesions.   Mouth is pink, but with very poor dentition. Heart: regular, rate, and rhythm.  Normal s1,s2. No obvious murmurs, gallops, or rubs noted.  Palpable radial and pedal pulses bilaterally Lungs: CTAB, no wheezes, rhonchi, or rales noted.  Respiratory effort nonlabored Abd: soft, diffusely tender, no one spot hurts more than any others, but if pushed, more tenderness in LUQ than anywhere, ND, +BS, no masses, hernias, or organomegaly MS: all 4 extremities are symmetrical with no cyanosis, clubbing, or edema. Skin: warm and dry with no masses, lesions, or rashes Psych: A&Ox3.    Results for orders placed during the hospital encounter of 12/18/12 (from the past 48 hour(s))  CBC WITH DIFFERENTIAL     Status: Abnormal   Collection Time    12/18/12  8:26 AM      Result Value Range   WBC 13.2 (*) 4.0 - 10.5 K/uL   RBC 5.17  4.22 - 5.81 MIL/uL   Hemoglobin 17.0  13.0 - 17.0 g/dL   HCT 40.9  81.1 - 91.4 %   MCV 90.3  78.0 - 100.0 fL   MCH 32.9  26.0 - 34.0 pg   MCHC 36.4 (*) 30.0 - 36.0 g/dL   RDW 78.2  95.6 - 21.3 %   Platelets 272  150 - 400 K/uL   Neutrophils Relative % 85 (*) 43 - 77 %   Neutro Abs 11.2 (*) 1.7 - 7.7 K/uL   Lymphocytes Relative 10 (*) 12 - 46 %  Lymphs Abs 1.3  0.7 - 4.0 K/uL   Monocytes Relative 5  3 - 12 %   Monocytes Absolute 0.6  0.1 - 1.0 K/uL   Eosinophils Relative 0  0 - 5 %   Eosinophils Absolute 0.0  0.0 - 0.7 K/uL   Basophils Relative 0  0 - 1 %   Basophils Absolute 0.0  0.0 - 0.1 K/uL  COMPREHENSIVE METABOLIC PANEL     Status: Abnormal   Collection Time    12/18/12  8:26 AM      Result Value Range   Sodium 135  135 - 145 mEq/L   Potassium 3.3 (*) 3.5 - 5.1 mEq/L   Chloride 97  96 - 112 mEq/L   CO2 23  19 - 32 mEq/L   Glucose, Bld 149 (*) 70 - 99 mg/dL   BUN 9  6 - 23 mg/dL   Creatinine, Ser 1.61  0.50 - 1.35 mg/dL   Calcium 09.6 (*) 8.4 - 10.5 mg/dL   Total Protein 7.7  6.0 - 8.3 g/dL   Albumin 4.1  3.5 - 5.2 g/dL   AST 21  0 - 37 U/L   ALT 36  0 -  53 U/L   Alkaline Phosphatase 75  39 - 117 U/L   Total Bilirubin 0.4  0.3 - 1.2 mg/dL   GFR calc non Af Amer >90  >90 mL/min   GFR calc Af Amer >90  >90 mL/min   Comment: (NOTE)     The eGFR has been calculated using the CKD EPI equation.     This calculation has not been validated in all clinical situations.     eGFR's persistently <90 mL/min signify possible Chronic Kidney     Disease.  LIPASE, BLOOD     Status: None   Collection Time    12/18/12  8:26 AM      Result Value Range   Lipase 24  11 - 59 U/L   Ct Abdomen Pelvis W Contrast  12/18/2012   CLINICAL DATA:  Abdominal pain  EXAM: CT ABDOMEN AND PELVIS WITH CONTRAST  TECHNIQUE: Multidetector CT imaging of the abdomen and pelvis was performed using the standard protocol following bolus administration of intravenous contrast.  CONTRAST:  OMNIPAQUE IOHEXOL 300 MG/ML  SOLN  COMPARISON:  12/09/2005  FINDINGS: Small gallstones are present. There is wall thickening. Pericholecystic fluid is suspected.  Diffuse hepatic steatosis.  Spleen, pancreas, adrenal glands are within normal limits.  Kidneys are lobulated without focal mass.  Normal appendix.  No free-fluid. No abnormal retroperitoneal adenopathy.  The descending and sigmoid colon are decompressed. This can artificially results in the appearance of wall thickening. There is suspected to be some wall thickening of the sigmoid colon. There is an area of low density within the wall on image 66 suggesting edema. There is slight stranding in the adjacent fat. Diverticulosis of the sigmoid colon is associated.  The bladder is distended.  No acute bony deformity.  Basilar dependent atelectasis.  IMPRESSION: Cholelithiasis. There is also suspected to be gallbladder wall thickening and pericholecystic fluid worrisome for inflammatory change. Ultrasound may be helpful.  The appearance of the sigmoid colon may be due to decompression with artificial prominence of the wall however an inflammatory  process such is acute diverticulitis may also be present. There is an area of low density within the wall of the sigmoid colon suggesting inflammatory change.   Electronically Signed   By: Maryclare Bean M.D.   On: 12/18/2012  12:00   Dg Abd Acute W/chest  12/18/2012   CLINICAL DATA:  Generalized abdominal pain, nausea/vomiting  EXAM: ACUTE ABDOMEN SERIES (ABDOMEN 2 VIEW & CHEST 1 VIEW)  COMPARISON:  Chest radiographs dated 02/18/2011  FINDINGS: Lungs are clear. No pleural effusion or pneumothorax.  The heart is normal in size.  Paucity of bowel gas. No findings to suggest small bowel obstruction. Mild right colonic stool burden.  No evidence of free air under the diaphragm on the upright view.  IMPRESSION: No evidence of acute cardiopulmonary disease.  No evidence of small bowel obstruction or free air.   Electronically Signed   By: Charline Bills M.D.   On: 12/18/2012 08:26       Assessment/Plan 1. Abdominal pain 2. ? Colitis/ diverticulitis 3. cholelithiasis  Plan: 1. CT scan is somewhat inconclusive as to the cause of the patient's abdominal pain.  Ultrasound reveals cholelithiasis, but no definite evidence for cholecystitis.  His CT questioned possible diverticulitis or colitis.  He is diffusely tender with pain from his chest to his groin.  This is not classic for either possibility.  There are no acute surgical indications.  We would recommend medical admission if felt appropriate by the EDP.  If patient's pain persists, he could always get a HIDA to definitively rule out the gallbladder, but my suspicion currently, is low.  We will follow along. Meng Winterton E 12/18/2012, 1:26 PM Pager: [phone]

## 2012-12-24 ENCOUNTER — Ambulatory Visit (HOSPITAL_COMMUNITY)
Admission: EM | Admit: 2012-12-24 | Discharge: 2012-12-25 | Disposition: A | Discharge status: Home / Self Care | Payer: Medicaid Other | Attending: Surgery | Admitting: Surgery

## 2012-12-24 ENCOUNTER — Encounter (HOSPITAL_COMMUNITY): Payer: Medicaid Other | Admitting: Anesthesiology

## 2012-12-24 ENCOUNTER — Encounter (HOSPITAL_COMMUNITY): Payer: Self-pay | Admitting: Emergency Medicine

## 2012-12-24 ENCOUNTER — Observation Stay (HOSPITAL_COMMUNITY): Payer: Medicaid Other | Admitting: Anesthesiology

## 2012-12-24 ENCOUNTER — Encounter (HOSPITAL_COMMUNITY)
Admission: EM | Disposition: A | Discharge status: Home / Self Care | Payer: Self-pay | Source: Home / Self Care | Attending: Emergency Medicine

## 2012-12-24 ENCOUNTER — Other Ambulatory Visit: Payer: Self-pay

## 2012-12-24 ENCOUNTER — Emergency Department (HOSPITAL_COMMUNITY): Payer: Medicaid Other

## 2012-12-24 DIAGNOSIS — K810 Acute cholecystitis: Secondary | ICD-10-CM

## 2012-12-24 DIAGNOSIS — Z79899 Other long term (current) drug therapy: Secondary | ICD-10-CM | POA: Insufficient documentation

## 2012-12-24 DIAGNOSIS — K8020 Calculus of gallbladder without cholecystitis without obstruction: Secondary | ICD-10-CM

## 2012-12-24 DIAGNOSIS — K8010 Calculus of gallbladder with chronic cholecystitis without obstruction: Secondary | ICD-10-CM

## 2012-12-24 DIAGNOSIS — K8000 Calculus of gallbladder with acute cholecystitis without obstruction: Secondary | ICD-10-CM | POA: Insufficient documentation

## 2012-12-24 DIAGNOSIS — R109 Unspecified abdominal pain: Secondary | ICD-10-CM

## 2012-12-24 HISTORY — DX: Cerebral infarction, unspecified: I63.9

## 2012-12-24 HISTORY — PX: CHOLECYSTECTOMY: SHX55

## 2012-12-24 LAB — LIPASE, BLOOD: Lipase: 38 U/L (ref 11–59)

## 2012-12-24 LAB — COMPREHENSIVE METABOLIC PANEL
Albumin: 4 g/dL (ref 3.5–5.2)
Alkaline Phosphatase: 66 U/L (ref 39–117)
BUN: 8 mg/dL (ref 6–23)
Creatinine, Ser: 0.95 mg/dL (ref 0.50–1.35)
Potassium: 3.5 mEq/L (ref 3.5–5.1)
Sodium: 139 mEq/L (ref 135–145)
Total Protein: 7.8 g/dL (ref 6.0–8.3)

## 2012-12-24 LAB — URINALYSIS, ROUTINE W REFLEX MICROSCOPIC
Glucose, UA: NEGATIVE mg/dL
Hgb urine dipstick: NEGATIVE
Leukocytes, UA: NEGATIVE
Nitrite: NEGATIVE
Specific Gravity, Urine: 1.02 (ref 1.005–1.030)
Urobilinogen, UA: 0.2 mg/dL (ref 0.0–1.0)

## 2012-12-24 LAB — CBC
HCT: 46.9 % (ref 39.0–52.0)
MCHC: 36 g/dL (ref 30.0–36.0)
Platelets: 236 10*3/uL (ref 150–400)
RDW: 13 % (ref 11.5–15.5)

## 2012-12-24 LAB — LACTIC ACID, PLASMA: Lactic Acid, Venous: 3.7 mmol/L — ABNORMAL HIGH (ref 0.5–2.2)

## 2012-12-24 SURGERY — LAPAROSCOPIC CHOLECYSTECTOMY WITH INTRAOPERATIVE CHOLANGIOGRAM
Anesthesia: General | Wound class: Contaminated

## 2012-12-24 MED ORDER — FAMOTIDINE IN NACL 20-0.9 MG/50ML-% IV SOLN
20.0000 mg | Freq: Once | INTRAVENOUS | Status: AC
  Administered 2012-12-24: 20 mg via INTRAVENOUS
  Filled 2012-12-24: qty 50

## 2012-12-24 MED ORDER — HYDROMORPHONE HCL PF 1 MG/ML IJ SOLN
1.0000 mg | INTRAMUSCULAR | Status: DC | PRN
  Administered 2012-12-24 – 2012-12-25 (×4): 2 mg via INTRAVENOUS
  Filled 2012-12-24 (×5): qty 2

## 2012-12-24 MED ORDER — MIDAZOLAM HCL 5 MG/5ML IJ SOLN
INTRAMUSCULAR | Status: DC | PRN
  Administered 2012-12-24: 2 mg via INTRAVENOUS

## 2012-12-24 MED ORDER — OXYCODONE HCL 5 MG PO TABS
5.0000 mg | ORAL_TABLET | ORAL | Status: DC | PRN
  Administered 2012-12-25: 5 mg via ORAL
  Filled 2012-12-24: qty 1

## 2012-12-24 MED ORDER — ACETAMINOPHEN 650 MG RE SUPP: 650.0000 mg | Freq: Four times a day (QID) | RECTAL | Status: DC | PRN

## 2012-12-24 MED ORDER — KCL IN DEXTROSE-NACL 20-5-0.45 MEQ/L-%-% IV SOLN
Freq: Once | INTRAVENOUS | Status: DC
  Filled 2012-12-24: qty 1000

## 2012-12-24 MED ORDER — ENOXAPARIN SODIUM 40 MG/0.4ML ~~LOC~~ SOLN
40.0000 mg | SUBCUTANEOUS | Status: DC
  Filled 2012-12-24: qty 0.4

## 2012-12-24 MED ORDER — ONDANSETRON HCL 4 MG/2ML IJ SOLN: 4.0000 mg | Freq: Four times a day (QID) | INTRAMUSCULAR | Status: DC | PRN

## 2012-12-24 MED ORDER — PROPOFOL 10 MG/ML IV BOLUS
INTRAVENOUS | Status: DC | PRN
  Administered 2012-12-24: 100 mg via INTRAVENOUS

## 2012-12-24 MED ORDER — SUCCINYLCHOLINE CHLORIDE 20 MG/ML IJ SOLN
INTRAMUSCULAR | Status: DC | PRN
  Administered 2012-12-24: 100 mg via INTRAVENOUS

## 2012-12-24 MED ORDER — IOHEXOL 350 MG/ML SOLN
100.0000 mL | Freq: Once | INTRAVENOUS | Status: AC | PRN
  Administered 2012-12-24: 100 mL via INTRAVENOUS

## 2012-12-24 MED ORDER — OXYCODONE HCL 5 MG/5ML PO SOLN: 5.0000 mg | Freq: Once | ORAL | Status: DC | PRN

## 2012-12-24 MED ORDER — LIDOCAINE HCL (CARDIAC) 20 MG/ML IV SOLN
INTRAVENOUS | Status: DC | PRN
  Administered 2012-12-24: 60 mg via INTRAVENOUS

## 2012-12-24 MED ORDER — HYDROMORPHONE HCL PF 1 MG/ML IJ SOLN
0.2500 mg | INTRAMUSCULAR | Status: DC | PRN
  Administered 2012-12-24 (×2): 0.5 mg via INTRAVENOUS

## 2012-12-24 MED ORDER — ACETAMINOPHEN 325 MG PO TABS: 650.0000 mg | ORAL_TABLET | Freq: Four times a day (QID) | ORAL | Status: DC | PRN

## 2012-12-24 MED ORDER — CLONAZEPAM 0.5 MG PO TABS
0.5000 mg | ORAL_TABLET | Freq: Every day | ORAL | Status: DC
  Administered 2012-12-25: 0.5 mg via ORAL
  Filled 2012-12-24: qty 1

## 2012-12-24 MED ORDER — ONDANSETRON HCL 4 MG/2ML IJ SOLN
INTRAMUSCULAR | Status: DC | PRN
  Administered 2012-12-24: 4 mg via INTRAVENOUS

## 2012-12-24 MED ORDER — MORPHINE SULFATE 2 MG/ML IJ SOLN
4.0000 mg | Freq: Once | INTRAMUSCULAR | Status: AC
  Administered 2012-12-24: 4 mg via INTRAVENOUS

## 2012-12-24 MED ORDER — FENTANYL CITRATE 0.05 MG/ML IJ SOLN
INTRAMUSCULAR | Status: DC | PRN
  Administered 2012-12-24 (×2): 100 ug via INTRAVENOUS
  Administered 2012-12-24: 250 ug via INTRAVENOUS

## 2012-12-24 MED ORDER — MORPHINE SULFATE 2 MG/ML IJ SOLN: 2.0000 mg | INTRAMUSCULAR | Status: DC | PRN

## 2012-12-24 MED ORDER — ONDANSETRON HCL 4 MG/2ML IJ SOLN: 4.0000 mg | Freq: Once | INTRAMUSCULAR | Status: DC

## 2012-12-24 MED ORDER — OXYCODONE HCL 5 MG PO TABS: 5.0000 mg | ORAL_TABLET | Freq: Once | ORAL | Status: DC | PRN

## 2012-12-24 MED ORDER — HYDROMORPHONE HCL PF 1 MG/ML IJ SOLN
INTRAMUSCULAR | Status: AC
  Filled 2012-12-24: qty 1

## 2012-12-24 MED ORDER — LACTATED RINGERS IV SOLN
INTRAVENOUS | Status: DC | PRN
  Administered 2012-12-24 (×2): via INTRAVENOUS

## 2012-12-24 MED ORDER — OXYCODONE-ACETAMINOPHEN 5-325 MG PO TABS
1.0000 | ORAL_TABLET | ORAL | Status: DC | PRN
  Administered 2012-12-25: 1 via ORAL
  Filled 2012-12-24: qty 1

## 2012-12-24 MED ORDER — BUPIVACAINE-EPINEPHRINE PF 0.25-1:200000 % IJ SOLN
INTRAMUSCULAR | Status: AC
  Filled 2012-12-24: qty 30

## 2012-12-24 MED ORDER — BUPIVACAINE-EPINEPHRINE 0.25% -1:200000 IJ SOLN
INTRAMUSCULAR | Status: DC | PRN
  Administered 2012-12-24: 20 mL

## 2012-12-24 MED ORDER — PALIPERIDONE ER 3 MG PO TB24
3.0000 mg | ORAL_TABLET | Freq: Every day | ORAL | Status: DC
  Administered 2012-12-25: 3 mg via ORAL
  Filled 2012-12-24: qty 1

## 2012-12-24 MED ORDER — FAMOTIDINE IN NACL 20-0.9 MG/50ML-% IV SOLN
20.0000 mg | Freq: Once | INTRAVENOUS | Status: DC
  Filled 2012-12-24: qty 50

## 2012-12-24 MED ORDER — ROCURONIUM BROMIDE 100 MG/10ML IV SOLN
INTRAVENOUS | Status: DC | PRN
  Administered 2012-12-24: 50 mg via INTRAVENOUS

## 2012-12-24 MED ORDER — NEOSTIGMINE METHYLSULFATE 1 MG/ML IJ SOLN
INTRAMUSCULAR | Status: DC | PRN
  Administered 2012-12-24: 3 mg via INTRAVENOUS

## 2012-12-24 MED ORDER — SODIUM CHLORIDE 0.9 % IR SOLN
Status: DC | PRN
  Administered 2012-12-24: 1000 mL

## 2012-12-24 MED ORDER — PIPERACILLIN-TAZOBACTAM 3.375 G IVPB
3.3750 g | Freq: Three times a day (TID) | INTRAVENOUS | Status: DC
  Administered 2012-12-24: 3.375 g via INTRAVENOUS
  Filled 2012-12-24 (×3): qty 50

## 2012-12-24 MED ORDER — ACETAMINOPHEN 325 MG PO TABS: 650.0000 mg | ORAL_TABLET | ORAL | Status: DC | PRN

## 2012-12-24 MED ORDER — POTASSIUM CHLORIDE IN NACL 20-0.9 MEQ/L-% IV SOLN
INTRAVENOUS | Status: DC
  Administered 2012-12-24 – 2012-12-25 (×2): via INTRAVENOUS
  Filled 2012-12-24 (×5): qty 1000

## 2012-12-24 MED ORDER — GLYCOPYRROLATE 0.2 MG/ML IJ SOLN
INTRAMUSCULAR | Status: DC | PRN
  Administered 2012-12-24: .4 mg via INTRAVENOUS

## 2012-12-24 SURGICAL SUPPLY — 44 items
ADH SKN CLS APL DERMABOND .7 (GAUZE/BANDAGES/DRESSINGS) ×1
APPLIER CLIP 5 13 M/L LIGAMAX5 (MISCELLANEOUS) ×2
APPLIER CLIP ROT 10 11.4 M/L (STAPLE)
APR CLP MED LRG 11.4X10 (STAPLE)
APR CLP MED LRG 5 ANG JAW (MISCELLANEOUS) ×1
BAG SPEC RTRVL LRG 6X4 10 (ENDOMECHANICALS)
BLADE SURG ROTATE 9660 (MISCELLANEOUS) IMPLANT
CANISTER SUCTION 2500CC (MISCELLANEOUS) ×2 IMPLANT
CHLORAPREP W/TINT 26ML (MISCELLANEOUS) ×2 IMPLANT
CLIP APPLIE 5 13 M/L LIGAMAX5 (MISCELLANEOUS) ×1 IMPLANT
CLIP APPLIE ROT 10 11.4 M/L (STAPLE) IMPLANT
CLSR STERI-STRIP ANTIMIC 1/2X4 (GAUZE/BANDAGES/DRESSINGS) ×1 IMPLANT
COVER MAYO STAND STRL (DRAPES) IMPLANT
COVER SURGICAL LIGHT HANDLE (MISCELLANEOUS) ×2 IMPLANT
DECANTER SPIKE VIAL GLASS SM (MISCELLANEOUS) ×2 IMPLANT
DERMABOND ADVANCED (GAUZE/BANDAGES/DRESSINGS) ×1
DERMABOND ADVANCED .7 DNX12 (GAUZE/BANDAGES/DRESSINGS) ×1 IMPLANT
DRAPE C-ARM 42X72 X-RAY (DRAPES) IMPLANT
DRAPE UTILITY 15X26 W/TAPE STR (DRAPE) ×4 IMPLANT
DRSG TEGADERM 2-3/8X2-3/4 SM (GAUZE/BANDAGES/DRESSINGS) ×1 IMPLANT
ELECT REM PT RETURN 9FT ADLT (ELECTROSURGICAL) ×2
ELECTRODE REM PT RTRN 9FT ADLT (ELECTROSURGICAL) ×1 IMPLANT
GLOVE BIOGEL PI IND STRL 8 (GLOVE) ×1 IMPLANT
GLOVE BIOGEL PI INDICATOR 8 (GLOVE) ×1
GLOVE ECLIPSE 7.5 STRL STRAW (GLOVE) ×2 IMPLANT
GOWN STRL NON-REIN LRG LVL3 (GOWN DISPOSABLE) ×4 IMPLANT
KIT BASIN OR (CUSTOM PROCEDURE TRAY) ×2 IMPLANT
KIT ROOM TURNOVER OR (KITS) ×2 IMPLANT
NS IRRIG 1000ML POUR BTL (IV SOLUTION) ×2 IMPLANT
PAD ARMBOARD 7.5X6 YLW CONV (MISCELLANEOUS) ×4 IMPLANT
POUCH SPECIMEN RETRIEVAL 10MM (ENDOMECHANICALS) IMPLANT
SCISSORS LAP 5X35 DISP (ENDOMECHANICALS) IMPLANT
SET CHOLANGIOGRAPH 5 50 .035 (SET/KITS/TRAYS/PACK) ×1 IMPLANT
SET IRRIG TUBING LAPAROSCOPIC (IRRIGATION / IRRIGATOR) ×2 IMPLANT
SLEEVE ENDOPATH XCEL 5M (ENDOMECHANICALS) ×4 IMPLANT
SPECIMEN JAR SMALL (MISCELLANEOUS) ×2 IMPLANT
SUT MNCRL AB 4-0 PS2 18 (SUTURE) ×2 IMPLANT
TOWEL OR 17X24 6PK STRL BLUE (TOWEL DISPOSABLE) ×2 IMPLANT
TOWEL OR 17X26 10 PK STRL BLUE (TOWEL DISPOSABLE) ×2 IMPLANT
TRAY LAPAROSCOPIC (CUSTOM PROCEDURE TRAY) ×2 IMPLANT
TROCAR XCEL BLUNT TIP 100MML (ENDOMECHANICALS) ×2 IMPLANT
TROCAR XCEL NON-BLD 11X100MML (ENDOMECHANICALS) IMPLANT
TROCAR XCEL NON-BLD 5MMX100MML (ENDOMECHANICALS) ×2 IMPLANT
WATER STERILE IRR 1000ML POUR (IV SOLUTION) IMPLANT

## 2012-12-24 NOTE — Preoperative (Signed)
Beta Blockers   Reason not to administer Beta Blockers:Not Applicable 

## 2012-12-24 NOTE — H&P (Signed)
Chief Complaint:  Abdominal pain HPI: [name] is a 42 year old male with a history of bipolar disorder, stroke and marijuana use who presented to [facility] with abdominal pain.  This is his 3rd ED visit, last being on the 27th at [facility] at which time a CT of abdomen and US revealed cholelithiasis, however, no fluid or thickening. He was discharged with cipro, flagyl and percocet.  He reports symptoms for over 6 months, 3-4 times per week.  Symptoms are mostly at night, exacerbated with high fatty food.  Associated with nausea and vomiting.  He was not able to tell me when his last episode started.  Most symptoms would last all night and resolve with pain medication.  He complains of nausea, fever, chills, sweats and diarrhea.  Denies melena or hematochezia.  Denies weight loss.  He drinks every other day, 1ppd smoker and recreational marijuana use.  He denies previous abdominal surgeries.  He could not elaborate on his history of a stroke, simply stated, "I don't know you can look it up."  Family history significant for CAD(father), he could not elaborate further on family history.  He denies use of NSAIDs, or blood thinners.    Past Medical History  Diagnosis Date  . Bipolar 1 disorder   . Anxiety   . Stroke     Past Surgical History  Procedure Laterality Date  . Finger surgery      Family History  Problem Relation Age of Onset  . Heart disease Mother    Social History:  reports that he has been smoking Cigarettes.  He has been smoking about 1.00 pack per day. He does not have any smokeless tobacco history on file. He reports that he drinks alcohol. He reports that he uses illicit drugs (Marijuana).  Allergies: No Known Allergies   (Not in a hospital admission)  Results for orders placed during the hospital encounter of 12/24/12 (from the past 48 hour(s))  CBC     Status: None   Collection Time    12/24/12  2:27 AM      Result Value Range   WBC 7.3  4.0 - 10.5 K/uL   RBC 5.02  4.22 -  5.81 MIL/uL   Hemoglobin 16.9  13.0 - 17.0 g/dL   HCT 16.1  09.6 - 04.5 %   MCV 93.4  78.0 - 100.0 fL   MCH 33.7  26.0 - 34.0 pg   MCHC 36.0  30.0 - 36.0 g/dL   RDW 40.9  81.1 - 91.4 %   Platelets 236  150 - 400 K/uL  COMPREHENSIVE METABOLIC PANEL     Status: Abnormal   Collection Time    12/24/12  2:27 AM      Result Value Range   Sodium 139  135 - 145 mEq/L   Potassium 3.5  3.5 - 5.1 mEq/L   Chloride 103  96 - 112 mEq/L   CO2 20  19 - 32 mEq/L   Glucose, Bld 121 (*) 70 - 99 mg/dL   BUN 8  6 - 23 mg/dL   Creatinine, Ser 7.82  0.50 - 1.35 mg/dL   Calcium 9.8  8.4 - 95.6 mg/dL   Total Protein 7.8  6.0 - 8.3 g/dL   Albumin 4.0  3.5 - 5.2 g/dL   AST 42 (*) 0 - 37 U/L   ALT 54 (*) 0 - 53 U/L   Alkaline Phosphatase 66  39 - 117 U/L   Total Bilirubin 0.3  0.3 -  1.2 mg/dL   GFR calc non Af Amer >90  >90 mL/min   GFR calc Af Amer >90  >90 mL/min   Comment: (NOTE)     The eGFR has been calculated using the CKD EPI equation.     This calculation has not been validated in all clinical situations.     eGFR's persistently <90 mL/min signify possible Chronic Kidney     Disease.  LIPASE, BLOOD     Status: None   Collection Time    12/24/12  2:27 AM      Result Value Range   Lipase 38  11 - 59 U/L  LACTIC ACID, PLASMA     Status: Abnormal   Collection Time    12/24/12  2:28 AM      Result Value Range   Lactic Acid, Venous 3.7 (*) 0.5 - 2.2 mmol/L  URINALYSIS, ROUTINE W REFLEX MICROSCOPIC     Status: Abnormal   Collection Time    12/24/12  7:02 AM      Result Value Range   Color, Urine YELLOW  YELLOW   APPearance CLEAR  CLEAR   Specific Gravity, Urine 1.020  1.005 - 1.030   pH 5.0  5.0 - 8.0   Glucose, UA NEGATIVE  NEGATIVE mg/dL   Hgb urine dipstick NEGATIVE  NEGATIVE   Bilirubin Urine NEGATIVE  NEGATIVE   Ketones, ur 15 (*) NEGATIVE mg/dL   Protein, ur NEGATIVE  NEGATIVE mg/dL   Urobilinogen, UA 0.2  0.0 - 1.0 mg/dL   Nitrite NEGATIVE  NEGATIVE   Leukocytes, UA NEGATIVE   NEGATIVE   Comment: MICROSCOPIC NOT DONE ON URINES WITH NEGATIVE PROTEIN, BLOOD, LEUKOCYTES, NITRITE, OR GLUCOSE <1000 mg/dL.   Ct Angio Abd/pel W/ And/or W/o  12/24/2012   CLINICAL DATA:  Abdominal pain out of proportion to exam  EXAM: CT ANGIOGRAPHY ABDOMEN AND PELVIS WITH CONTRAST AND WITHOUT CONTRAST  TECHNIQUE: Multidetector CT imaging of the abdomen and pelvis was performed using the standard protocol during bolus administration of intravenous contrast. Multiplanar reconstructed images including MIPs were obtained and reviewed to evaluate the vascular anatomy.  CONTRAST:  OMNIPAQUE IOHEXOL 350 MG/ML SOLN  COMPARISON:  12/18/2012  FINDINGS: BODY WALL: Unremarkable.  LOWER CHEST: Unremarkable.  ABDOMEN/PELVIS:  Liver: No focal abnormality.  Biliary: Cholelithiasis. There is increased gallbladder wall thickening, without further gallbladder distention. The wall now measures up to 1 cm in maximal thickness, but the surrounding fat does not appear particularly infiltrated however.  Pancreas: Unremarkable.  Spleen: Unremarkable.  Adrenals: Unremarkable.  Kidneys and ureters: No hydronephrosis or stone.  Bladder: Unremarkable.  Reproductive: Unremarkable.  Bowel: Colonic diverticulosis. Normal appendix. Punctate calcification or other high density in the lower duodenum. No evidence of choledocholithiasis, as above.  Retroperitoneum: No mass or adenopathy.  Peritoneum: No free fluid or gas.  Vascular: No arterial stenosis or venous occlusion. Accessory renal artery to the left upper pole.  OSSEOUS: No acute abnormalities.  Review of the MIP images confirms the above findings.  IMPRESSION: 1. No arterial stenosis or evidence of mesenteric ischemia. 2. Gallbladder wall thickening which has mildly increased since 12/18/2012. While wall thickening can be reactive, there is cholelithiasis, and cholecystitis should be considered clinically. 3. Colonic diverticulosis.   Electronically Signed   By: Tiburcio Pea  M.D.   On: 12/24/2012 05:35    Review of Systems  Constitutional: Positive for fever, malaise/fatigue and diaphoresis.  Eyes: Negative for blurred vision and double vision.  Respiratory: Negative for cough, shortness of  breath and wheezing.   Cardiovascular: Negative for chest pain and palpitations.  Gastrointestinal: Positive for nausea, vomiting and diarrhea. Negative for constipation and blood in stool.  Genitourinary: Negative for dysuria, urgency and hematuria.  Musculoskeletal: Negative for myalgias.  Neurological: Negative for dizziness, tingling, tremors, loss of consciousness, weakness and headaches.  Psychiatric/Behavioral:       Bipolar    Blood pressure 90/58, pulse 68, resp. rate 20, SpO2 97.00%. Physical Exam  Constitutional: He is oriented to person, place, and time. He appears well-developed and well-nourished. He appears distressed.  Cardiovascular: Normal rate, regular rhythm, normal heart sounds and intact distal pulses.  Exam reveals no gallop and no friction rub.   No murmur heard. Respiratory: Effort normal and breath sounds normal. No respiratory distress. He has no wheezes. He has no rales. He exhibits no tenderness.  GI: Soft. Bowel sounds are normal. He exhibits no distension and no mass. There is tenderness. There is no rebound and no guarding.  Negative murphy's sign  Musculoskeletal: He exhibits no edema and no tenderness.  Lymphadenopathy:    He has no cervical adenopathy.  Neurological: He is oriented to person, place, and time.  Skin: Skin is warm. No rash noted. He is diaphoretic. No erythema. No pallor.  Psychiatric: Judgment and thought content normal.  Flat affect     Assessment/Plan Symptomatic cholelithiasis Abdominal pain   -Admit to observation -Plan for a laparoscopic cholecystectomy.  We discussed surgical risks and complications including but not limited to bleeding, infection, death.  -Pain control -zosyn -Antiemetics -lovenox  post operatively, SCDs, mobilize -continue home meds    [name] ANP-BC 12/24/2012, 8:09 AM

## 2012-12-24 NOTE — ED Notes (Addendum)
Awaiting pt's mother to arrive. Instructed pt to give all belongings to her upon arrival.  Reported last PO intake yesterday at 1700

## 2012-12-24 NOTE — Transfer of Care (Signed)
Immediate Anesthesia Transfer of Care Note  Patient: [name]  Procedure(s) Performed: Procedure(s): LAPAROSCOPIC CHOLECYSTECTOMY (N/A)  Patient Location: PACU  Anesthesia Type:General  Level of Consciousness: awake, alert  and oriented  Airway & Oxygen Therapy: Patient Spontanous Breathing and Patient connected to nasal cannula oxygen  Post-op Assessment: Report given to PACU RN and Post -op Vital signs reviewed and stable  Post vital signs: Reviewed and stable  Complications: No apparent anesthesia complications

## 2012-12-24 NOTE — Progress Notes (Signed)
Arrived to 6n12, denies nausea, c/o abd pain 4/10, ambulated to bathroom, mother at bedside, oriented to room and surroundings

## 2012-12-24 NOTE — ED Notes (Signed)
Reminded patient we still need to get a urine sample from him. Pt states he does not have to urinate right now. I told him we have been waiting for a few hours now and asked if he could try for [place] and pt declines.

## 2012-12-24 NOTE — ED Notes (Signed)
General surgery MD, Dr. Lindie Spruce at bedside.

## 2012-12-24 NOTE — ED Provider Notes (Signed)
CSN: [id number]     Arrival date & time 12/18/12  0730 History   First MD Initiated Contact with Patient 12/18/12 [phone]     Chief Complaint  Patient presents with  . Abdominal Pain   (Consider location/radiation/quality/duration/timing/severity/associated sxs/prior Treatment) HPI  Patient is a poor historian. 42 year old male with abdominal pain. Onset last night progressively worsening. Pain is diffuse, but worse in epigastrium and right upper quadrant. Sometimes radiates to his back. Patient does take alcohol on a fairly regular basis. Nausea, but no vomiting. No diarrhea. No fevers or chills. No urinary complaints. History of reflux, but states the current symptoms feel different. Past Medical History  Diagnosis Date  . Bipolar 1 disorder   . Anxiety   . Stroke    Past Surgical History  Procedure Laterality Date  . Finger surgery     Family History  Problem Relation Age of Onset  . Heart disease Mother    History  Substance Use Topics  . Smoking status: Current Every Day Smoker -- 1.00 packs/day    Types: Cigarettes  . Smokeless tobacco: Not on file  . Alcohol Use: Yes     Comment: 3 24oz beers every other day    Review of Systems  All systems reviewed and negative, other than as noted in HPI.   Allergies  Review of patient's allergies indicates no known allergies.  Home Medications  No current outpatient prescriptions on file. BP 105/66  Pulse 69  Temp(Src) 98.3 F (36.8 C) (Oral)  Resp 17  SpO2 95% Physical Exam  Nursing note and vitals reviewed. Constitutional: He appears well-developed and well-nourished. No distress.  HENT:  Head: Normocephalic and atraumatic.  Eyes: Conjunctivae are normal. Right eye exhibits no discharge. Left eye exhibits no discharge.  Neck: Neck supple.  Cardiovascular: Normal rate, regular rhythm and normal heart sounds.  Exam reveals no gallop and no friction rub.   No murmur heard. Pulmonary/Chest: Effort normal and breath  sounds normal. No respiratory distress.  Abdominal: Soft. He exhibits no distension. There is tenderness. There is no rebound and no guarding.  Diffuse abdominal tenderness, perhaps worse in the epigastrium and left upper quadrant. No rebound or guarding. No distention.  Genitourinary:  No CVA tenderness  Musculoskeletal: He exhibits no edema and no tenderness.  Neurological: He is alert.  Skin: Skin is warm and dry.  Psychiatric: He has a normal mood and affect. His behavior is normal. Thought content normal.    ED Course  Procedures (including critical care time) Labs Review Labs Reviewed  CBC WITH DIFFERENTIAL - Abnormal; Notable for the following:    WBC 13.2 (*)    MCHC 36.4 (*)    Neutrophils Relative % 85 (*)    Neutro Abs 11.2 (*)    Lymphocytes Relative 10 (*)    All other components within normal limits  COMPREHENSIVE METABOLIC PANEL - Abnormal; Notable for the following:    Potassium 3.3 (*)    Glucose, Bld 149 (*)    Calcium 10.6 (*)    All other components within normal limits  LIPASE, BLOOD   Imaging Review Ct Angio Abd/pel W/ And/or W/o  12/24/2012   CLINICAL DATA:  Abdominal pain out of proportion to exam  EXAM: CT ANGIOGRAPHY ABDOMEN AND PELVIS WITH CONTRAST AND WITHOUT CONTRAST  TECHNIQUE: Multidetector CT imaging of the abdomen and pelvis was performed using the standard protocol during bolus administration of intravenous contrast. Multiplanar reconstructed images including MIPs were obtained and reviewed to evaluate the vascular  anatomy.  CONTRAST:  OMNIPAQUE IOHEXOL 350 MG/ML SOLN  COMPARISON:  12/18/2012  FINDINGS: BODY WALL: Unremarkable.  LOWER CHEST: Unremarkable.  ABDOMEN/PELVIS:  Liver: No focal abnormality.  Biliary: Cholelithiasis. There is increased gallbladder wall thickening, without further gallbladder distention. The wall now measures up to 1 cm in maximal thickness, but the surrounding fat does not appear particularly infiltrated however.   Pancreas: Unremarkable.  Spleen: Unremarkable.  Adrenals: Unremarkable.  Kidneys and ureters: No hydronephrosis or stone.  Bladder: Unremarkable.  Reproductive: Unremarkable.  Bowel: Colonic diverticulosis. Normal appendix. Punctate calcification or other high density in the lower duodenum. No evidence of choledocholithiasis, as above.  Retroperitoneum: No mass or adenopathy.  Peritoneum: No free fluid or gas.  Vascular: No arterial stenosis or venous occlusion. Accessory renal artery to the left upper pole.  OSSEOUS: No acute abnormalities.  Review of the MIP images confirms the above findings.  IMPRESSION: 1. No arterial stenosis or evidence of mesenteric ischemia. 2. Gallbladder wall thickening which has mildly increased since 12/18/2012. While wall thickening can be reactive, there is cholelithiasis, and cholecystitis should be considered clinically. 3. Colonic diverticulosis.   Electronically Signed   By: Tiburcio Pea M.D.   On: 12/24/2012 05:35    EKG Interpretation   None       MDM   1. Abdominal pain    42 year old male with abdominal pain. Patient is not a particularly good historian. Question of cholecystitis on CT. Surgery was consulted and followup ultrasound was performed. She does have stones, but not overly convincing for cholecystitis based on the ultrasound. Patient's abdominal pain is relatively diffuse. Possible diverticulitis?. Will treat as such at this point. The patient is appropriate for outpatient treatment at this time. Return precautions were discussed.    Raeford Razor, MD 12/24/12 [phone]

## 2012-12-24 NOTE — ED Notes (Signed)
Report received, assumed care.  

## 2012-12-24 NOTE — ED Notes (Signed)
CT contacted regarding pt transfer to CT scan and was informed pt is next to be transported.

## 2012-12-24 NOTE — ED Provider Notes (Signed)
CSN: [id number]     Arrival date & time 12/24/12  0120 History   None    No chief complaint on file.  (Consider location/radiation/quality/duration/timing/severity/associated sxs/prior Treatment) HPI Please note that this is a late entry. This patient was seen and examined by me upon his presentation to the emergency department. He is a 42 year old woman who is in generally good health and has no history of previous abdominal surgeries.  Patient presents to the emergency department for the second time in the past 3 days with complaints of abdominal pain. He was seen at [facility] long emergency department on October 27 for abdominal pain. At that time, his labs were notable for white blood cell count of 12,500. He had a right upper quadrant ultrasound which was unremarkable. Here contrasted CT of the abdomen and pelvis which was notable for questionable thickening of the wall of the sigmoid colon. Diverticulitis was not appreciated. The radiologist differential included normal decompressed colon.   The patient was discharged with prescription for metronidazole, ciprofloxacin, promethazine and Percocet. The patient states "I need more oxys".  The patient says his pain has been ongoing. It has neither improved nor worsened. He denies fever. He also notes nausea, vomiting which has been NBNB. No diarrhea, bloody stools and genitourinary symptoms.  Despite documentation which suggest that the patient was [facility] to the [facility] for outpatient followup. The patient says that he was not given any followup. He has not been seen again since his discharge from the emergency department on October 27.   Past Medical History  Diagnosis Date  . Bipolar 1 disorder   . Anxiety    Past Surgical History  Procedure Laterality Date  . Finger surgery     No family history on file. History  Substance Use Topics  . Smoking status: Current Every Day Smoker -- 1.00 packs/day    Types: Cigarettes  .  Smokeless tobacco: Not on file  . Alcohol Use: Yes     Comment: 3 24oz beers every other day    Review of Systems 10 point review of systems obtained and is negative with the exception symptoms noted above. Allergies  Review of patient's allergies indicates no known allergies.  Home Medications   Current Outpatient Rx  Name  Route  Sig  Dispense  Refill  . ciprofloxacin (CIPRO) 500 MG tablet   Oral   Take 1 tablet (500 mg total) by mouth every 12 (twelve) hours.   14 tablet   0   . clonazePAM (KLONOPIN) 0.5 MG tablet   Oral   Take 0.5 mg by mouth daily.          . metroNIDAZOLE (FLAGYL) 500 MG tablet   Oral   Take 1 tablet (500 mg total) by mouth 3 (three) times daily.   21 tablet   0   . oxyCODONE-acetaminophen (PERCOCET/ROXICET) 5-325 MG per tablet   Oral   Take 1-2 tablets by mouth every 4 (four) hours as needed for pain.   15 tablet   0   . paliperidone (INVEGA) 3 MG 24 hr tablet   Oral   Take 3 mg by mouth daily.           . promethazine (PHENERGAN) 25 MG tablet   Oral   Take 1 tablet (25 mg total) by mouth every 6 (six) hours as needed for nausea.   12 tablet   0   . amoxicillin-clavulanate (AUGMENTIN) 875-125 MG per tablet   Oral   Take 1  tablet by mouth 2 (two) times daily. He is to take for 14 days. His pharmacy filled this for him on 12/03/12 and he has not completed the regimen.          Please see RN notes for VS performed while on down time.  Physical Exam Gen: well developed and well nourished appearing, the patient is pacing back and forth in his ran making very mild grunting sounds with every  Expiration. He has cursed at [employer]. Head: NCAT Eyes: PERL, EOMI Nose: no epistaixis or rhinorrhea Mouth/throat: mucosa is moist and pink Neck: supple, no stridor Lungs: CTA B, no wheezing, rhonchi or rales CV: RRR,. No murmur Abd: soft, mildly tender over the RUQ, normal BS, nondistended Back: no ttp, no cva ttp Skin: warm and  dry Neuro: CN ii-xii grossly inlntact, no focal deficits Psyche; agitated affect oriented x 4.   ED Course  Procedures (including critical care time) Labs Review Results for orders placed during the hospital encounter of 12/24/12 (from the past 24 hour(s))  CBC     Status: None   Collection Time    12/24/12  2:27 AM      Result Value Range   WBC 7.3  4.0 - 10.5 K/uL   RBC 5.02  4.22 - 5.81 MIL/uL   Hemoglobin 16.9  13.0 - 17.0 g/dL   HCT 40.9  81.1 - 91.4 %   MCV 93.4  78.0 - 100.0 fL   MCH 33.7  26.0 - 34.0 pg   MCHC 36.0  30.0 - 36.0 g/dL   RDW 78.2  95.6 - 21.3 %   Platelets 236  150 - 400 K/uL  LACTIC ACID, PLASMA     Status: Abnormal   Collection Time    12/24/12  2:28 AM      Result Value Range   Lactic Acid, Venous 3.7 (*) 0.5 - 2.2 mmol/L   CT ANGIOGRAPHY ABDOMEN AND PELVIS WITH CONTRAST AND WITHOUT CONTRAST  TECHNIQUE:  Multidetector CT imaging of the abdomen and pelvis was performed  using the standard protocol during bolus administration of  intravenous contrast. Multiplanar reconstructed images including  MIPs were obtained and reviewed to evaluate the vascular anatomy.  CONTRAST: OMNIPAQUE IOHEXOL 350 MG/ML SOLN  COMPARISON: 12/18/2012  FINDINGS:  BODY WALL: Unremarkable.  LOWER CHEST: Unremarkable.  ABDOMEN/PELVIS:  Liver: No focal abnormality.  Biliary: Cholelithiasis. There is increased gallbladder wall  thickening, without further gallbladder distention. The wall now  measures up to 1 cm in maximal thickness, but the surrounding fat  does not appear particularly infiltrated however.  Pancreas: Unremarkable.  Spleen: Unremarkable.  Adrenals: Unremarkable.  Kidneys and ureters: No hydronephrosis or stone.  Bladder: Unremarkable.  Reproductive: Unremarkable.  Bowel: Colonic diverticulosis. Normal appendix. Punctate  calcification or other high density in the lower duodenum. No  evidence of choledocholithiasis, as above.  Retroperitoneum: No  mass or adenopathy.  Peritoneum: No free fluid or gas.  Vascular: No arterial stenosis or venous occlusion. Accessory renal  artery to the left upper pole.  OSSEOUS: No acute abnormalities.  Review of the MIP images confirms the above findings.  IMPRESSION:  1. No arterial stenosis or evidence of mesenteric ischemia.  2. Gallbladder wall thickening which has mildly increased since  12/18/2012. While wall thickening can be reactive, there is  cholelithiasis, and cholecystitis should be considered clinically.  3. Colonic diverticulosis.  Electronically Signed  By: Tiburcio Pea M.D.  On: 12/24/2012 05:35   MDM  DDX: gastritis, PUD, GERD,  pancreatitis, gallbladder disease, SBO, colitis, UTI, enteritis, ischemic bowel  We are managing supportively with IVF, antiemetic, pain medication. Leukocytosis has resolved. However, the patient's lactic acid level is significantly elevated. We will perform a CTA of the abd/pelvis for further evaluation.   Patient with history, examination and CT abd/pelvis which are most consistent with diagnosis of cholecystitis. Has not been able to keep down fluids at home, he says. Pain improved after multiple doses of pain medication here in ED but "starting to come back".  I have paged [facility] to request consultation.   1610: Case discussed with Dr. Corliss Skains. Oncoming [facility] to consult. We will keep NPO with IVF.   Brandt Loosen, MD 12/24/12 [phone]

## 2012-12-24 NOTE — Anesthesia Postprocedure Evaluation (Signed)
  Anesthesia Post-op Note  Patient: [name]  Procedure(s) Performed: Procedure(s): LAPAROSCOPIC CHOLECYSTECTOMY (N/A)  Patient Location: PACU  Anesthesia Type:General  Level of Consciousness: awake and alert   Airway and Oxygen Therapy: Patient Spontanous Breathing  Post-op Pain: moderate  Post-op Assessment: Post-op Vital signs reviewed, Patient's Cardiovascular Status Stable and Respiratory Function Stable  Post-op Vital Signs: Reviewed  Filed Vitals:   12/24/12 1228  BP: 118/82  Pulse: 71  Temp: 36.5 C  Resp: 18    Complications: No apparent anesthesia complications

## 2012-12-24 NOTE — Anesthesia Preprocedure Evaluation (Addendum)
Anesthesia Evaluation  Patient identified by MRN, date of birth, ID band Patient awake    Reviewed: Allergy & Precautions, H&P , NPO status , Patient's Chart, lab work & pertinent test results  Airway Mallampati: II TM Distance: >3 FB Neck ROM: Full    Dental no notable dental hx. (+) Poor Dentition and Dental Advisory Given   Pulmonary Current Smoker,  breath sounds clear to auscultation  Pulmonary exam normal       Cardiovascular negative cardio ROS  Rhythm:Regular Rate:Normal     Neuro/Psych PSYCHIATRIC DISORDERS Anxiety Bipolar Disorder CVA negative neurological ROS     GI/Hepatic negative GI ROS, Neg liver ROS,   Endo/Other  negative endocrine ROS  Renal/GU negative Renal ROS  negative genitourinary   Musculoskeletal   Abdominal   Peds  Hematology negative hematology ROS (+)   Anesthesia Other Findings   Reproductive/Obstetrics negative OB ROS                          Anesthesia Physical Anesthesia Plan  ASA: II  Anesthesia Plan: General   Post-op Pain Management:    Induction: Intravenous, Rapid sequence and Cricoid pressure planned  Airway Management Planned: Oral ETT  Additional Equipment:   Intra-op Plan:   Post-operative Plan: Extubation in OR  Informed Consent: I have reviewed the patients History and Physical, chart, labs and discussed the procedure including the risks, benefits and alternatives for the proposed anesthesia with the patient or authorized representative who has indicated his/her understanding and acceptance.   Dental advisory given  Plan Discussed with: CRNA  Anesthesia Plan Comments:        Anesthesia Quick Evaluation

## 2012-12-24 NOTE — H&P (Signed)
Patient has RUQ tenderness, and to me he does have a Murphy's sign.  His tenderness diffusely is a bit confusing, but the patient may be overreacting.  Will go ahead and remove gallbladder today.  Marta Lamas. Gae Bon, MD, FACS [phone] [facility]

## 2012-12-24 NOTE — Op Note (Signed)
OPERATIVE REPORT  DATE OF OPERATION: 12/24/2012  PATIENT:  [name]  42 y.o. male  PRE-OPERATIVE DIAGNOSIS:  Acute cholecystitis  POST-OPERATIVE DIAGNOSIS:  same  PROCEDURE:  Procedure(s): LAPAROSCOPIC CHOLECYSTECTOMY  SURGEON:  Surgeon(s): Cherylynn Ridges, MD  ASSISTANT: None  ANESTHESIA:   general  EBL: <50 ml  BLOOD ADMINISTERED: none  DRAINS: none   SPECIMEN:  Source of Specimen:  Gallbladder  COUNTS CORRECT:  YES  PROCEDURE DETAILS: The patient was taken to the operating room and placed on the table in the supine position.  After an adequate endotracheal anesthetic was administered, he was prepped with ChloroPrep, and then draped in the usual manner exposing the entire abdomen laterally, inferiorly and up  to the costal margins.  After a proper timeout was performed including identifying the patient and the procedure to be performed, a supraumbilical 1.5cm midline incision was made using a #15 blade.  This was taken down to the fascia which was then incised with a #15 blade.  The edges of the fascia were tented up with Kocher clamps as the preperitoneal space was penetrated with a Kelly clamp into the peritoneum.  Once this was done, a pursestring suture of 0 Vicryl was passed around the fascial opening.  This was subsequently used to secure the [facility] cannula which was passed into the peritoneal cavity.  Once the [facility] cannula was in place, carbon dioxide gas was insufflated into the peritoneal cavity up to a maximal intra-abdominal pressure of 15mm Hg.The laparoscope, with attached camera and light source, was passed into the peritoneal cavity to visualize the direct insertion of two right upper quadrant 5mm cannulas, and a sup-xiphoid 11mm cannula.  Once all cannulas were in place, the dissection was begun.  Two ratcheted graspers were attached to the dome and infundibulum of the gallbladder and retracted towards the anterior abdominal wall and the right upper  quadrant.  Using cautery attached to a dissecting forceps, the peritoneum overlaying the triangle of Chalot and the hepatoduodenal triangle was dissected away exposing the cystic duct and the cystic artery.  A clip was placed on the gallbladder side of the cystic duct, and then the distal cystic duct was clipped multiple times then transected. The patient had normal preoperative LFTs, and a single large stone was suggested at the neck of the gallbladder.   The gallbladder was then dissected out of the hepatic bed without event.  It was retrieved from the abdomen (using an EndoCatch bag) without event.  Once the gallbladder was removed, the bed was inspected for hemostasis.  Once excellent hemostasis was obtained all gas and fluids were aspirated from above the liver, then the cannulas were removed.  The supraumbilical incision was closed using the pursestring suture which was in place.  0.25% bupivicaine with epinephrine was injected at all sites.  All 10mm or greater cannula sites were close using a running subcuticular stitch of 4-0 Monocryl.  5.79mm cannula sites were closed with Dermabond only.Steri-Strips and Tagaderm were used to complete the dressings at all sites.  At this point all needle, sponge, and instrument counts were correct.The patient was awakened from anesthesia and taken to the PACU in stable condition.  PATIENT DISPOSITION:  PACU - hemodynamically stable.   Ozell Ferrera O 11/2/201411:30 AM

## 2012-12-24 NOTE — ED Notes (Signed)
Pt to ED via EMS with c/o abdominal pain, onset yesterday at 1200. Pt was seen at [facility] for the same x2 days. Pt states he was discharged on percocet prescription 15 tablet, pt states he ran out of pain med.

## 2012-12-25 MED ORDER — OXYCODONE-ACETAMINOPHEN 5-325 MG PO TABS: 1.0000 | ORAL_TABLET | ORAL | Status: DC | PRN

## 2012-12-25 MED ORDER — ACETAMINOPHEN 325 MG PO TABS: 650.0000 mg | ORAL_TABLET | Freq: Four times a day (QID) | ORAL | Status: DC | PRN

## 2012-12-25 NOTE — Discharge Summary (Signed)
Physician Discharge Summary  Patient ID: [name] MRN: [id number] DOB/AGE: [birth year] 42 y.o.  Admit date: 12/24/2012 Discharge date: 12/25/2012  Admitting Diagnosis: Acute cholecystitis   Discharge Diagnosis Patient Active Problem List   Diagnosis Date Noted  . Abdominal pain 12/18/2012  acute cholecystitis   Consultants none  Imaging: Ct Angio Abd/pel W/ And/or W/o  12/24/2012   CLINICAL DATA:  Abdominal pain out of proportion to exam  EXAM: CT ANGIOGRAPHY ABDOMEN AND PELVIS WITH CONTRAST AND WITHOUT CONTRAST  TECHNIQUE: Multidetector CT imaging of the abdomen and pelvis was performed using the standard protocol during bolus administration of intravenous contrast. Multiplanar reconstructed images including MIPs were obtained and reviewed to evaluate the vascular anatomy.  CONTRAST:  OMNIPAQUE IOHEXOL 350 MG/ML SOLN  COMPARISON:  12/18/2012  FINDINGS: BODY WALL: Unremarkable.  LOWER CHEST: Unremarkable.  ABDOMEN/PELVIS:  Liver: No focal abnormality.  Biliary: Cholelithiasis. There is increased gallbladder wall thickening, without further gallbladder distention. The wall now measures up to 1 cm in maximal thickness, but the surrounding fat does not appear particularly infiltrated however.  Pancreas: Unremarkable.  Spleen: Unremarkable.  Adrenals: Unremarkable.  Kidneys and ureters: No hydronephrosis or stone.  Bladder: Unremarkable.  Reproductive: Unremarkable.  Bowel: Colonic diverticulosis. Normal appendix. Punctate calcification or other high density in the lower duodenum. No evidence of choledocholithiasis, as above.  Retroperitoneum: No mass or adenopathy.  Peritoneum: No free fluid or gas.  Vascular: No arterial stenosis or venous occlusion. Accessory renal artery to the left upper pole.  OSSEOUS: No acute abnormalities.  Review of the MIP images confirms the above findings.  IMPRESSION: 1. No arterial stenosis or evidence of mesenteric ischemia. 2. Gallbladder wall thickening  which has mildly increased since 12/18/2012. While wall thickening can be reactive, there is cholelithiasis, and cholecystitis should be considered clinically. 3. Colonic diverticulosis.   Electronically Signed   By: Tiburcio Pea M.D.   On: 12/24/2012 05:35    Procedures Laparoscopic cholecystectomy(Dr. Lindie Spruce 12/24/12)  Hospital Course:  [name] is a 42 year old male with a history of bipolar disorder, stroke and marijuana use who presented to [facility] with abdominal pain.  This was his 3rd ED visit, last being on the 27th at [facility] at which time a CT of abdomen and US revealed cholelithiasis, however, no fluid or thickening.  Due to recurrence of symptoms, he underwent a laparoscopic cholecystectomy.  Tolerated procedure well and was transferred to the floor.  Diet was advanced as tolerated.  On POD #1, the patient was voiding well, tolerating diet, ambulating well, pain well controlled, vital signs stable, incisions c/d/i and felt stable for discharge home.  Patient will follow up in our office in 3 weeks and knows to call with questions or concerns.  Physical Exam: General:  Alert, NAD, pleasant, comfortable Abd:  Soft, ND, mild tenderness, incisions C/D/I,    Medication List    STOP taking these medications       amoxicillin-clavulanate 875-125 MG per tablet  Commonly known as:  AUGMENTIN     ciprofloxacin 500 MG tablet  Commonly known as:  CIPRO     metroNIDAZOLE 500 MG tablet  Commonly known as:  FLAGYL      TAKE these medications       acetaminophen 325 MG tablet  Commonly known as:  TYLENOL  Take 2 tablets (650 mg total) by mouth every 6 (six) hours as needed.     clonazePAM 0.5 MG tablet  Commonly known as:  KLONOPIN  Take 0.5  mg by mouth daily.     oxyCODONE-acetaminophen 5-325 MG per tablet  Commonly known as:  PERCOCET/ROXICET  Take 1-2 tablets by mouth every 4 (four) hours as needed for pain.     oxyCODONE-acetaminophen 5-325 MG per tablet  Commonly known as:   PERCOCET/ROXICET  Take 1-2 tablets by mouth every 4 (four) hours as needed.     paliperidone 3 MG 24 hr tablet  Commonly known as:  INVEGA  Take 3 mg by mouth daily.     promethazine 25 MG tablet  Commonly known as:  PHENERGAN  Take 1 tablet (25 mg total) by mouth every 6 (six) hours as needed for nausea.             Follow-up Information   Follow up with [facility] On 01/16/2013. (appt time: 1:30, ARRIVE NO LATER THAN 1PM)    Contact information:   [address]   [place] [address] [phone]       Signed: Ashok Norris, [facility] [phone]  12/25/2012, 10:08 AM

## 2012-12-25 NOTE — Discharge Summary (Signed)
I have seen and examined the patient and agree with the assessment and plans.  Shahil Speegle A. Scottlynn Lindell  MD, FACS  

## 2012-12-26 ENCOUNTER — Encounter (HOSPITAL_COMMUNITY): Payer: Self-pay | Admitting: General Surgery

## 2012-12-27 ENCOUNTER — Telehealth (INDEPENDENT_AMBULATORY_CARE_PROVIDER_SITE_OTHER): Payer: Self-pay

## 2012-12-27 DIAGNOSIS — G8918 Other acute postprocedural pain: Secondary | ICD-10-CM

## 2012-12-27 DIAGNOSIS — R112 Nausea with vomiting, unspecified: Secondary | ICD-10-CM

## 2012-12-27 LAB — CBC WITH DIFFERENTIAL/PLATELET
Basophils Absolute: 0 10*3/uL (ref 0.0–0.1)
Basophils Relative: 0 % (ref 0–1)
HCT: 42.1 % (ref 39.0–52.0)
Hemoglobin: 15 g/dL (ref 13.0–17.0)
Lymphocytes Relative: 10 % — ABNORMAL LOW (ref 12–46)
MCHC: 35.6 g/dL (ref 30.0–36.0)
MCV: 92.9 fL (ref 78.0–100.0)
Monocytes Absolute: 1 10*3/uL (ref 0.1–1.0)
Monocytes Relative: 9 % (ref 3–12)
Neutro Abs: 8.6 10*3/uL — ABNORMAL HIGH (ref 1.7–7.7)
Neutrophils Relative %: 79 % — ABNORMAL HIGH (ref 43–77)
Platelets: 220 10*3/uL (ref 150–400)
RBC: 4.53 MIL/uL (ref 4.22–5.81)
WBC: 10.9 10*3/uL — ABNORMAL HIGH (ref 4.0–10.5)

## 2012-12-27 LAB — COMPREHENSIVE METABOLIC PANEL
ALT: 65 U/L — ABNORMAL HIGH (ref 0–53)
AST: 56 U/L — ABNORMAL HIGH (ref 0–37)
Albumin: 3.9 g/dL (ref 3.5–5.2)
BUN: 5 mg/dL — ABNORMAL LOW (ref 6–23)
CO2: 26 mEq/L (ref 19–32)
Calcium: 8.8 mg/dL (ref 8.4–10.5)
Chloride: 102 mEq/L (ref 96–112)
Creat: 0.72 mg/dL (ref 0.50–1.35)
Glucose, Bld: 101 mg/dL — ABNORMAL HIGH (ref 70–99)
Potassium: 3.9 mEq/L (ref 3.5–5.3)

## 2012-12-27 NOTE — Telephone Encounter (Signed)
Pt called in 3 days s/p lap chole. States he is having to take 3-4 percocet at a time and he is still having abdominal pain with nausea. Paged Dr Lindie Spruce and he wants stat lab work done on pt. Advised patient to go to [facility] and we will call him with results after we discuss them with Dr Lindie Spruce.

## 2012-12-28 ENCOUNTER — Telehealth (INDEPENDENT_AMBULATORY_CARE_PROVIDER_SITE_OTHER): Payer: Self-pay | Admitting: General Surgery

## 2012-12-28 ENCOUNTER — Other Ambulatory Visit (INDEPENDENT_AMBULATORY_CARE_PROVIDER_SITE_OTHER): Payer: Self-pay

## 2012-12-28 DIAGNOSIS — R112 Nausea with vomiting, unspecified: Secondary | ICD-10-CM

## 2012-12-28 DIAGNOSIS — G8918 Other acute postprocedural pain: Secondary | ICD-10-CM

## 2012-12-28 NOTE — Telephone Encounter (Signed)
Per Dr Lindie Spruce he wants to get a [facility] on pt to rule out bile leak. Will arrange order for test and an appointment and will call him back with info.

## 2012-12-28 NOTE — Telephone Encounter (Signed)
Spoke with pt and informed him that i have set him up for a [facility] hepato on 12/29/12 at 12:30 located at [facility] radiology.

## 2012-12-29 ENCOUNTER — Encounter (HOSPITAL_COMMUNITY): Payer: Self-pay

## 2012-12-29 ENCOUNTER — Encounter (HOSPITAL_COMMUNITY): Payer: Medicaid Other

## 2012-12-29 ENCOUNTER — Encounter (HOSPITAL_COMMUNITY)
Admission: RE | Admit: 2012-12-29 | Discharge: 2012-12-29 | Disposition: A | Discharge status: Home / Self Care | Payer: Medicaid Other | Source: Ambulatory Visit | Attending: General Surgery | Admitting: General Surgery

## 2012-12-29 ENCOUNTER — Telehealth (INDEPENDENT_AMBULATORY_CARE_PROVIDER_SITE_OTHER): Payer: Self-pay

## 2012-12-29 ENCOUNTER — Ambulatory Visit (HOSPITAL_COMMUNITY): Payer: Medicaid Other

## 2012-12-29 DIAGNOSIS — G8918 Other acute postprocedural pain: Secondary | ICD-10-CM

## 2012-12-29 DIAGNOSIS — R112 Nausea with vomiting, unspecified: Secondary | ICD-10-CM | POA: Insufficient documentation

## 2012-12-29 MED ORDER — TECHNETIUM TC 99M MEBROFENIN IV KIT
5.5000 | PACK | Freq: Once | INTRAVENOUS | Status: AC | PRN
  Administered 2012-12-29: 5.5 via INTRAVENOUS

## 2012-12-29 MED ORDER — OXYCODONE-ACETAMINOPHEN 5-325 MG PO TABS: 1.0000 | ORAL_TABLET | Freq: Four times a day (QID) | ORAL | Status: DC | PRN

## 2012-12-29 NOTE — Telephone Encounter (Signed)
Called patient and let him know Dr Stormy Fabian a refill of perocet. It is signed and up front for pick up.

## 2012-12-29 NOTE — Addendum Note (Signed)
Addended by: Brennan Bailey on: 12/29/2012 03:56 PM   Modules accepted: Orders

## 2012-12-29 NOTE — Telephone Encounter (Signed)
Pt states he is just leaving radiology and he needs a refill on his Percocet.  He is completely out.  Dr Lindie Spruce did his surgery.  I told him we require 24 hours to get prescriptions filled and he needs to call and give notice before he is out.  I said I will let Dr Lindie Spruce know but I don't know if we can get it in time since it is Friday at 3 pm

## 2013-01-01 ENCOUNTER — Telehealth (INDEPENDENT_AMBULATORY_CARE_PROVIDER_SITE_OTHER): Payer: Self-pay | Admitting: *Deleted

## 2013-01-01 NOTE — Telephone Encounter (Signed)
Patient called back today requesting another refill of Percocet.  I asked the patient about the refill he received on Friday however patient states he is in severe pain and has already taken all of those.  Patient states he is having to take 2 tablets every 4 hours to try to help with the pain.  Patient also asking about results of recent test.  Explained to patient that I will send a message to Dr. Lindie Spruce to ask about another refill and test results then we will let him know.  Patient asking how fast he will get a refill, explained I will send the message now however it can take up to 24 hours for a response.  Patient states understanding of this.

## 2013-01-02 ENCOUNTER — Other Ambulatory Visit (INDEPENDENT_AMBULATORY_CARE_PROVIDER_SITE_OTHER): Payer: Self-pay

## 2013-01-02 ENCOUNTER — Other Ambulatory Visit (HOSPITAL_COMMUNITY): Payer: Self-pay

## 2013-01-02 ENCOUNTER — Telehealth (INDEPENDENT_AMBULATORY_CARE_PROVIDER_SITE_OTHER): Payer: Self-pay

## 2013-01-02 ENCOUNTER — Telehealth (INDEPENDENT_AMBULATORY_CARE_PROVIDER_SITE_OTHER): Payer: Self-pay | Admitting: General Surgery

## 2013-01-02 DIAGNOSIS — R197 Diarrhea, unspecified: Secondary | ICD-10-CM

## 2013-01-02 DIAGNOSIS — G8918 Other acute postprocedural pain: Secondary | ICD-10-CM

## 2013-01-02 NOTE — Telephone Encounter (Signed)
OK to renew oxycodone.  #50.   I strongly recommend he takes naproxen 500 mg by mouth twice a day as well.  He can offer Robaxin 750 mg by mouth every 6 hours when necessary muscle spasms.   ce pack.   Heating pad.   Have him [facility] clinic or Dr. Lindie Spruce next week.  Managing Pain  Pain after surgery or related to activity is often due to strain/injury to muscle, tendon, nerves and/or incisions.  This pain is usually short-term and will improve in a few months.   Many people find it helpful to do the following things TOGETHER to help speed the process of healing and to get back to regular activity more quickly:  1. Avoid heavy physical activity a.  no lifting greater than 20 pounds b. Do not "push through" the pain.  Listen to your body and avoid positions and maneuvers than reproduce the pain c. Walking is okay as tolerated, but go slowly and stop when getting sore.  d. Remember: If it hurts to do it, then don't do it! 2. Take Anti-inflammatory medication  a. Take with food/snack around the clock for 1-2 weeks i. This helps the muscle and nerve tissues become less irritable and calm down faster b. Choose ONE of the following over-the-counter medications: i. Naproxen 220mg  tabs (ex. Aleve) 1-2 pills twice a day  ii. Ibuprofen 200mg  tabs (ex. Advil, Motrin) 3-4 pills with every meal and just before bedtime iii. Acetaminophen 500mg  tabs (Tylenol) 1-2 pills with every meal and just before bedtime 3. Use a Heating pad or Ice/Cold Pack a. 4-6 times a day b. May use warm bath/hottub  or showers 4. Try Gentle Massage and/or Stretching  a. at the area of pain many times a day b. stop if you feel pain - do not overdo it  Try these steps together to help you body heal faster and avoid making things get worse.  Doing just one of these things may not be enough.    If you are not getting better after two weeks or are noticing you are getting worse, contact our office for further advice; we may need to  re-evaluate you & see what other things we can do to help.

## 2013-01-02 NOTE — Telephone Encounter (Signed)
I called patient back after speaking to Dr Lindie Spruce. He wants patient to get labs tomorrow and then be seen in urge. Patient is okay with this plan. We will address his narcotic refill at his visit tomorrow.

## 2013-01-02 NOTE — Telephone Encounter (Signed)
LMOM to have pt call me back regarding refill request

## 2013-01-02 NOTE — Telephone Encounter (Signed)
Patient calling in for hepatobiliary scan results. I made him aware it did not show a bile leak. He states he is still having pain and some nausea. No vomiting. He is asking for a refill of his oxycodone. Please advise. Patient can be reached at [phone]. Dr Lindie Spruce not available- message forwarded to our urgent office MD.

## 2013-01-03 ENCOUNTER — Encounter (INDEPENDENT_AMBULATORY_CARE_PROVIDER_SITE_OTHER): Payer: Medicaid Other | Admitting: Surgery

## 2013-01-05 ENCOUNTER — Ambulatory Visit (HOSPITAL_COMMUNITY): Payer: Self-pay

## 2013-01-16 ENCOUNTER — Ambulatory Visit (INDEPENDENT_AMBULATORY_CARE_PROVIDER_SITE_OTHER): Payer: Medicaid Other | Admitting: General Surgery

## 2013-01-16 DIAGNOSIS — K811 Chronic cholecystitis: Secondary | ICD-10-CM

## 2013-01-16 NOTE — Progress Notes (Signed)
The patient no call/showed to his appointment.  Does not appear that he obtained his labs.  Kortney Schoenfelder, ANP-BC

## 2013-01-30 ENCOUNTER — Encounter (INDEPENDENT_AMBULATORY_CARE_PROVIDER_SITE_OTHER): Payer: Self-pay

## 2013-01-30 ENCOUNTER — Ambulatory Visit (INDEPENDENT_AMBULATORY_CARE_PROVIDER_SITE_OTHER): Payer: Medicaid Other | Admitting: General Surgery

## 2013-01-30 VITALS — BP 128/78 | HR 80 | Temp 98.0°F | Resp 18 | Ht 69.0 in | Wt 167.0 lb

## 2013-01-30 DIAGNOSIS — R109 Unspecified abdominal pain: Secondary | ICD-10-CM

## 2013-01-30 DIAGNOSIS — R197 Diarrhea, unspecified: Secondary | ICD-10-CM

## 2013-01-30 DIAGNOSIS — Z09 Encounter for follow-up examination after completed treatment for conditions other than malignant neoplasm: Secondary | ICD-10-CM | POA: Insufficient documentation

## 2013-01-30 MED ORDER — CHOLESTYRAMINE 4 G PO PACK: 4.0000 g | PACK | Freq: Three times a day (TID) | ORAL | Status: DC

## 2013-01-30 MED ORDER — TRAMADOL HCL 50 MG PO TABS: 50.0000 mg | ORAL_TABLET | Freq: Two times a day (BID) | ORAL | Status: DC | PRN

## 2013-01-30 NOTE — Progress Notes (Signed)
The patient comes in and states that he still having pain. He also is complaining of diarrhea. He says he's gone 3-4 times a day. He is otherwise doing well. He was need to re\re order of his pain medically.  On examination his wounds are healed well with no evidence of infection. He's got normal active bowel sounds. There is a possibility that his diarrhea secondary to the absence of the gallbladder therefore we treated him with some Questran. For his pain I will order one set of Tramadol pills.He is to return to see me on an as-needed basis. I will check his liver function tests today. If they are abnormal then he may need further studies.

## 2013-01-30 NOTE — Addendum Note (Signed)
Addended by: Maryan Puls on: 01/30/2013 12:23 PM   Modules accepted: Orders

## 2013-01-31 ENCOUNTER — Telehealth (INDEPENDENT_AMBULATORY_CARE_PROVIDER_SITE_OTHER): Payer: Self-pay | Admitting: *Deleted

## 2013-01-31 NOTE — Telephone Encounter (Signed)
LMOM for pt to return my call.  I was calling to find out if he has a PCP in mind or a location of where he would like for me to search since his address states he lives in [place], [place].  Will wait for a return call.

## 2013-02-12 ENCOUNTER — Telehealth (INDEPENDENT_AMBULATORY_CARE_PROVIDER_SITE_OTHER): Payer: Self-pay | Admitting: *Deleted

## 2013-02-12 NOTE — Telephone Encounter (Signed)
LMOM for pt to return my call.  Needing his input on what area he would like me to schedule him for an appt with a PCP.

## 2013-08-28 ENCOUNTER — Encounter (HOSPITAL_COMMUNITY): Payer: Self-pay | Admitting: Emergency Medicine

## 2013-08-28 ENCOUNTER — Emergency Department (EMERGENCY_DEPARTMENT_HOSPITAL)
Admission: EM | Admit: 2013-08-28 | Discharge: 2013-08-29 | Disposition: A | Discharge status: Other Acute Inpatient Hospital | Payer: 59 | Source: Home / Self Care

## 2013-08-28 DIAGNOSIS — F1010 Alcohol abuse, uncomplicated: Secondary | ICD-10-CM | POA: Insufficient documentation

## 2013-08-28 DIAGNOSIS — F319 Bipolar disorder, unspecified: Secondary | ICD-10-CM | POA: Insufficient documentation

## 2013-08-28 DIAGNOSIS — R45851 Suicidal ideations: Secondary | ICD-10-CM

## 2013-08-28 DIAGNOSIS — F17200 Nicotine dependence, unspecified, uncomplicated: Secondary | ICD-10-CM | POA: Insufficient documentation

## 2013-08-28 DIAGNOSIS — Z79899 Other long term (current) drug therapy: Secondary | ICD-10-CM | POA: Insufficient documentation

## 2013-08-28 DIAGNOSIS — Z8673 Personal history of transient ischemic attack (TIA), and cerebral infarction without residual deficits: Secondary | ICD-10-CM | POA: Insufficient documentation

## 2013-08-28 DIAGNOSIS — F10920 Alcohol use, unspecified with intoxication, uncomplicated: Secondary | ICD-10-CM

## 2013-08-28 LAB — RAPID URINE DRUG SCREEN, HOSP PERFORMED
Amphetamines: NOT DETECTED
Barbiturates: NOT DETECTED
Benzodiazepines: NOT DETECTED
Cocaine: NOT DETECTED
Opiates: NOT DETECTED
Tetrahydrocannabinol: NOT DETECTED

## 2013-08-28 LAB — CBC
HCT: 42.4 % (ref 39.0–52.0)
HEMOGLOBIN: 14.9 g/dL (ref 13.0–17.0)
MCH: 32.7 pg (ref 26.0–34.0)
MCHC: 35.1 g/dL (ref 30.0–36.0)
MCV: 93 fL (ref 78.0–100.0)
Platelets: 214 10*3/uL (ref 150–400)
RBC: 4.56 MIL/uL (ref 4.22–5.81)
RDW: 12.2 % (ref 11.5–15.5)
WBC: 7 10*3/uL (ref 4.0–10.5)

## 2013-08-28 NOTE — ED Notes (Signed)
Bed: WLPT4 Expected date:  Expected time:  Means of arrival:  Comments: EMS-SI 

## 2013-08-28 NOTE — ED Notes (Signed)
Pt was just released from [name] in [place] on the 6th, he states that sitting around talking about his feelings with other people is making it worse. EMS picked him up from his parents house, told his family that he wanted to kill himself. Per EMS he quit talking and is not acting like he wants to pass out.

## 2013-08-28 NOTE — ED Provider Notes (Signed)
CSN: [id number]     Arrival date & time 08/28/13  2234 History  This chart was scribed for non-physician practitioner, Ivonne AndrewPeter Kruz Chiu, PA-C,working with Rolland PorterMark James, MD, by Karle PlumberJennifer Tensley, ED Scribe.  This patient was seen in room WTR4/WLPT4 and the patient's care was started at 11:50 PM.  Chief Complaint  Patient presents with  . Medical Clearance  . Suicidal   The history is provided by the patient. No language interpreter was used.   HPI Comments:  [name] is a 43 y.o. male with h/o CVA, bipolar disorder and anxiety, male brought in by EMS, who presents to the Emergency Department needing medical clearance. Pt states he has been experiencing worsening depression. He has been in treatment for alcohol abuse and was released yesterday after staying for 10 days. He reports drinking beer earlier today. He states he has drank "all his life" and has been in and out of treatment facilities for the majority of his life. Pt reports being on Klonopin but is now currently out. He reports that medication normally helps him. He reports SI. He states he tried to choke himself while here in triage. He denies fever, chills, sweats, nausea, vomiting, or diarrhea.   Past Medical History  Diagnosis Date  . Bipolar 1 disorder   . Anxiety   . Stroke    Past Surgical History  Procedure Laterality Date  . Finger surgery    . Cholecystectomy N/A 12/24/2012    Procedure: LAPAROSCOPIC CHOLECYSTECTOMY;  Surgeon: Cherylynn RidgesJames O Wyatt, MD;  [facility];  Service: General;  Laterality: N/A;   Family History  Problem Relation Age of Onset  . Heart disease Mother    History  Substance Use Topics  . Smoking status: Current Every Day Smoker -- 1.00 packs/day    Types: Cigarettes  . Smokeless tobacco: Not on file  . Alcohol Use: Yes     Comment: 3 24oz beers every other day    Review of Systems  Constitutional: Negative for fever and chills.  Gastrointestinal: Negative for nausea, vomiting, abdominal pain and  diarrhea.  Psychiatric/Behavioral: Positive for suicidal ideas.  All other systems reviewed and are negative.     Allergies  Review of patient's allergies indicates no known allergies.  Home Medications   Prior to Admission medications   Medication Sig Start Date End Date Taking? Authorizing Provider  paliperidone (INVEGA) 3 MG 24 hr tablet Take 3 mg by mouth daily.   Yes Historical Provider, MD  paliperidone (INVEGA) 6 MG 24 hr tablet Take 6 mg by mouth daily.    Historical Provider, MD   BP 114/68  Pulse 99  Temp(Src) 98.4 F (36.9 C) (Oral)  Resp 20  SpO2 93% Physical Exam  Nursing note and vitals reviewed. Constitutional: He is oriented to person, place, and time. He appears well-developed and well-nourished. No distress.  HENT:  Head: Normocephalic and atraumatic.  Mouth/Throat: Oropharynx is clear and moist.  Cardiovascular: Normal rate and regular rhythm.   Pulmonary/Chest: Effort normal and breath sounds normal. No respiratory distress. He has no wheezes.  Abdominal: Soft. There is no tenderness. There is no rebound and no guarding.  Neurological: He is alert and oriented to person, place, and time.  Skin: Skin is warm.  Psychiatric: He exhibits a depressed mood. He expresses suicidal ideation. He expresses no homicidal ideation. He expresses no suicidal plans.    ED Course  Procedures  DIAGNOSTIC STUDIES: Oxygen Saturation is 93% on RA, low by my interpretation.  COORDINATION OF CARE: 11:58 PM- Will order standard medical clearance labs. Pt verbalizes understanding and agrees to plan.  Patient has been medically cleared for further psychiatric and addiction evaluation.  Psychiatric holding orders in place. TTS consult placed    Results for orders placed during the hospital encounter of 08/28/13  ACETAMINOPHEN LEVEL      Result Value Ref Range   Acetaminophen (Tylenol), Serum <15.0  10 - 30 ug/mL  CBC      Result Value Ref Range   WBC 7.0  4.0 - 10.5  K/uL   RBC 4.56  4.22 - 5.81 MIL/uL   Hemoglobin 14.9  13.0 - 17.0 g/dL   HCT 16.142.4  09.639.0 - 04.552.0 %   MCV 93.0  78.0 - 100.0 fL   MCH 32.7  26.0 - 34.0 pg   MCHC 35.1  30.0 - 36.0 g/dL   RDW 40.912.2  81.111.5 - 91.415.5 %   Platelets 214  150 - 400 K/uL  COMPREHENSIVE METABOLIC PANEL      Result Value Ref Range   Sodium 140  137 - 147 mEq/L   Potassium 3.5 (*) 3.7 - 5.3 mEq/L   Chloride 99  96 - 112 mEq/L   CO2 20  19 - 32 mEq/L   Glucose, Bld 119 (*) 70 - 99 mg/dL   BUN 15  6 - 23 mg/dL   Creatinine, Ser 7.820.87  0.50 - 1.35 mg/dL   Calcium 9.2  8.4 - 95.610.5 mg/dL   Total Protein 7.4  6.0 - 8.3 g/dL   Albumin 3.7  3.5 - 5.2 g/dL   AST 52 (*) 0 - 37 U/L   ALT 152 (*) 0 - 53 U/L   Alkaline Phosphatase 109  39 - 117 U/L   Total Bilirubin 0.4  0.3 - 1.2 mg/dL   GFR calc non Af Amer >90  >90 mL/min   GFR calc Af Amer >90  >90 mL/min   Anion gap 21 (*) 5 - 15  ETHANOL      Result Value Ref Range   Alcohol, Ethyl (B) 68 (*) 0 - 11 mg/dL  SALICYLATE LEVEL      Result Value Ref Range   Salicylate Lvl <2.0 (*) 2.8 - 20.0 mg/dL  URINE RAPID DRUG SCREEN (HOSP PERFORMED)      Result Value Ref Range   Opiates NONE DETECTED  NONE DETECTED   Cocaine NONE DETECTED  NONE DETECTED   Benzodiazepines NONE DETECTED  NONE DETECTED   Amphetamines NONE DETECTED  NONE DETECTED   Tetrahydrocannabinol NONE DETECTED  NONE DETECTED   Barbiturates NONE DETECTED  NONE DETECTED    MDM   Final diagnoses:  Alcohol intoxication, uncomplicated  Suicidal ideation      I personally performed the services described in this documentation, which was scribed in my presence. The recorded information has been reviewed and is accurate.     Angus SellerPeter S Neftaly Inzunza, PA-C 08/29/13 [phone]

## 2013-08-29 ENCOUNTER — Encounter (HOSPITAL_COMMUNITY): Payer: Self-pay | Admitting: *Deleted

## 2013-08-29 ENCOUNTER — Inpatient Hospital Stay (HOSPITAL_COMMUNITY)
Admission: AD | Admit: 2013-08-29 | Discharge: 2013-09-05 | DRG: 885 | Disposition: A | Discharge status: Home / Self Care | Payer: 59 | Source: Intra-hospital | Attending: Psychiatry | Admitting: Psychiatry

## 2013-08-29 DIAGNOSIS — F10280 Alcohol dependence with alcohol-induced anxiety disorder: Secondary | ICD-10-CM

## 2013-08-29 DIAGNOSIS — R45851 Suicidal ideations: Secondary | ICD-10-CM

## 2013-08-29 DIAGNOSIS — Z5987 Material hardship due to limited financial resources, not elsewhere classified: Secondary | ICD-10-CM

## 2013-08-29 DIAGNOSIS — F10288 Alcohol dependence with other alcohol-induced disorder: Secondary | ICD-10-CM

## 2013-08-29 DIAGNOSIS — Z5900 Homelessness unspecified: Secondary | ICD-10-CM

## 2013-08-29 DIAGNOSIS — F250 Schizoaffective disorder, bipolar type: Secondary | ICD-10-CM | POA: Diagnosis present

## 2013-08-29 DIAGNOSIS — F17200 Nicotine dependence, unspecified, uncomplicated: Secondary | ICD-10-CM | POA: Diagnosis present

## 2013-08-29 DIAGNOSIS — G4700 Insomnia, unspecified: Secondary | ICD-10-CM | POA: Diagnosis present

## 2013-08-29 DIAGNOSIS — Z598 Other problems related to housing and economic circumstances: Secondary | ICD-10-CM

## 2013-08-29 DIAGNOSIS — F3160 Bipolar disorder, current episode mixed, unspecified: Principal | ICD-10-CM | POA: Diagnosis present

## 2013-08-29 DIAGNOSIS — Z8249 Family history of ischemic heart disease and other diseases of the circulatory system: Secondary | ICD-10-CM

## 2013-08-29 DIAGNOSIS — F319 Bipolar disorder, unspecified: Secondary | ICD-10-CM | POA: Diagnosis present

## 2013-08-29 DIAGNOSIS — F411 Generalized anxiety disorder: Secondary | ICD-10-CM | POA: Diagnosis present

## 2013-08-29 DIAGNOSIS — F1094 Alcohol use, unspecified with alcohol-induced mood disorder: Secondary | ICD-10-CM | POA: Diagnosis present

## 2013-08-29 DIAGNOSIS — Z8673 Personal history of transient ischemic attack (TIA), and cerebral infarction without residual deficits: Secondary | ICD-10-CM

## 2013-08-29 DIAGNOSIS — F1010 Alcohol abuse, uncomplicated: Secondary | ICD-10-CM

## 2013-08-29 DIAGNOSIS — F1020 Alcohol dependence, uncomplicated: Secondary | ICD-10-CM | POA: Diagnosis present

## 2013-08-29 LAB — COMPREHENSIVE METABOLIC PANEL
ALT: 152 U/L — ABNORMAL HIGH (ref 0–53)
AST: 52 U/L — ABNORMAL HIGH (ref 0–37)
Albumin: 3.7 g/dL (ref 3.5–5.2)
Alkaline Phosphatase: 109 U/L (ref 39–117)
Anion gap: 21 — ABNORMAL HIGH (ref 5–15)
BUN: 15 mg/dL (ref 6–23)
CALCIUM: 9.2 mg/dL (ref 8.4–10.5)
CO2: 20 meq/L (ref 19–32)
CREATININE: 0.87 mg/dL (ref 0.50–1.35)
Chloride: 99 mEq/L (ref 96–112)
GLUCOSE: 119 mg/dL — AB (ref 70–99)
Potassium: 3.5 mEq/L — ABNORMAL LOW (ref 3.7–5.3)
Sodium: 140 mEq/L (ref 137–147)
Total Bilirubin: 0.4 mg/dL (ref 0.3–1.2)
Total Protein: 7.4 g/dL (ref 6.0–8.3)

## 2013-08-29 LAB — ETHANOL: ALCOHOL ETHYL (B): 68 mg/dL — AB (ref 0–11)

## 2013-08-29 LAB — ACETAMINOPHEN LEVEL: Acetaminophen (Tylenol), Serum: <15 ug/mL (ref 10–30)

## 2013-08-29 LAB — SALICYLATE LEVEL: Salicylate Lvl: <2 mg/dL — ABNORMAL LOW (ref 2.8–20.0)

## 2013-08-29 MED ORDER — ONDANSETRON 4 MG PO TBDP: 4.0000 mg | ORAL_TABLET | Freq: Four times a day (QID) | ORAL | Status: AC | PRN

## 2013-08-29 MED ORDER — HALOPERIDOL LACTATE 5 MG/ML IJ SOLN: 5.0000 mg | Freq: Four times a day (QID) | INTRAMUSCULAR | Status: DC | PRN

## 2013-08-29 MED ORDER — LORAZEPAM 1 MG PO TABS
1.0000 mg | ORAL_TABLET | Freq: Four times a day (QID) | ORAL | Status: AC
  Administered 2013-08-29 – 2013-08-30 (×4): 1 mg via ORAL
  Filled 2013-08-29 (×6): qty 1

## 2013-08-29 MED ORDER — LOPERAMIDE HCL 2 MG PO CAPS: 2.0000 mg | ORAL_CAPSULE | ORAL | Status: AC | PRN

## 2013-08-29 MED ORDER — ACETAMINOPHEN 325 MG PO TABS: 650.0000 mg | ORAL_TABLET | Freq: Four times a day (QID) | ORAL | Status: DC | PRN

## 2013-08-29 MED ORDER — ZOLPIDEM TARTRATE 5 MG PO TABS: 5.0000 mg | ORAL_TABLET | Freq: Every evening | ORAL | Status: DC | PRN

## 2013-08-29 MED ORDER — POTASSIUM CHLORIDE CRYS ER 20 MEQ PO TBCR
40.0000 meq | EXTENDED_RELEASE_TABLET | Freq: Once | ORAL | Status: AC
  Administered 2013-08-29: 40 meq via ORAL
  Filled 2013-08-29: qty 2

## 2013-08-29 MED ORDER — HALOPERIDOL 5 MG PO TABS
5.0000 mg | ORAL_TABLET | Freq: Four times a day (QID) | ORAL | Status: DC | PRN
  Administered 2013-08-30 – 2013-09-04 (×3): 5 mg via ORAL
  Filled 2013-08-29 (×3): qty 1

## 2013-08-29 MED ORDER — ADULT MULTIVITAMIN W/MINERALS CH
1.0000 | ORAL_TABLET | Freq: Every day | ORAL | Status: DC
Start: 2013-08-30 — End: 2013-09-05
  Administered 2013-08-30 – 2013-09-05 (×6): 1 via ORAL
  Filled 2013-08-29 (×5): qty 1
  Filled 2013-08-29: qty 3
  Filled 2013-08-29 (×4): qty 1

## 2013-08-29 MED ORDER — NICOTINE 21 MG/24HR TD PT24: 21.0000 mg | MEDICATED_PATCH | Freq: Every day | TRANSDERMAL | Status: DC

## 2013-08-29 MED ORDER — LORAZEPAM 1 MG PO TABS
1.0000 mg | ORAL_TABLET | Freq: Three times a day (TID) | ORAL | Status: AC
  Administered 2013-08-31 – 2013-09-01 (×3): 1 mg via ORAL
  Filled 2013-08-29 (×2): qty 1

## 2013-08-29 MED ORDER — ONDANSETRON HCL 4 MG PO TABS: 4.0000 mg | ORAL_TABLET | Freq: Three times a day (TID) | ORAL | Status: DC | PRN

## 2013-08-29 MED ORDER — HYDROXYZINE HCL 25 MG PO TABS: 25.0000 mg | ORAL_TABLET | Freq: Four times a day (QID) | ORAL | Status: AC | PRN

## 2013-08-29 MED ORDER — LORAZEPAM 1 MG PO TABS
1.0000 mg | ORAL_TABLET | Freq: Every day | ORAL | Status: AC
  Administered 2013-09-03: 1 mg via ORAL
  Filled 2013-08-29: qty 1

## 2013-08-29 MED ORDER — ALUM & MAG HYDROXIDE-SIMETH 200-200-20 MG/5ML PO SUSP: 30.0000 mL | ORAL | Status: DC | PRN

## 2013-08-29 MED ORDER — TRAZODONE HCL 50 MG PO TABS
50.0000 mg | ORAL_TABLET | Freq: Every evening | ORAL | Status: DC | PRN
  Administered 2013-08-29: 50 mg via ORAL
  Filled 2013-08-29 (×7): qty 1

## 2013-08-29 MED ORDER — THIAMINE HCL 100 MG/ML IJ SOLN: 100.0000 mg | Freq: Once | INTRAMUSCULAR | Status: DC

## 2013-08-29 MED ORDER — LORAZEPAM 1 MG PO TABS
1.0000 mg | ORAL_TABLET | Freq: Two times a day (BID) | ORAL | Status: AC
  Administered 2013-09-01 – 2013-09-02 (×2): 1 mg via ORAL
  Filled 2013-08-29 (×2): qty 1

## 2013-08-29 MED ORDER — IBUPROFEN 200 MG PO TABS
600.0000 mg | ORAL_TABLET | Freq: Three times a day (TID) | ORAL | Status: DC | PRN
  Administered 2013-08-29: 600 mg via ORAL
  Filled 2013-08-29: qty 3

## 2013-08-29 MED ORDER — LORAZEPAM 1 MG PO TABS
1.0000 mg | ORAL_TABLET | Freq: Four times a day (QID) | ORAL | Status: AC | PRN
  Filled 2013-08-29 (×2): qty 1

## 2013-08-29 MED ORDER — MAGNESIUM HYDROXIDE 400 MG/5ML PO SUSP: 30.0000 mL | Freq: Every day | ORAL | Status: DC | PRN

## 2013-08-29 MED ORDER — VITAMIN B-1 100 MG PO TABS
100.0000 mg | ORAL_TABLET | Freq: Every day | ORAL | Status: DC
  Administered 2013-08-30: 100 mg via ORAL
  Filled 2013-08-29 (×4): qty 1

## 2013-08-29 MED ORDER — NAPROXEN 500 MG PO TABS: 500.0000 mg | ORAL_TABLET | Freq: Two times a day (BID) | ORAL | Status: DC | PRN

## 2013-08-29 NOTE — ED Notes (Addendum)
Pt transferred from triage via wheelchair, gait unsteady, presents with complaint of SI.  Pt reports he wrapped a cord around his neck in triage. Resp.even  & unlabored, no distress noted. No ligature marks noted.  Pt also reports he would drink himself to death.  Pt admits to drinking 2-4/40 oz beers per day.   Denies HI or AV hallucinations.  Feeling hopeless.  Pt is homeless.  Diagnosed with Bipolar DO in the past.  Pt reports he is dizzy because he ingested his Invega meds with alcohol.  Pt calm & cooperative at present.

## 2013-08-29 NOTE — ED Notes (Signed)
Patient transported to psych ed via wheelchair, gait very unsteady.  Charge Nurse Tiffany and RN Victorino DikeJennifer in main ed notified.  Psych RN requested that pt be moved to main ed, because of high fall risk.
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
## 2013-08-29 NOTE — ED Notes (Signed)
Pt eval by TTS Marcus. 

## 2013-08-29 NOTE — Consult Note (Signed)
  Psychiatric Specialty Exam: Physical Exam  ROS  Blood pressure 92/60, pulse 18, temperature 98 F (36.7 C), temperature source Oral, resp. rate 18, SpO2 93.00%.There is no weight on file to calculate BMI.  General Appearance: Disheveled  Eye Contact::  Minimal  Speech:  Clear and Coherent  Volume:  Normal  Mood:  Irritable  Affect:  irritable  Thought Process:  Coherent  Orientation:  Full (Time, Place, and Person)  Thought Content:  Negative  Suicidal Thoughts:  Yes.  without intent/plan  Homicidal Thoughts:  No  Memory:  Immediate;   Good Recent;   Good Remote;   Good  Judgement:  Fair  Insight:  Lacking  Psychomotor Activity:  Normal  Concentration:  Poor  Recall:  Good  Akathisia:  Negative  Handed:  Right  AIMS (if indicated):     Assets:  Communication Skills  Sleep:   adequate  Mr Montez MoritaCarter [facility] but has been drinking again and requesting detox again.  Says he does not have any way to pay for his medications or to get to therapy. He has been making some suicidal threats while under the influence.  [facility].

## 2013-08-30 DIAGNOSIS — F319 Bipolar disorder, unspecified: Secondary | ICD-10-CM | POA: Diagnosis present

## 2013-08-30 DIAGNOSIS — F250 Schizoaffective disorder, bipolar type: Secondary | ICD-10-CM | POA: Diagnosis present

## 2013-08-30 DIAGNOSIS — F39 Unspecified mood [affective] disorder: Secondary | ICD-10-CM

## 2013-08-30 DIAGNOSIS — F1910 Other psychoactive substance abuse, uncomplicated: Secondary | ICD-10-CM

## 2013-08-30 DIAGNOSIS — R45851 Suicidal ideations: Secondary | ICD-10-CM

## 2013-08-30 MED ORDER — ADULT MULTIVITAMIN W/MINERALS CH
1.0000 | ORAL_TABLET | Freq: Every day | ORAL | Status: DC
  Filled 2013-08-30 (×3): qty 1

## 2013-08-30 MED ORDER — NICOTINE 21 MG/24HR TD PT24: 21.0000 mg | MEDICATED_PATCH | Freq: Every day | TRANSDERMAL | Status: DC

## 2013-08-30 MED ORDER — PALIPERIDONE ER 3 MG PO TB24
3.0000 mg | ORAL_TABLET | Freq: Every day | ORAL | Status: DC
  Administered 2013-08-30 – 2013-09-05 (×6): 3 mg via ORAL
  Filled 2013-08-30: qty 1
  Filled 2013-08-30: qty 3
  Filled 2013-08-30 (×9): qty 1

## 2013-08-30 MED ORDER — ALUM & MAG HYDROXIDE-SIMETH 200-200-20 MG/5ML PO SUSP: 30.0000 mL | ORAL | Status: DC | PRN

## 2013-08-30 MED ORDER — FOLIC ACID 1 MG PO TABS
1.0000 mg | ORAL_TABLET | Freq: Every day | ORAL | Status: DC
Start: 2013-08-30 — End: 2013-09-05
  Administered 2013-08-30 – 2013-09-05 (×6): 1 mg via ORAL
  Filled 2013-08-30 (×10): qty 1

## 2013-08-30 MED ORDER — LORAZEPAM 1 MG PO TABS
1.0000 mg | ORAL_TABLET | Freq: Four times a day (QID) | ORAL | Status: AC | PRN
  Administered 2013-08-31 – 2013-09-01 (×3): 1 mg via ORAL
  Filled 2013-08-30: qty 1

## 2013-08-30 MED ORDER — TRAZODONE HCL 50 MG PO TABS
50.0000 mg | ORAL_TABLET | Freq: Every evening | ORAL | Status: DC | PRN
  Filled 2013-08-30: qty 1

## 2013-08-30 MED ORDER — THIAMINE HCL 100 MG/ML IJ SOLN: 100.0000 mg | Freq: Every day | INTRAMUSCULAR | Status: DC

## 2013-08-30 MED ORDER — NICOTINE 21 MG/24HR TD PT24
21.0000 mg | MEDICATED_PATCH | Freq: Every day | TRANSDERMAL | Status: DC
  Administered 2013-09-01: 21 mg via TRANSDERMAL
  Filled 2013-08-30 (×9): qty 1

## 2013-08-30 MED ORDER — VITAMIN B-1 100 MG PO TABS
100.0000 mg | ORAL_TABLET | Freq: Every day | ORAL | Status: DC
  Administered 2013-09-01 – 2013-09-05 (×5): 100 mg via ORAL
  Filled 2013-08-30 (×8): qty 1

## 2013-08-30 MED ORDER — LORAZEPAM 2 MG/ML IJ SOLN: 1.0000 mg | Freq: Four times a day (QID) | INTRAMUSCULAR | Status: AC | PRN

## 2013-08-30 MED ORDER — ACETAMINOPHEN 325 MG PO TABS
650.0000 mg | ORAL_TABLET | Freq: Four times a day (QID) | ORAL | Status: DC | PRN
Start: 2013-08-30 — End: 2013-09-05
  Administered 2013-08-31 – 2013-09-02 (×2): 650 mg via ORAL
  Filled 2013-08-30 (×2): qty 2

## 2013-08-30 MED ORDER — MAGNESIUM HYDROXIDE 400 MG/5ML PO SUSP: 30.0000 mL | Freq: Every day | ORAL | Status: DC | PRN

## 2013-08-30 NOTE — Tx Team (Signed)
Initial Interdisciplinary Treatment Plan  PATIENT STRENGTHS: (choose at least two) Average or above average intelligence General fund of knowledge  PATIENT STRESSORS: Substance abuse   PROBLEM LIST: Problem List/Patient Goals Date to be addressed Date deferred Reason deferred Estimated date of resolution  Suicide Risk 08/30/2013           Substance Abuse 08/30/2013                                          DISCHARGE CRITERIA:  Safe-care adequate arrangements made Withdrawal symptoms are absent or subacute and managed without 24-hour nursing intervention  PRELIMINARY DISCHARGE PLAN: Attend PHP/IOP Attend 12-step recovery group  PATIENT/FAMIILY INVOLVEMENT: This treatment plan has been presented to and reviewed with the patient, Rickey BarbaraRandy W Wernick.  The patient and family have been given the opportunity to ask questions and make suggestions.  Ned ClinesDopson, Reesa Gotschall E 08/30/2013, 4:09 PM

## 2013-08-30 NOTE — Plan of Care (Addendum)
[facility] Observation Crisis Plan  Reason for Crisis Plan:  Crisis Stabilization   Plan of Care:  Referral for Inpatient Hospitalization  Family Support:    Gweneth Dimitrilizabeth Grayson (mother) [phone]  Current Living Environment:   Pt lives with his mother and brother intermittently, otherwise he is homeless.  Insurance:  [facility]   Name Acct ID Class Status Primary Coverage   Rickey BarbaraCarter, Wen W [id number] BEHAVIORAL HEALTH OBSERVATION Open MEDICAID [place] MEDICAID OF [place]        Guarantor Account (for Hospital Account [id number])   Name Relation to Pt Service Area Active? Acct Type   Rickey Barbaraarter, Melanie W Self [facility] Yes Behavioral Health   Address Phone       [address] [place], [place] [id number] [phone])          Coverage Information (for Hospital Account [id number])   F/O Payor/Plan Precert #   MEDICAID Kevin/MEDICAID OF [place]    Subscriber Subscriber #   Rickey BarbaraCarter, Georgi W [id number]   Address Phone   [address] [address] [place], [place] [address] [phone]      Legal Guardian:   Self  Primary Care Provider:  No PCP Per Patient  Current Outpatient Providers:  None; Pt was [facility] [place] on or about 08/27/2013.  He was to follow up with an unspecified provider but has not done so.  He did not consent for release of information to any providers.  Psychiatrist:    None  Counselor/Therapist:   None  Compliant with Medications:  No  Additional Information: After consulting with Delorise Royalsorey Burkett, NP it has been determined that pt presents a life threatening danger to himself, for which psychiatric hospitalization is indicated.  [facility] to the service of Thedore MinsMojeed Akintayo, MD, Rm 406-2.  Pt initially refused to sign Voluntary Admission and Consent for treatment.  Geoffery LyonsIrving Lugo, MD then spoke to pt to determine whether he meets criteria for IVC.  However, pt then volunteered for treatment, signing the consent form.  He also signed  Consent for Release of Information to his mother only.  Doylene Canninghomas Analiz Tvedt, MA Triage Specialist Raphael GibneyHughes, Brunetta Newingham Patrick 7/9/20152:13 PM

## 2013-08-30 NOTE — Progress Notes (Signed)
Patient ID: [name], male   DOB: 06-23-1970, 43 y.o.   MRN: [id number] Patient transferred to Adult 400.  Escorted by Foy Guadalajarahrista, RN.

## 2013-08-30 NOTE — Progress Notes (Signed)
Patient ID: [name], male   DOB: 07/26/1970, 43 y.o.   MRN: [id number] D: Patient irritable in room. Pt refused evening lab draw. Pt refused evening medications. Pt stated he will do it tomorrow. Pt isolative in room without any interaction with peers. Cooperative with assessment. No acute distressed noted at this time.   A: Met with pt 1:1. Writer encouraged pt to discuss feelings. Pt encouraged to come to staff with any questions or concerns.   R: Patient is safe on the unit. Continue current POC.

## 2013-08-30 NOTE — Progress Notes (Signed)
[facility] MD Progress Note  08/30/2013 1:49 PM [name]  MRN:  [id number] Subjective:  43 year old male presented to [facility] with SI with a plan to shoot himself.  He was discharged from [facility] on 08/27/2013 where he was admitted for alcohol detox and reports that he started drinking again on Monday following discharge.  He is very soft-spoken during assessment, difficult for writer to hear and understand as his speech is garbled.  He reports that he continues to have SI with a plan to shoot himself or that he he would "do it the best way that he could do it".  He is unable to report precipitating factors that lead to him developing SI however he states, "A lot is happening".  Patient is bizarre, slow to respond to questions asked  By writer, and appears to be responding to internal stimuli.  Writer informs patient during assessment that medication adjustment/management needed and patient agrees.  Will admit inpatient.              Diagnosis:  Mood disorder NOS; Substance abuse; bipolar disorder  Total Time spent with patient: 15 minutes  Axis I: Mood Disorder NOS and substance abuse disorder Axis II: Deferred Axis IV: economic problems, housing problems, occupational problems, other psychosocial or environmental problems and problems with primary support group Axis V: 11-20 some danger of hurting self or others possible OR occasionally fails to maintain minimal personal hygiene OR gross impairment in communication  ADL's:  Intact  Sleep: Fair  Appetite:  Fair  Suicidal Ideation:  Plan:  to shoot self Homicidal Ideation:  Plan:  to shoot self with a gun AEB (as evidenced by):  Psychiatric Specialty Exam: Physical Exam  ROS  Blood pressure 103/73, pulse 79, temperature 98 F (36.7 C), temperature source Oral, resp. rate 16, height 5' 9" (1.753 m), weight 74.39 kg (164 lb), SpO2 99.00%.Body mass index is 24.21 kg/(m^2).  General Appearance: Disheveled  Eye Sport and exercise psychologist::  Fair  Speech:   Pressured and Slow  Volume:  Decreased  Mood:  Anxious  Affect:  Depressed and Flat  Thought Process:  Disorganized  Orientation:  Full (Time, Place, and Person)  Thought Content:  Negative  Suicidal Thoughts:  Yes.  with intent/plan  Homicidal Thoughts:  No  Memory:  Immediate;   Fair  Judgement:  Poor  Insight:  Lacking  Psychomotor Activity:  Decreased  Concentration:  Poor  Recall:  [employer] of Knowledge:Poor  Language: Negative  Akathisia:  Negative  Handed:    AIMS (if indicated):     Assets:  Communication Skills  Sleep:      Musculoskeletal: Strength & Muscle Tone: within normal limits Gait & Station: normal Patient leans: N/A  Current Medications: Current Facility-Administered Medications  Medication Dose Route Frequency Provider Last Rate Last Dose  . alum & mag hydroxide-simeth (MAALOX/MYLANTA) 200-200-20 MG/5ML suspension 30 mL  30 mL Oral Q4H PRN Laverle Hobby, PA-C      . haloperidol (HALDOL) tablet 5 mg  5 mg Oral Q6H PRN Laverle Hobby, PA-C   5 mg at 08/30/13 1135   Or  . haloperidol lactate (HALDOL) injection 5 mg  5 mg Intramuscular Q6H PRN Laverle Hobby, PA-C      . hydrOXYzine (ATARAX/VISTARIL) tablet 25 mg  25 mg Oral Q6H PRN Laverle Hobby, PA-C      . loperamide (IMODIUM) capsule 2-4 mg  2-4 mg Oral PRN Laverle Hobby, PA-C      .  LORazepam (ATIVAN) tablet 1 mg  1 mg Oral Q6H PRN Laverle Hobby, PA-C      . LORazepam (ATIVAN) tablet 1 mg  1 mg Oral QID Laverle Hobby, PA-C   1 mg at 08/30/13 1148   Followed by  . [START ON 08/31/2013] LORazepam (ATIVAN) tablet 1 mg  1 mg Oral TID Laverle Hobby, PA-C       Followed by  . [START ON 09/01/2013] LORazepam (ATIVAN) tablet 1 mg  1 mg Oral BID Laverle Hobby, PA-C       Followed by  . [START ON 09/03/2013] LORazepam (ATIVAN) tablet 1 mg  1 mg Oral Daily Spencer E Simon, PA-C      . magnesium hydroxide (MILK OF MAGNESIA) suspension 30 mL  30 mL Oral Daily PRN Laverle Hobby, PA-C      .  multivitamin with minerals tablet 1 tablet  1 tablet Oral Daily Laverle Hobby, PA-C   1 tablet at 08/30/13 0846  . naproxen (NAPROSYN) tablet 500 mg  500 mg Oral BID PRN Laverle Hobby, PA-C      . ondansetron (ZOFRAN-ODT) disintegrating tablet 4 mg  4 mg Oral Q6H PRN Laverle Hobby, PA-C      . paliperidone (INVEGA) 24 hr tablet 3 mg  3 mg Oral Daily  Glenda Chroman, NP   3 mg at 08/30/13 1215  . thiamine (B-1) injection 100 mg  100 mg Intramuscular Once [employer], PA-C      . thiamine (VITAMIN B-1) tablet 100 mg  100 mg Oral Daily Laverle Hobby, PA-C   100 mg at 08/30/13 0846  . traZODone (DESYREL) tablet 50 mg  50 mg Oral QHS,MR X 1 Laverle Hobby, PA-C   50 mg at 08/29/13 2118    Lab Results:  Results for orders placed during the hospital encounter of 08/28/13 (from the past 48 hour(s))  URINE RAPID DRUG SCREEN (HOSP PERFORMED)     Status: None   Collection Time    08/28/13 11:22 PM      Result Value Ref Range   Opiates NONE DETECTED  NONE DETECTED   Cocaine NONE DETECTED  NONE DETECTED   Benzodiazepines NONE DETECTED  NONE DETECTED   Amphetamines NONE DETECTED  NONE DETECTED   Tetrahydrocannabinol NONE DETECTED  NONE DETECTED   Barbiturates NONE DETECTED  NONE DETECTED   Comment:            DRUG SCREEN FOR MEDICAL PURPOSES     ONLY.  IF CONFIRMATION IS NEEDED     FOR ANY PURPOSE, NOTIFY LAB     WITHIN 5 DAYS.                LOWEST DETECTABLE LIMITS     FOR URINE DRUG SCREEN     Drug Class       Cutoff (ng/mL)     Amphetamine      1000     Barbiturate      200     Benzodiazepine   127     Tricyclics       517     Opiates          300     Cocaine          300     THC              50  ACETAMINOPHEN LEVEL     Status: None   Collection Time  08/28/13 11:25 PM      Result Value Ref Range   Acetaminophen (Tylenol), Serum <15.0  10 - 30 ug/mL   Comment:            THERAPEUTIC CONCENTRATIONS VARY     SIGNIFICANTLY. A RANGE OF 10-30     ug/mL MAY BE AN  EFFECTIVE     CONCENTRATION FOR MANY PATIENTS.     HOWEVER, SOME ARE BEST TREATED     AT CONCENTRATIONS OUTSIDE THIS     RANGE.     ACETAMINOPHEN CONCENTRATIONS     >150 ug/mL AT 4 HOURS AFTER     INGESTION AND >50 ug/mL AT 12     HOURS AFTER INGESTION ARE     OFTEN ASSOCIATED WITH TOXIC     REACTIONS.  CBC     Status: None   Collection Time    08/28/13 11:25 PM      Result Value Ref Range   WBC 7.0  4.0 - 10.5 K/uL   RBC 4.56  4.22 - 5.81 MIL/uL   Hemoglobin 14.9  13.0 - 17.0 g/dL   HCT 42.4  39.0 - 52.0 %   MCV 93.0  78.0 - 100.0 fL   MCH 32.7  26.0 - 34.0 pg   MCHC 35.1  30.0 - 36.0 g/dL   RDW 12.2  11.5 - 15.5 %   Platelets 214  150 - 400 K/uL  COMPREHENSIVE METABOLIC PANEL     Status: Abnormal   Collection Time    08/28/13 11:25 PM      Result Value Ref Range   Sodium 140  137 - 147 mEq/L   Potassium 3.5 (*) 3.7 - 5.3 mEq/L   Chloride 99  96 - 112 mEq/L   CO2 20  19 - 32 mEq/L   Glucose, Bld 119 (*) 70 - 99 mg/dL   BUN 15  6 - 23 mg/dL   Creatinine, Ser 0.87  0.50 - 1.35 mg/dL   Calcium 9.2  8.4 - 10.5 mg/dL   Total Protein 7.4  6.0 - 8.3 g/dL   Albumin 3.7  3.5 - 5.2 g/dL   AST 52 (*) 0 - 37 U/L   ALT 152 (*) 0 - 53 U/L   Alkaline Phosphatase 109  39 - 117 U/L   Total Bilirubin 0.4  0.3 - 1.2 mg/dL   GFR calc non Af Amer >90  >90 mL/min   GFR calc Af Amer >90  >90 mL/min   Comment: (NOTE)     The eGFR has been calculated using the CKD EPI equation.     This calculation has not been validated in all clinical situations.     eGFR's persistently <90 mL/min signify possible Chronic Kidney     Disease.   Anion gap 21 (*) 5 - 15   Comment: REPEATED TO VERIFY  ETHANOL     Status: Abnormal   Collection Time    08/28/13 11:25 PM      Result Value Ref Range   Alcohol, Ethyl (B) 68 (*) 0 - 11 mg/dL   Comment:            LOWEST DETECTABLE LIMIT FOR     SERUM ALCOHOL IS 11 mg/dL     FOR MEDICAL PURPOSES ONLY  SALICYLATE LEVEL     Status: Abnormal   Collection  Time    08/28/13 11:25 PM      Result Value Ref Range   Salicylate Lvl <7.5 (*) 2.8 - 20.0  mg/dL    Physical Findings: AIMS:  , ,  ,  ,    CIWA:  CIWA-Ar Total: 4 COWS:     Treatment Plan Summary: Daily contact with patient to assess and evaluate symptoms and progress in treatment Medication management  Plan: 1.) Admit patient to 400 hall bed. 2.) Crisis management and stabilization 3.) Medication management 4.) Encourage to attend groups and participate in group counseling sessions and activities 6.) Address health issues: Vitals reviewed and stable; repeat CMP 08/30/2013 [facility] PM draw.  I certify that inpatient services furnished can reasonably be expected to improve the patient's condition.   Gypsy Lore CORI 08/30/2013, 1:49 PM

## 2013-08-30 NOTE — Progress Notes (Signed)
Patient ID: [name], male   DOB: 12/19/1970, 43 y.o.   MRN: [id number]  [facility] Patient was admitted to the Adult unit voluntarily after being Observation unit. Patient was explained the admission process and he verbalized understanding. Patient is guarded and soft spoken but cooperative. Patient was oriented to the unit and bed was adjusted to patient's preference. Scheduled medications were administered to patient per physician's orders for evening medication. Q15 minute safety checks were initiated and are maintained. Patient is safe at this time.

## 2013-08-31 DIAGNOSIS — F1020 Alcohol dependence, uncomplicated: Secondary | ICD-10-CM

## 2013-08-31 DIAGNOSIS — F411 Generalized anxiety disorder: Secondary | ICD-10-CM | POA: Diagnosis present

## 2013-08-31 DIAGNOSIS — F3160 Bipolar disorder, current episode mixed, unspecified: Principal | ICD-10-CM

## 2013-08-31 MED ORDER — FLUOXETINE HCL 10 MG PO CAPS
10.0000 mg | ORAL_CAPSULE | Freq: Every day | ORAL | Status: DC
  Administered 2013-08-31 – 2013-09-02 (×3): 10 mg via ORAL
  Filled 2013-08-31 (×6): qty 1

## 2013-08-31 MED ORDER — GABAPENTIN 100 MG PO CAPS
200.0000 mg | ORAL_CAPSULE | Freq: Three times a day (TID) | ORAL | Status: DC
  Administered 2013-08-31 – 2013-09-05 (×16): 200 mg via ORAL
  Filled 2013-08-31 (×3): qty 2
  Filled 2013-08-31: qty 18
  Filled 2013-08-31 (×4): qty 2
  Filled 2013-08-31: qty 18
  Filled 2013-08-31 (×3): qty 2
  Filled 2013-08-31: qty 18
  Filled 2013-08-31 (×10): qty 2

## 2013-08-31 MED ORDER — TRAZODONE HCL 100 MG PO TABS
100.0000 mg | ORAL_TABLET | Freq: Every day | ORAL | Status: DC
Start: 2013-08-31 — End: 2013-09-05
  Administered 2013-08-31 – 2013-09-04 (×5): 100 mg via ORAL
  Filled 2013-08-31 (×3): qty 1
  Filled 2013-08-31: qty 3
  Filled 2013-08-31 (×4): qty 1

## 2013-08-31 NOTE — [facility] Group Notes (Signed)
[facility] LCSW Group Therapy  08/31/2013  1:05 PM  Type of Therapy:  Group therapy  Participation Level:  Active  Participation Quality:  Attentive  Affect:  Flat  Cognitive:  Oriented  Insight:  Limited  Engagement in Therapy:  Limited  Modes of Intervention:  Discussion, Socialization  Summary of Progress/Problems:  Chaplain was here to lead a group on themes of hope and courage.  [name] sat down in group initially.  He left soon after it started, and id not return.  [name], [name] B 08/31/2013 2:05 PM

## 2013-08-31 NOTE — H&P (Signed)
Psychiatric Admission Assessment Adult  Patient Identification:  [name] Date of Evaluation:  08/31/2013 Chief Complaint:  "I drank because I was upset about some family issues" History of Present Illness::  [name] is a 43 year old male who was brought to [facility] voluntarily via EMS after telling his family that he wanted to kill himself. The patient was just released from [facility] in [place], [place] on 07/28/13 after receiving treatment for alcohol abuse. The patient relapsed right after being discharged. He was drinking heavily before being admitted again to the hospital. [name] was having thoughts of shooting himself but did not have access. The patient is currently homeless stay with mother at different intervals. Patient states today during his psychiatric assessment "My mother just freaked out. I just got out of the hospital. I drank about family issues. I was emotional about the past. But I don't really want to discuss that. I get Disability for Bipolar. I have been on that since 2005." [name] appears very disheveled during the assessment. He was very irritable this morning with nursing staff as he refused all his medications. The patient later inquired how long he would need to stay in this facility. [name] did not appear to be fully invested in his treatment.   Elements:  Location:  Adult unit for depression, alcohol abuse . Quality:  Relapsed after beign released from [facility]. Severity:  Severe . Timing:  Last few days. Duration:  Years . Context:  Suicidal thoughts, family conflict, alcohol abuse. Associated Signs/Synptoms: Depression Symptoms:  depressed mood, anhedonia, psychomotor retardation, fatigue, feelings of worthlessness/guilt, hopelessness, impaired memory, recurrent thoughts of death, suicidal thoughts with specific plan, anxiety, loss of energy/fatigue, disturbed sleep, (Hypo) Manic Symptoms:  Impulsivity, Irritable Mood, Labiality of  Mood, Anxiety Symptoms:  Excessive Worry, Psychotic Symptoms:  Denies PTSD Symptoms: The patient hints at a traumatic exposure but will not elaborate during his assessment.  Total Time spent with patient: 1 hour  Psychiatric Specialty Exam: Physical Exam  Constitutional:  Physical exam findings reviewed from the [facility] and I concur with no noted exceptions.     Review of Systems  Constitutional: Positive for malaise/fatigue and diaphoresis.  HENT: Negative.   Eyes: Negative.   Respiratory: Negative.   Cardiovascular: Negative.   Gastrointestinal: Positive for nausea and diarrhea.  Genitourinary: Negative.   Musculoskeletal: Negative.   Skin: Negative.   Neurological: Positive for tremors.  Endo/Heme/Allergies: Negative.   Psychiatric/Behavioral: Positive for depression, suicidal ideas and substance abuse. The patient is nervous/anxious and has insomnia.     Blood pressure 114/76, pulse 115, temperature 98.9 F (37.2 C), temperature source Oral, resp. rate 16, height '5\' 9"'  (1.753 m), weight 74.39 kg (164 lb), SpO2 99.00%.Body mass index is 24.21 kg/(m^2).  General Appearance: Disheveled  Eye Contact::  Minimal  Speech:  Garbled  Volume:  Decreased  Mood:  Anxious and Depressed  Affect:  Constricted  Thought Process:  Goal Directed  Orientation:  Full (Time, Place, and Person)  Thought Content:  Negative  Suicidal Thoughts:  No  Homicidal Thoughts:  No  Memory:  Immediate;   Fair Recent;   Fair Remote;   Fair  Judgement:  Impaired  Insight:  Lacking  Psychomotor Activity:  Decreased  Concentration:  Fair  Recall:  [place]  Language: Fair  Akathisia:  No  Handed:  Right  AIMS (if indicated):     Assets:  Communication Skills Desire for Improvement Physical Health  Sleep:  Number of Hours: 6.75   Musculoskeletal: Strength & Muscle Tone: abnormal Gait & Station: unsteady Patient leans: Front  Past Psychiatric History:Yes Diagnosis: Bipolar  1  Hospitalizations:[facility], [facility]  Substance Abuse Care:For alcohol abuse  Self-Mutilation: Denies  Suicidal Attempts: Overdose in the past   Violent Behaviors: Denies   Past Medical History:   Past Medical History  Diagnosis Date  . Bipolar 1 disorder   . Anxiety   . Stroke    None. Allergies:  No Known Allergies PTA Medications: Prescriptions prior to admission  Medication Sig Dispense Refill  . paliperidone (INVEGA) 3 MG 24 hr tablet Take 3 mg by mouth daily.      . paliperidone (INVEGA) 6 MG 24 hr tablet Take 6 mg by mouth daily.        Previous Psychotropic Medications:  Medication/Dose  Invega   Klonopin             Substance Abuse History in the last 12 months:  Yes.   Patient recently relapsed on alcohol.   Consequences of Substance Abuse: Withdrawal symptoms from alcohol abuse   Social History:  reports that he has been smoking Cigarettes.  He has a 5 pack-year smoking history. He does not have any smokeless tobacco history on file. He reports that he drinks about 12 ounces of alcohol per week. He reports that he does not use illicit drugs. Additional Social History:                      Current Place of Residence:   Place of Birth:   Family Members: Parents Marital Status:  Divorced Children:0  Sons:  Daughters: Relationships: Education:  "I went through the 10th grade."  Educational Problems/Performance: Religious Beliefs/Practices: History of Abuse (Emotional/Phsycial/Sexual) Occupational Experiences; Has done flooring Military History:  None. Legal History: History of DUI and lost license as a result of substance abuse Hobbies/Interests:  Family History:   Family History  Problem Relation Age of Onset  . Heart disease Mother     Results for orders placed during the hospital encounter of 08/28/13 (from the past 72 hour(s))  URINE RAPID DRUG SCREEN (HOSP PERFORMED)     Status: None   Collection  Time    08/28/13 11:22 PM      Result Value Ref Range   Opiates NONE DETECTED  NONE DETECTED   Cocaine NONE DETECTED  NONE DETECTED   Benzodiazepines NONE DETECTED  NONE DETECTED   Amphetamines NONE DETECTED  NONE DETECTED   Tetrahydrocannabinol NONE DETECTED  NONE DETECTED   Barbiturates NONE DETECTED  NONE DETECTED   Comment:            DRUG SCREEN FOR MEDICAL PURPOSES     ONLY.  IF CONFIRMATION IS NEEDED     FOR ANY PURPOSE, NOTIFY LAB     WITHIN 5 DAYS.                LOWEST DETECTABLE LIMITS     FOR URINE DRUG SCREEN     Drug Class       Cutoff (ng/mL)     Amphetamine      1000     Barbiturate      200     Benzodiazepine   786     Tricyclics       754     Opiates          300     Cocaine  300     THC              50  ACETAMINOPHEN LEVEL     Status: None   Collection Time    08/28/13 11:25 PM      Result Value Ref Range   Acetaminophen (Tylenol), Serum <15.0  10 - 30 ug/mL   Comment:            THERAPEUTIC CONCENTRATIONS VARY     SIGNIFICANTLY. A RANGE OF 10-30     ug/mL MAY BE AN EFFECTIVE     CONCENTRATION FOR MANY PATIENTS.     HOWEVER, SOME ARE BEST TREATED     AT CONCENTRATIONS OUTSIDE THIS     RANGE.     ACETAMINOPHEN CONCENTRATIONS     >150 ug/mL AT 4 HOURS AFTER     INGESTION AND >50 ug/mL AT 12     HOURS AFTER INGESTION ARE     OFTEN ASSOCIATED WITH TOXIC     REACTIONS.  CBC     Status: None   Collection Time    08/28/13 11:25 PM      Result Value Ref Range   WBC 7.0  4.0 - 10.5 K/uL   RBC 4.56  4.22 - 5.81 MIL/uL   Hemoglobin 14.9  13.0 - 17.0 g/dL   HCT 42.4  39.0 - 52.0 %   MCV 93.0  78.0 - 100.0 fL   MCH 32.7  26.0 - 34.0 pg   MCHC 35.1  30.0 - 36.0 g/dL   RDW 12.2  11.5 - 15.5 %   Platelets 214  150 - 400 K/uL  COMPREHENSIVE METABOLIC PANEL     Status: Abnormal   Collection Time    08/28/13 11:25 PM      Result Value Ref Range   Sodium 140  137 - 147 mEq/L   Potassium 3.5 (*) 3.7 - 5.3 mEq/L   Chloride 99  96 - 112 mEq/L   CO2  20  19 - 32 mEq/L   Glucose, Bld 119 (*) 70 - 99 mg/dL   BUN 15  6 - 23 mg/dL   Creatinine, Ser 0.87  0.50 - 1.35 mg/dL   Calcium 9.2  8.4 - 10.5 mg/dL   Total Protein 7.4  6.0 - 8.3 g/dL   Albumin 3.7  3.5 - 5.2 g/dL   AST 52 (*) 0 - 37 U/L   ALT 152 (*) 0 - 53 U/L   Alkaline Phosphatase 109  39 - 117 U/L   Total Bilirubin 0.4  0.3 - 1.2 mg/dL   GFR calc non Af Amer >90  >90 mL/min   GFR calc Af Amer >90  >90 mL/min   Comment: (NOTE)     The eGFR has been calculated using the CKD EPI equation.     This calculation has not been validated in all clinical situations.     eGFR's persistently <90 mL/min signify possible Chronic Kidney     Disease.   Anion gap 21 (*) 5 - 15   Comment: REPEATED TO VERIFY  ETHANOL     Status: Abnormal   Collection Time    08/28/13 11:25 PM      Result Value Ref Range   Alcohol, Ethyl (B) 68 (*) 0 - 11 mg/dL   Comment:            LOWEST DETECTABLE LIMIT FOR     SERUM ALCOHOL IS 11 mg/dL     FOR MEDICAL PURPOSES ONLY  SALICYLATE  LEVEL     Status: Abnormal   Collection Time    08/28/13 11:25 PM      Result Value Ref Range   Salicylate Lvl <7.3 (*) 2.8 - 20.0 mg/dL   Psychological Evaluations:  Assessment:   DSM5:  AXIS I:  Bipolar 1 disorder, most recent episode mixed              Anxiety state, unspecified              Alcohol dependence AXIS II:  Deferred AXIS III:   Past Medical History  Diagnosis Date  . Bipolar 1 disorder   . Anxiety   . Stroke    AXIS IV:  economic problems, educational problems, housing problems, other psychosocial or environmental problems and problems with primary support group AXIS V:  41-50 serious symptoms  Treatment Plan/Recommendations:   1. Admit for crisis management and stabilization. Estimated length of stay 5-7 days. 2. Medication management to reduce current symptoms to base line and improve the patient's level of functioning.  3. Develop treatment plan to decrease risk of relapse upon discharge of  depressive symptoms and the need for readmission. 5. Group therapy to facilitate development of healthy coping skills to use for depression and anxiety. 6. Health care follow up as needed for medical problems.  7. Discharge plan to include therapy to help patient cope with family stressors.  8. Call for Consult with Hospitalist for additional specialty patient services as needed.   Treatment Plan Summary: Daily contact with patient to assess and evaluate symptoms and progress in treatment Medication management Current Medications:  Current Facility-Administered Medications  Medication Dose Route Frequency Provider Last Rate Last Dose  . acetaminophen (TYLENOL) tablet 650 mg  650 mg Oral Q6H PRN Evanna Glenda Chroman, NP      . alum & mag hydroxide-simeth (MAALOX/MYLANTA) 200-200-20 MG/5ML suspension 30 mL  30 mL Oral Q4H PRN Evanna Glenda Chroman, NP      . FLUoxetine (PROZAC) capsule 10 mg  10 mg Oral Daily Fatiha Guzy   10 mg at 08/31/13 1120  . folic acid (FOLVITE) tablet 1 mg  1 mg Oral Daily Evanna Glenda Chroman, NP   1 mg at 08/30/13 1643  . gabapentin (NEURONTIN) capsule 200 mg  200 mg Oral TID Allysa Governale   200 mg at 08/31/13 1120  . haloperidol (HALDOL) tablet 5 mg  5 mg Oral Q6H PRN Laverle Hobby, PA-C   5 mg at 08/30/13 1135   Or  . haloperidol lactate (HALDOL) injection 5 mg  5 mg Intramuscular Q6H PRN Laverle Hobby, PA-C      . hydrOXYzine (ATARAX/VISTARIL) tablet 25 mg  25 mg Oral Q6H PRN Laverle Hobby, PA-C      . loperamide (IMODIUM) capsule 2-4 mg  2-4 mg Oral PRN Laverle Hobby, PA-C      . LORazepam (ATIVAN) tablet 1 mg  1 mg Oral Q6H PRN Evanna Glenda Chroman, NP       Or  . LORazepam (ATIVAN) injection 1 mg  1 mg Intravenous Q6H PRN Evanna Glenda Chroman, NP      . LORazepam (ATIVAN) tablet 1 mg  1 mg Oral Q6H PRN Laverle Hobby, PA-C      . LORazepam (ATIVAN) tablet 1 mg  1 mg Oral TID Laverle Hobby, PA-C   1 mg at 08/31/13 1120   Followed by  . [START ON  09/01/2013] LORazepam (ATIVAN) tablet 1 mg  1 mg Oral  BID Laverle Hobby, PA-C       Followed by  . [START ON 09/03/2013] LORazepam (ATIVAN) tablet 1 mg  1 mg Oral Daily Spencer E Simon, PA-C      . magnesium hydroxide (MILK OF MAGNESIA) suspension 30 mL  30 mL Oral Daily PRN Evanna Glenda Chroman, NP      . multivitamin with minerals tablet 1 tablet  1 tablet Oral Daily Laverle Hobby, PA-C   1 tablet at 08/30/13 0846  . naproxen (NAPROSYN) tablet 500 mg  500 mg Oral BID PRN Laverle Hobby, PA-C      . nicotine (NICODERM CQ - dosed in mg/24 hours) patch 21 mg  21 mg Transdermal Daily Jesika Men      . ondansetron (ZOFRAN-ODT) disintegrating tablet 4 mg  4 mg Oral Q6H PRN Laverle Hobby, PA-C      . paliperidone (INVEGA) 24 hr tablet 3 mg  3 mg Oral Daily Evanna Glenda Chroman, NP   3 mg at 08/30/13 1215  . thiamine (B-1) injection 100 mg  100 mg Intramuscular Once [employer], PA-C      . thiamine (VITAMIN B-1) tablet 100 mg  100 mg Oral Daily Evanna Glenda Chroman, NP       Or  . thiamine (B-1) injection 100 mg  100 mg Intravenous Daily Evanna Glenda Chroman, NP      . traZODone (DESYREL) tablet 100 mg  100 mg Oral QHS Liliah Dorian        Observation Level/Precautions:  15 minute checks  Laboratory:  CBC Chemistry Profile UDS  Psychotherapy:  Individual and Group Therapy   Medications:  Ativan taper for alcohol detox, Invega 3 mg daily for psychosis, Prozac 10 mg daily for depression, Neurontin 200 mg TID for improved mood stability/anxiety  Consultations:  As needed  Discharge Concerns:  Continued substance abuse   Estimated LOS: 5-7 days   Other:  Increase collateral information from family    I certify that inpatient services furnished can reasonably be expected to improve the patient's condition.   Elmarie Shiley NP-C 7/10/201512:01 PM   Patient seen, evaluated and I agree with notes by Nurse Practitioner. Corena Pilgrim, MD

## 2013-08-31 NOTE — Progress Notes (Signed)
NUTRITION ASSESSMENT  Pt identified as at risk on the Malnutrition Screen Tool  INTERVENTION: 1. Educated patient on the importance of nutrition and encouraged intake of food and beverages. 2. Discussed weight goals. 3. Supplements: MVI and thiamine daily.    NUTRITION DIAGNOSIS: Unintentional weight loss related to sub-optimal intake as evidenced by pt report.   Goal: Pt to meet >/= 90% of their estimated nutrition needs.  Monitor:  PO intake  Assessment:  Patient admitted with SI, etoh abuse, BP, Mood disorder NOS.   States that appetite and intake are fair. UBW 170 lbs with 6 lb weight loss.  43 y.o. male  Height: Ht Readings from Last 1 Encounters:  08/29/13 5\' 9"  (1.753 m)    Weight: Wt Readings from Last 1 Encounters:  08/29/13 164 lb (74.39 kg)    Weight Hx: Wt Readings from Last 10 Encounters:  08/29/13 164 lb (74.39 kg)  01/30/13 167 lb (75.751 kg)  12/25/12 160 lb (72.576 kg)  12/25/12 160 lb (72.576 kg)  12/02/12 160 lb (72.576 kg)  12/02/12 160 lb (72.576 kg)  02/10/11 154 lb (69.854 kg)    BMI:  Body mass index is 24.21 kg/(m^2). Pt meets criteria for normal weight based on current BMI.  Estimated Nutritional Needs: Kcal: 25-30 kcal/kg Protein: > 1 gram protein/kg Fluid: 1 ml/kcal  Diet Order: General Pt is also offered choice of unit snacks mid-morning and mid-afternoon.  Pt is eating as desired.   Lab results and medications reviewed.   Oran ReinLaura Naydeen Speirs, RD, LDN Clinical Inpatient Dietitian Pager:  [phone] Weekend and after hours pager:  [phone]

## 2013-08-31 NOTE — Progress Notes (Signed)
Adult Psychoeducational Group Note  Date:  08/31/2013 Time:  8:59 PM  Group Topic/Focus:  Wrap-Up Group:   The focus of this group is to help patients review their daily goal of treatment and discuss progress on daily workbooks.  Participation Level:  Active  Participation Quality:  Supportive  Affect:  Appropriate  Cognitive:  Appropriate  Insight: Good  Engagement in Group:  Engaged  Modes of Intervention:  Clarification  Additional Comments:  Pt was in group... While in group he connected with another pt that has been thru some of the same things he is going thru now   Maille Halliwell R 08/31/2013, 8:59 PM

## 2013-08-31 NOTE — [facility] Suicide Risk Assessment (Signed)
   Nursing information obtained from:    Demographic factors:    Current Mental Status:    Loss Factors:    Historical Factors:    Risk Reduction Factors:    Total Time spent with patient: 30 minutes  CLINICAL FACTORS:   Severe Anxiety and/or Agitation Bipolar Disorder:   Mixed State Depression:   Aggression Anhedonia Comorbid alcohol abuse/dependence Hopelessness Impulsivity Insomnia Alcohol/Substance Abuse/Dependencies More than one psychiatric diagnosis  Psychiatric Specialty Exam: Physical Exam  Psychiatric: His speech is normal. His mood appears anxious. He is agitated. Thought content is paranoid. Cognition and memory are normal. He expresses impulsivity. He exhibits a depressed mood.    Review of Systems  Constitutional: Positive for malaise/fatigue.  HENT: Negative.   Eyes: Negative.   Respiratory: Negative.   Cardiovascular: Negative.   Gastrointestinal: Positive for nausea.  Genitourinary: Negative.   Musculoskeletal: Positive for myalgias.  Skin: Negative.   Neurological: Positive for tremors and weakness.  Endo/Heme/Allergies: Negative.   Psychiatric/Behavioral: Positive for depression and substance abuse. The patient is nervous/anxious and has insomnia.     Blood pressure 114/76, pulse 115, temperature 98.9 F (37.2 C), temperature source Oral, resp. rate 16, height 5\' 9"  (1.753 m), weight 74.39 kg (164 lb), SpO2 99.00%.Body mass index is 24.21 kg/(m^2).  General Appearance: Disheveled  Eye Contact::  Minimal  Speech:  Garbled  Volume:  Decreased  Mood:  Anxious and Depressed  Affect:  Constricted  Thought Process:  Goal Directed  Orientation:  Full (Time, Place, and Person)  Thought Content:  Negative  Suicidal Thoughts:  No  Homicidal Thoughts:  No  Memory:  Immediate;   Fair Recent;   Fair Remote;   Fair  Judgement:  Impaired  Insight:  Lacking  Psychomotor Activity:  Decreased  Concentration:  Fair  Recall:  [employer]is[employer]Fair  Fund of Knowledge:Fair   Language: Fair  Akathisia:  No  Handed:  Right  AIMS (if indicated):     Assets:  Communication Skills Desire for Improvement Physical Health  Sleep:  Number of Hours: 6.75   Musculoskeletal: Strength & Muscle Tone: abnormal Gait & Station: unsteady Patient leans: Front  COGNITIVE FEATURES THAT CONTRIBUTE TO RISK:  Closed-mindedness Polarized thinking    SUICIDE RISK:   Minimal: No identifiable suicidal ideation.  Patients presenting with no risk factors but with morbid ruminations; may be classified as minimal risk based on the severity of the depressive symptoms  PLAN OF CARE:1. Admit for crisis management and stabilization. 2. Medication management to reduce current symptoms to base line and improve the  patient's overall level of functioning 3. Treat health problems as indicated. 4. Develop treatment plan to decrease risk of relapse upon discharge and the need for readmission. 5. Psycho-social education regarding relapse prevention and self care. 6. Health care follow up as needed for medical problems. 7. Restart home medications where appropriate.   I certify that inpatient services furnished can reasonably be expected to improve the patient's condition.  Ambra Haverstick,MD 08/31/2013, 10:38 AM

## 2013-08-31 NOTE — Progress Notes (Signed)
D: Patient denies SI/HI and A/V hallucinations   A: Monitored q 15 minutes; patient encouraged to attend groups; patient educated about medications; patient given medications per physician orders; patient encouraged to express feelings and/or concerns  R: Patient laid in the bed all morning and would not respond until the physician came into the room to speak to him; patient refused morning medications but received his medications later in the day and bed also allowed his vital signs to be done; patient's interaction with staff and peers is minimal and he walks up and down the hallway; patient is cautious and guarded but is more pleasant this afternoon then earlier in the day; patient was able to set goal to talk with staff 1:1 when having feelings of SI

## 2013-08-31 NOTE — Progress Notes (Signed)
Patient refused to come to the medication window and when writer tried to take medication to the patient room and patient put up his hands waving saying " No, No I don't want any medications, I refused"; patient closed his eyes and would not respond to any questions
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
## 2013-08-31 NOTE — Tx Team (Signed)
  Interdisciplinary Treatment Plan Update   Date Reviewed:  08/31/2013  Time Reviewed:  3:45 PM  Progress in Treatment:   Attending groups: No Participating in groups: No Taking medication as prescribed: Yes  Tolerating medication: Yes Family/Significant other contact made: No Patient understands diagnosis: Yes AEB asking for help with feeling overwhelming sadness and anger Discussing patient identified problems/goals with staff: Yes  See initial care plan Medical problems stabilized or resolved: Yes Denies suicidal/homicidal ideation: Yes  In tx team Patient has not harmed self or others: Yes  For review of initial/current patient goals, please see plan of care.  Estimated Length of Stay:  4-5 days  Reason for Continuation of Hospitalization: Depression Medication stabilization  New Problems/Goals identified:  N/A  Discharge Plan or Barriers:   return home, follow up outpt  Additional Comments:  43 [facility] with a plan to shoot himself. He was [facility] on 08/27/2013 where he was admitted for alcohol detox and reports that he started drinking again on Monday following discharge. He is very soft-spoken during assessment, difficult for writer to hear and understand as his speech is garbled. He reports that he continues to have SI with a plan to shoot himself or that he he would "do it the best way that he could do it".    Attendees:  Signature: Thedore MinsMojeed Akintayo, MD 08/31/2013 3:45 PM   Signature: Richelle Itood Kataleena Holsapple, LCSW 08/31/2013 3:45 PM  Signature: Fransisca KaufmannLaura Davis, NP 08/31/2013 3:45 PM  Signature: Joslyn Devonaroline Beaudry, RN 08/31/2013 3:45 PM  Signature: Liborio NixonPatrice White, RN 08/31/2013 3:45 PM  Signature:  08/31/2013 3:45 PM  Signature:   08/31/2013 3:45 PM  Signature:    Signature:    Signature:    Signature:    Signature:    Signature:      Scribe for Treatment Team:   Richelle Itood Murry Khiev, LCSW  08/31/2013 3:45 PM

## 2013-09-01 NOTE — Progress Notes (Signed)
I agree with the assessment and plan.

## 2013-09-01 NOTE — ED Provider Notes (Signed)
Medical screening examination/treatment/procedure(s) were performed by non-physician practitioner and as supervising physician I was immediately available for consultation/collaboration.   EKG Interpretation None         Celene Pippins, MD 09/01/13 0646 

## 2013-09-01 NOTE — Progress Notes (Signed)
Patient with minimal interaction on the unit. 1:1 he displays an intense, flat affect with anxious mood. Pt also thought blocking. Complaining of some withdrawal and CIWA is a "4" with stable VS. Pt was med compliant and given ativan prn with good results. He denies SI/HI/AVH and remains safe, presently resting in bed. Lawrence MarseillesFriedman, Garet Hooton Eakes

## 2013-09-01 NOTE — Plan of Care (Signed)
Problem: Diagnosis: Increased Risk For Suicide Attempt Goal: STG-Patient Will Attend All Groups On The Unit Outcome: Not Progressing Patient not attending groups. Per chart, when he does, he has limited interaction.   Problem: Alteration in mood Goal: STG-Patient reports thoughts of self-harm to staff Outcome: Progressing Patient is denying SI at this time and no acts of self harm while on unit.

## 2013-09-01 NOTE — Progress Notes (Signed)
Patient ID: [name], male   DOB: 08/14/1970, 43 y.o.   MRN: [id number] D. Patient presents with anxious mood, affect blunted. Harvie HeckRandy presents very disheveled and poor hygiene this morning. Pt would not elaborate or discuss his mood. He denies any AH/VH. Harvie HeckRandy has been isolative to his room throughout most of shift thus far. He did complete his self inventory, and rates his depression at 8/10 on depression scale,10 being worst depression 1 being least. He denies any SI/HI. Pt continues to be monitored on CIWA protocol, and denies any withdrawal symptoms at this time. A. Medications given as ordered. Support and encouragement provided. Discussed patients progress with L. Earlene Plateravis NP. Encouraged po fluids. R. Patient is in no acute distress at this time. Will continue to monitor q 15 minutes for safety.

## 2013-09-01 NOTE — [facility] Group Notes (Signed)
[facility] Group Notes: (Clinical Social Work)   09/01/2013      Type of Therapy:  Group Therapy   Participation Level:  Did Not Attend - came in for a few minutes, but left again quickly    Ambrose MantleMareida Grossman-Orr, LCSW 09/01/2013, 4:30 PM

## 2013-09-01 NOTE — Progress Notes (Signed)
Patient ID: [name], male   DOB: 1970-04-16, 43 y.o.   MRN: [id number] D: Patient up and interacting with peers. Pt mood and affect is brighter then yesterday. Pt rates depression as 5 on a 0-10 scale. Pt report he feels better and taking is medication.  Cooperative with assessment. No acute distressed noted at this time.   A: Met with pt 1:1. Medications administered as prescribed. Writer encouraged pt to discuss feelings. Pt encouraged to come to staff with any question or concerns.   R: Patient remains safe. He is complaint with medications and denies any adverse reaction. Continue current POC.

## 2013-09-01 NOTE — Progress Notes (Signed)
Patient ID: [name], male   DOB: May 26, 1970, 43 y.o.   MRN: [id number] Psychoeducational Group Note  Date:  09/01/2013 Time:  0910  Group Topic/Focus:  inventory group   Participation Level: Did Not Attend  Participation Quality:  Not Applicable  Affect:  Not Applicable  Cognitive:  Not Applicable  Insight:  Not Applicable  Engagement in Group: Not Applicable  Additional Comments:  Did not attend.   [name], [name] 09/01/2013, 10:12 AM

## 2013-09-01 NOTE — Progress Notes (Signed)
[facility] MD Progress Note  09/01/2013 11:53 AM [name]  MRN:  [id number] Subjective:   Patient states "I don't want to talk right now. Come back later. Bye, Bye."   Objective:  Patient is seen and chart is reviewed. The patient appears extremely disheveled today. He makes no eye contact during assessment. The patient becomes irritable as questions are asked and eventually quits responding. [name] has not been attending groups today. He was able to report having some withdrawal symptoms. Patient refused medications yesterday but has been compliant today.   Diagnosis:   DSM5: Total Time spent with patient: 15 minutes AXIS I: Bipolar 1 disorder, most recent episode mixed  Anxiety state, unspecified  Alcohol dependence  AXIS II: Deferred  AXIS III:  Past Medical History   Diagnosis  Date   .  Bipolar 1 disorder    .  Anxiety    .  Stroke     AXIS IV: economic problems, educational problems, housing problems, other psychosocial or environmental problems and problems with primary support group  AXIS V: 41-50 serious symptoms  ADL's:  Impaired  Sleep: Fair  Appetite:  Poor  Suicidal Ideation:  Denies Homicidal Ideation:  Denies AEB (as evidenced by):  Psychiatric Specialty Exam: Physical Exam  Review of Systems  Constitutional: Positive for malaise/fatigue.  HENT: Negative for congestion, ear pain, hearing loss, nosebleeds and tinnitus.   Eyes: Negative for blurred vision, double vision, photophobia, pain and discharge.  Respiratory: Negative for cough, hemoptysis, sputum production and shortness of breath.   Cardiovascular: Negative for chest pain, palpitations, orthopnea, claudication and leg swelling.  Gastrointestinal: Negative for heartburn, nausea, vomiting, abdominal pain, diarrhea and constipation.  Genitourinary: Negative for dysuria, urgency, frequency and flank pain.  Musculoskeletal: Positive for myalgias. Negative for back pain, joint pain and neck pain.  Skin:  Negative.   Neurological: Negative for dizziness, tingling, tremors, sensory change, speech change, focal weakness and headaches.  Endo/Heme/Allergies: Negative for environmental allergies and polydipsia. Does not bruise/bleed easily.  Psychiatric/Behavioral: Positive for depression and substance abuse. The patient is nervous/anxious and has insomnia.     Blood pressure 108/66, pulse 108, temperature 97.7 F (36.5 C), temperature source Oral, resp. rate 16, height 5\' 9"  (1.753 m), weight 74.39 kg (164 lb), SpO2 100.00%.Body mass index is 24.21 kg/(m^2).  General Appearance: Disheveled  Eye Solicitor::  None  Speech:  Slow  Volume:  Decreased  Mood:  Irritable  Affect:  Constricted  Thought Process:  Coherent  Orientation:  Full (Time, Place, and Person)  Thought Content:  Negative  Suicidal Thoughts:  No  Homicidal Thoughts:  No  Memory:  Immediate;   Fair Recent;   Fair Remote;   Fair  Judgement:  Impaired  Insight:  Lacking  Psychomotor Activity:  Decreased  Concentration:  Fair  Recall:  [employer] of Knowledge:Fair  Language: Fair  Akathisia:  No  Handed:  Right  AIMS (if indicated):     Assets:  Communication Skills Desire for Improvement Physical Health Resilience  Sleep:  Number of Hours: 6   Musculoskeletal: Strength & Muscle Tone: abnormal Gait & Station: unsteady Patient leans: Front  Current Medications: Current Facility-Administered Medications  Medication Dose Route Frequency Provider Last Rate Last Dose  . acetaminophen (TYLENOL) tablet 650 mg  650 mg Oral Q6H PRN Audrea Muscat, NP   650 mg at 08/31/13 2110  . alum & mag hydroxide-simeth (MAALOX/MYLANTA) 200-200-20 MG/5ML suspension 30 mL  30 mL Oral Q4H PRN Evanna Janann August,  NP      . FLUoxetine (PROZAC) capsule 10 mg  10 mg Oral Daily Mojeed Akintayo   10 mg at 09/01/13 0812  . folic acid (FOLVITE) tablet 1 mg  1 mg Oral Daily Evanna Janann August, NP   1 mg at 09/01/13 0865  . gabapentin  (NEURONTIN) capsule 200 mg  200 mg Oral TID Mojeed Akintayo   200 mg at 09/01/13 7846  . haloperidol (HALDOL) tablet 5 mg  5 mg Oral Q6H PRN Kerry Hough, PA-C   5 mg at 09/01/13 9629   Or  . haloperidol lactate (HALDOL) injection 5 mg  5 mg Intramuscular Q6H PRN Kerry Hough, PA-C      . hydrOXYzine (ATARAX/VISTARIL) tablet 25 mg  25 mg Oral Q6H PRN Kerry Hough, PA-C      . loperamide (IMODIUM) capsule 2-4 mg  2-4 mg Oral PRN Kerry Hough, PA-C      . LORazepam (ATIVAN) tablet 1 mg  1 mg Oral Q6H PRN Audrea Muscat, NP   1 mg at 09/01/13 5284   Or  . LORazepam (ATIVAN) injection 1 mg  1 mg Intravenous Q6H PRN Evanna Janann August, NP      . LORazepam (ATIVAN) tablet 1 mg  1 mg Oral Q6H PRN Kerry Hough, PA-C      . LORazepam (ATIVAN) tablet 1 mg  1 mg Oral BID Kerry Hough, PA-C       Followed by  . [START ON 09/03/2013] LORazepam (ATIVAN) tablet 1 mg  1 mg Oral Daily Spencer E Simon, PA-C      . magnesium hydroxide (MILK OF MAGNESIA) suspension 30 mL  30 mL Oral Daily PRN Evanna Janann August, NP      . multivitamin with minerals tablet 1 tablet  1 tablet Oral Daily Kerry Hough, PA-C   1 tablet at 09/01/13 0813  . naproxen (NAPROSYN) tablet 500 mg  500 mg Oral BID PRN Kerry Hough, PA-C      . nicotine (NICODERM CQ - dosed in mg/24 hours) patch 21 mg  21 mg Transdermal Daily Mojeed Akintayo   21 mg at 09/01/13 0813  . ondansetron (ZOFRAN-ODT) disintegrating tablet 4 mg  4 mg Oral Q6H PRN Kerry Hough, PA-C      . paliperidone (INVEGA) 24 hr tablet 3 mg  3 mg Oral Daily Evanna Janann August, NP   3 mg at 09/01/13 1324  . thiamine (B-1) injection 100 mg  100 mg Intramuscular Once [employer], PA-C      . thiamine (VITAMIN B-1) tablet 100 mg  100 mg Oral Daily Evanna Cori Merry Proud, NP   100 mg at 09/01/13 4010   Or  . thiamine (B-1) injection 100 mg  100 mg Intravenous Daily Evanna Janann August, NP      . traZODone (DESYREL) tablet 100 mg  100 mg Oral QHS Mojeed  Akintayo   100 mg at 08/31/13 2111    Lab Results: No results found for this or any previous visit (from the past 48 hour(s)).  Physical Findings: AIMS:  , ,  ,  ,    CIWA:  CIWA-Ar Total: 3 COWS:     Treatment Plan Summary: Daily contact with patient to assess and evaluate symptoms and progress in treatment Medication management  Plan:  1. Continue crisis management and stabilization.  2. Medication management:  -Continue Prozac 10 mg daily for depression -Continue Neurontin 200 mg TID for improved mood  stability -Continue Ativan taper for alcohol detox -Continue Invega 3 mg daily for mood control -Continue Trazodone 100 mg hs for insomnia.  3. Encouraged patient to attend groups and participate in group counseling sessions and activities.  4. Discharge plan in progress.  5. Continue current treatment plan.  6. Address health issues: Vitals reviewed and stable. Repeat chemistry panel to evaluate potassium level and liver enzymes.   Medical Decision Making Problem Points:  Established problem, worsening (2) and Review of psycho-social stressors (1) Data Points:  Review or order clinical lab tests (1) Review of medication regiment & side effects (2)  I certify that inpatient services furnished can reasonably be expected to improve the patient's condition.   Macklyn Glandon NP-C 09/01/2013, 11:53 AM

## 2013-09-01 NOTE — Progress Notes (Signed)
Patient ID: [name], male   DOB: 1970-06-05, 43 y.o.   MRN: [id number] Psychoeducational Group Note  Date:  09/01/2013 Time:  0930  Group Topic/Focus:  healthy coping skills.   Participation Level: Did Not Attend  Participation Quality:  Not Applicable  Affect:  Not Applicable  Cognitive:  Not Applicable  Insight:  Not Applicable  Engagement in Group: Not Applicable  Additional Comments:  Did not attend.   [name], [name] 09/01/2013, 10:13 AM

## 2013-09-02 LAB — COMPREHENSIVE METABOLIC PANEL
ALT: 68 U/L — ABNORMAL HIGH (ref 0–53)
AST: 36 U/L (ref 0–37)
Albumin: 3.6 g/dL (ref 3.5–5.2)
Alkaline Phosphatase: 94 U/L (ref 39–117)
Anion gap: 12 (ref 5–15)
BILIRUBIN TOTAL: 0.2 mg/dL — AB (ref 0.3–1.2)
BUN: 13 mg/dL (ref 6–23)
CHLORIDE: 100 meq/L (ref 96–112)
CO2: 28 mEq/L (ref 19–32)
CREATININE: 0.94 mg/dL (ref 0.50–1.35)
Calcium: 9.5 mg/dL (ref 8.4–10.5)
GFR calc Af Amer: >90 mL/min (ref >90)
GFR calc non Af Amer: >90 mL/min (ref >90)
Glucose, Bld: 110 mg/dL — ABNORMAL HIGH (ref 70–99)
POTASSIUM: 3.9 meq/L (ref 3.7–5.3)
Sodium: 140 mEq/L (ref 137–147)
Total Protein: 7.1 g/dL (ref 6.0–8.3)

## 2013-09-02 MED ORDER — FLUOXETINE HCL 20 MG PO CAPS
20.0000 mg | ORAL_CAPSULE | Freq: Every day | ORAL | Status: DC
  Administered 2013-09-03 – 2013-09-05 (×3): 20 mg via ORAL
  Filled 2013-09-02 (×2): qty 1
  Filled 2013-09-02: qty 3
  Filled 2013-09-02 (×2): qty 1

## 2013-09-02 NOTE — Progress Notes (Signed)
Patient ID: [name], male   DOB: Mar 24, 1970, 43 y.o.   MRN: [id number] [facility] MD Progress Note  09/02/2013 12:24 PM [name]  MRN:  [id number] Subjective:   Patient states "I'm doing better than yesterday. I feel good. I just need to stay away from alcohol. I will get a sponsor. I also have an ACT team. When will I be able to leave?"  Objective:  Patient is seen and chart is reviewed. He is much more pleasant today with this Clinical research associate. Patient remains disheveled appearing and has been isolating in his room. Nursing staff report that his mood has been very irritable. This morning he refused to come to the medication window for medications. He took his medications when they were brought to his room. Patient appears depressed during assessment.   Diagnosis:   DSM5: Total Time spent with patient: 20 minutes AXIS I: Bipolar 1 disorder, most recent episode mixed  Anxiety state, unspecified  Alcohol dependence  AXIS II: Deferred  AXIS III:  Past Medical History   Diagnosis  Date   .  Bipolar 1 disorder    .  Anxiety    .  Stroke     AXIS IV: economic problems, educational problems, housing problems, other psychosocial or environmental problems and problems with primary support group  AXIS V: 41-50 serious symptoms  ADL's:  Impaired  Sleep: Fair  Appetite:  Poor  Suicidal Ideation:  Denies Homicidal Ideation:  Denies AEB (as evidenced by):  Psychiatric Specialty Exam: Physical Exam  Review of Systems  Constitutional: Positive for malaise/fatigue.  HENT: Negative for congestion, ear pain, hearing loss, nosebleeds and tinnitus.   Eyes: Negative for blurred vision, double vision, photophobia, pain and discharge.  Respiratory: Negative for cough, hemoptysis, sputum production and shortness of breath.   Cardiovascular: Negative for chest pain, palpitations, orthopnea, claudication and leg swelling.  Gastrointestinal: Negative for heartburn, nausea, vomiting, abdominal pain,  diarrhea and constipation.  Genitourinary: Negative for dysuria, urgency, frequency and flank pain.  Musculoskeletal: Positive for myalgias. Negative for back pain, joint pain and neck pain.  Skin: Negative.   Neurological: Negative for dizziness, tingling, tremors, sensory change, speech change, focal weakness and headaches.  Endo/Heme/Allergies: Negative for environmental allergies and polydipsia. Does not bruise/bleed easily.  Psychiatric/Behavioral: Positive for depression and substance abuse. The patient is nervous/anxious and has insomnia.     Blood pressure 103/70, pulse 113, temperature 98.7 F (37.1 C), temperature source Oral, resp. rate 18, height 5\' 9"  (1.753 m), weight 74.39 kg (164 lb), SpO2 100.00%.Body mass index is 24.21 kg/(m^2).  General Appearance: Disheveled  Eye Solicitor::  None  Speech:  Slow  Volume:  Decreased  Mood:  Irritable  Affect:  Constricted  Thought Process:  Coherent  Orientation:  Full (Time, Place, and Person)  Thought Content:  Negative  Suicidal Thoughts:  No  Homicidal Thoughts:  No  Memory:  Immediate;   Fair Recent;   Fair Remote;   Fair  Judgement:  Impaired  Insight:  Lacking  Psychomotor Activity:  Decreased  Concentration:  Fair  Recall:  [employer] of Knowledge:Fair  Language: Fair  Akathisia:  No  Handed:  Right  AIMS (if indicated):     Assets:  Communication Skills Desire for Improvement Physical Health Resilience  Sleep:  Number of Hours: 6.75   Musculoskeletal: Strength & Muscle Tone: abnormal Gait & Station: unsteady Patient leans: Front  Current Medications: Current Facility-Administered Medications  Medication Dose Route Frequency Provider Last Rate Last Dose  .  acetaminophen (TYLENOL) tablet 650 mg  650 mg Oral Q6H PRN Audrea MuscatEvanna Cori Burkett, NP   650 mg at 08/31/13 2110  . alum & mag hydroxide-simeth (MAALOX/MYLANTA) 200-200-20 MG/5ML suspension 30 mL  30 mL Oral Q4H PRN Evanna Janann Augustori Burkett, NP      . FLUoxetine  (PROZAC) capsule 10 mg  10 mg Oral Daily Mojeed Akintayo   10 mg at 09/02/13 0753  . folic acid (FOLVITE) tablet 1 mg  1 mg Oral Daily Evanna Janann Augustori Burkett, NP   1 mg at 09/02/13 0753  . gabapentin (NEURONTIN) capsule 200 mg  200 mg Oral TID Mojeed Akintayo   200 mg at 09/02/13 1134  . haloperidol (HALDOL) tablet 5 mg  5 mg Oral Q6H PRN Kerry HoughSpencer E Simon, PA-C   5 mg at 09/01/13 [id number]   Or  . haloperidol lactate (HALDOL) injection 5 mg  5 mg Intramuscular Q6H PRN Kerry HoughSpencer E Simon, PA-C      . LORazepam (ATIVAN) tablet 1 mg  1 mg Oral Q6H PRN Audrea MuscatEvanna Cori Burkett, NP   1 mg at 09/01/13 2054   Or  . LORazepam (ATIVAN) injection 1 mg  1 mg Intravenous Q6H PRN Evanna Janann Augustori Burkett, NP      . Melene Muller[START ON 09/03/2013] LORazepam (ATIVAN) tablet 1 mg  1 mg Oral Daily Spencer E Simon, PA-C      . magnesium hydroxide (MILK OF MAGNESIA) suspension 30 mL  30 mL Oral Daily PRN Evanna Janann Augustori Burkett, NP      . multivitamin with minerals tablet 1 tablet  1 tablet Oral Daily Kerry HoughSpencer E Simon, PA-C   1 tablet at 09/02/13 0753  . naproxen (NAPROSYN) tablet 500 mg  500 mg Oral BID PRN Kerry HoughSpencer E Simon, PA-C      . nicotine (NICODERM CQ - dosed in mg/24 hours) patch 21 mg  21 mg Transdermal Daily Mojeed Akintayo   21 mg at 09/01/13 0813  . paliperidone (INVEGA) 24 hr tablet 3 mg  3 mg Oral Daily Evanna Janann Augustori Burkett, NP   3 mg at 09/02/13 0753  . thiamine (B-1) injection 100 mg  100 mg Intramuscular Once IntelSpencer E Simon, PA-C      . thiamine (VITAMIN B-1) tablet 100 mg  100 mg Oral Daily Evanna Cori Merry ProudBurkett, NP   100 mg at 09/02/13 0753   Or  . thiamine (B-1) injection 100 mg  100 mg Intravenous Daily Evanna Janann Augustori Burkett, NP      . traZODone (DESYREL) tablet 100 mg  100 mg Oral QHS Mojeed Akintayo   100 mg at 09/01/13 2053    Lab Results: No results found for this or any previous visit (from the past 48 hour(s)).  Physical Findings: AIMS:  , ,  ,  ,    CIWA:  CIWA-Ar Total: 4 COWS:     Treatment Plan Summary: Daily  contact with patient to assess and evaluate symptoms and progress in treatment Medication management  Plan:  1. Continue crisis management and stabilization.  2. Medication management:  -Increase Prozac to 20 mg daily for depression -Continue Neurontin 200 mg TID for improved mood stability -Continue Ativan taper for alcohol detox -Continue Invega 3 mg daily for mood control -Continue Trazodone 100 mg hs for insomnia.  3. Encouraged patient to attend groups and participate in group counseling sessions and activities.  4. Discharge plan in progress.  5. Continue current treatment plan.  6. Address health issues: Vitals reviewed and stable. Repeat chemistry panel to evaluate potassium  level and liver enzymes.   Medical Decision Making Problem Points:  Established problem, stable/improving (1) and Review of psycho-social stressors (1) Data Points:  Review or order clinical lab tests (1) Review of medication regiment & side effects (2) Review of new medications or change in dosage (2)  I certify that inpatient services furnished can reasonably be expected to improve the patient's condition.   Arieh Bogue NP-C 09/02/2013, 12:24 PM

## 2013-09-02 NOTE — Plan of Care (Signed)
Problem: Diagnosis: Increased Risk For Suicide Attempt Goal: LTG-Patient Will Report Improved Mood and Deny Suicidal LTG (by discharge) Patient will report improved mood and deny suicidal ideation.  Outcome: Progressing Pt denies SI this evening.   Goal: STG-Patient Will Attend All Groups On The Unit Outcome: Progressing Pt attended group this evening.   Goal: STG-Patient Will Report Suicidal Feelings to Staff Outcome: Progressing Pt denies SI this evening.   Goal: STG-Patient Will Comply With Medication Regime Outcome: Progressing Pt compliant with HS medication.

## 2013-09-02 NOTE — Progress Notes (Signed)
I agree with the assessment and plan.

## 2013-09-02 NOTE — [facility] Group Notes (Signed)
[facility] Group Notes: (Clinical Social Work)   09/02/2013      Type of Therapy:  Group Therapy   Participation Level:  Did Not Attend    Ambrose MantleMareida Grossman-Orr, LCSW 09/02/2013, 12:35 PM

## 2013-09-02 NOTE — Progress Notes (Signed)
Adult Psychoeducational Group Note  Date:  09/02/2013 Time:  9:18 PM  Group Topic/Focus:  Wrap-Up Group:   The focus of this group is to help patients review their daily goal of treatment and discuss progress on daily workbooks.  Participation Level:  Active  Participation Quality:  Appropriate  Affect:  Appropriate  Cognitive:  Alert  Insight: Good  Engagement in Group:  Engaged  Modes of Intervention:  Discussion  Additional Comments:  Pt rated his day a 6/10. He explained that he's feeling better. His goal is to stay sober. He was alert and appropriate during group.   [place]ford Shihomas, Brendyn Mclaren K 09/02/2013, 9:18 PM

## 2013-09-02 NOTE — Progress Notes (Signed)
  Psychoeducational Group Note  Date: 09/02/2013   Time:0910am   Group Topic/Focus: Pt Self Inventory  Participation Level: Did Not Attend

## 2013-09-02 NOTE — Progress Notes (Signed)
The focus of this group is to help patients review their daily goal of treatment and discuss progress on daily workbooks. Pt attended the evening group session and responded to all discussion prompts from the Writer. Pt shared that today was a good day on the unit, the highlight of which was talking to his mother and father on the phone. Pt told the group that his goal for the coming week was to celebrate his birthday sober and not go back to alcohol. Encouragement given by the Writer and by the Pt's peers. Pt's affect was flat.

## 2013-09-02 NOTE — Progress Notes (Signed)
Patient ID: [name], male DOB: [birth year], 43 y.o. MRN: [id number]  Psychoeducational Group Note  Date: 09/02/2013   Time: 0945  Group Topic/Focus: Healthy Support Systems  Participation Level: Did Not Attend

## 2013-09-02 NOTE — Progress Notes (Signed)
D: Pt presents with flat affect, depressed mood.  Pt attended evening group.  Minimal interaction with peers.  Pt reported he had a headache of 6/10.  Calm, cooperative with staff.  Pt denies SI/HI.  Denies AH/VH.  Denies symptoms of detox.  A: Medications given as ordered.  Pt received Tylenol 650mg  PO PRN for pain.  Support and encouragement provided.  R: Pain was 0/10 when reassessed.  No distress at this time.  Will continue to monitor for safety.

## 2013-09-02 NOTE — Progress Notes (Signed)
Patient ID: [name], male   DOB: 1970-03-31, 43 y.o.   MRN: [id number] D. Patient presents with anxious mood, affect blunted again today. [name] continues to present with disheveled appearance. He also remains irritable in am, refusing to get out of bed and take meds. He did comply when Clinical research associatewriter approached with medications in his room. He denies any withdrawal symptoms.  He continues to deny any AH/VH, but  has been isolative to his room throughout most of shift thus far again today. He did declined to complete self inventory this am. A. Medications given as ordered. Support and encouragement provided. Discussed patients progress with L. Davis NP.  R. Patient is in no acute distress at this time. Will continue to monitor q 15 minutes for safety.

## 2013-09-03 NOTE — [facility] Group Notes (Signed)
[facility]   09/03/2013 10:50 AM  Participation Quality:  Engaged  Mood/Affect:  Flat  Depression Rating:  4  Anxiety Rating:  4  Thoughts of Suicide:  No Will you contract for safety?   NA  Current AVH:  No  Plan for Discharge/Comments:  "I'm ready to go home today.  I promise to take the meds."  No complaints.  Ready to go.    Transportation Means:  family  Supports: family  Kiribatiorth, Baldo DaubRodney B

## 2013-09-03 NOTE — [facility] Group Notes (Signed)
[facility] LCSW Group Therapy  09/03/2013 1:15 pm  Type of Therapy: Process Group Therapy  Participation Level:  Active  Participation Quality:  Appropriate  Affect:  Flat  Cognitive:  Oriented  Insight:  Improving  Engagement in Group:  Limited  Engagement in Therapy:  Limited  Modes of Intervention:  Activity, Clarification, Education, Problem-solving and Support  Summary of Progress/Problems: Today's group addressed the issue of overcoming obstacles.  Patients were asked to identify their biggest obstacle post d/c that stands in the way of their on-going success, and then problem solve as to how to manage this.  "My biggest challenge is not drinking alcohol."  Stated he put 6 mos of sobriety together by "not buying any," but later stated meetings were helpful-or, at least, that he attended.  He was admonished by others to go to daily mtgs and get a sponsor.   Daryel Geraldorth, Mashawn Brazil B 09/03/2013   4:07 PM

## 2013-09-03 NOTE — Progress Notes (Signed)
Patient ID: [name], male   DOB: 30-Mar-1970, 43 y.o.   MRN: [id number] [facility] MD Progress Note  09/03/2013 10:48 AM [name]  MRN:  [id number] Subjective:   Patient reports:  "I'm feeling less depressed or moody. I think I need to find a way to stop drinking so that my mother and I can get along well with each other.'' Objective:  Patient is seen and chart is reviewed. Patient reports decreased craving for alcohol, decreased depressive and mood symptoms. [name] denies psychosis, delusional thinking, suicidal/homicidal ideation, intent or plan. Patient has been attending the unit milieu and compliant with his medications without verbalizing any adverse effects. Diagnosis:   DSM5: Total Time spent with patient: 20 minutes AXIS I: Bipolar 1 disorder, most recent episode mixed  Anxiety state, unspecified  Alcohol dependence  AXIS II: Deferred  AXIS III:  Past Medical History   Diagnosis  Date   .  Bipolar 1 disorder    .  Anxiety    .  Stroke     AXIS IV: economic problems, educational problems, housing problems, other psychosocial or environmental problems and problems with primary support group  AXIS V: 41-50 serious symptoms  ADL's:  Impaired  Sleep: Fair  Appetite:  fair  Suicidal Ideation:  Denies Homicidal Ideation:  Denies AEB (as evidenced by):  Psychiatric Specialty Exam: Physical Exam  Psychiatric: His speech is normal and behavior is normal. His mood appears anxious. Cognition and memory are normal. He expresses impulsivity. He exhibits a depressed mood.    Review of Systems  HENT: Negative for congestion, ear pain, hearing loss, nosebleeds and tinnitus.   Eyes: Negative for blurred vision, double vision, photophobia, pain and discharge.  Respiratory: Negative for cough, hemoptysis, sputum production and shortness of breath.   Cardiovascular: Negative for chest pain, palpitations, orthopnea, claudication and leg swelling.  Gastrointestinal: Negative for  heartburn, nausea, vomiting, abdominal pain, diarrhea and constipation.  Genitourinary: Negative for dysuria, urgency, frequency and flank pain.  Musculoskeletal: Negative for back pain, joint pain and neck pain.  Skin: Negative.   Neurological: Negative for dizziness, tingling, tremors, sensory change, speech change, focal weakness and headaches.  Endo/Heme/Allergies: Negative for environmental allergies and polydipsia. Does not bruise/bleed easily.  Psychiatric/Behavioral: Positive for depression and substance abuse. The patient is nervous/anxious.     Blood pressure 98/67, pulse 100, temperature 98.7 F (37.1 C), temperature source Oral, resp. rate 18, height '5\' 9"'  (1.753 m), weight 74.39 kg (164 lb), SpO2 100.00%.Body mass index is 24.21 kg/(m^2).  General Appearance: Disheveled  Eye Sport and exercise psychologist::  None  Speech:  Slow  Volume:  Decreased  Mood:  Irritable  Affect:  Constricted  Thought Process:  Coherent  Orientation:  Full (Time, Place, and Person)  Thought Content:  Negative  Suicidal Thoughts:  No  Homicidal Thoughts:  No  Memory:  Immediate;   Fair Recent;   Fair Remote;   Fair  Judgement:  Impaired  Insight:  Lacking  Psychomotor Activity:  Decreased  Concentration:  Fair  Recall:  [employer] of Knowledge:Fair  Language: Fair  Akathisia:  No  Handed:  Right  AIMS (if indicated):     Assets:  Communication Skills Desire for Improvement Physical Health Resilience  Sleep:  Number of Hours: 6.25   Musculoskeletal: Strength & Muscle Tone: abnormal Gait & Station: unsteady Patient leans: Front  Current Medications: Current Facility-Administered Medications  Medication Dose Route Frequency Provider Last Rate Last Dose  . acetaminophen (TYLENOL) tablet 650 mg  650 mg Oral Q6H PRN Malena Peer, NP   650 mg at 09/02/13 1959  . alum & mag hydroxide-simeth (MAALOX/MYLANTA) 200-200-20 MG/5ML suspension 30 mL  30 mL Oral Q4H PRN Evanna Glenda Chroman, NP      .  FLUoxetine (PROZAC) capsule 20 mg  20 mg Oral Daily Elmarie Shiley, NP   20 mg at 09/03/13 0818  . folic acid (FOLVITE) tablet 1 mg  1 mg Oral Daily Evanna Glenda Chroman, NP   1 mg at 09/03/13 0817  . gabapentin (NEURONTIN) capsule 200 mg  200 mg Oral TID Maycel Riffe   200 mg at 09/03/13 1026  . haloperidol (HALDOL) tablet 5 mg  5 mg Oral Q6H PRN Laverle Hobby, PA-C   5 mg at 09/01/13 1191   Or  . haloperidol lactate (HALDOL) injection 5 mg  5 mg Intramuscular Q6H PRN Laverle Hobby, PA-C      . magnesium hydroxide (MILK OF MAGNESIA) suspension 30 mL  30 mL Oral Daily PRN Evanna Glenda Chroman, NP      . multivitamin with minerals tablet 1 tablet  1 tablet Oral Daily Laverle Hobby, PA-C   1 tablet at 09/03/13 4782  . naproxen (NAPROSYN) tablet 500 mg  500 mg Oral BID PRN Laverle Hobby, PA-C      . nicotine (NICODERM CQ - dosed in mg/24 hours) patch 21 mg  21 mg Transdermal Daily Aslee Such   21 mg at 09/01/13 0813  . paliperidone (INVEGA) 24 hr tablet 3 mg  3 mg Oral Daily Evanna Glenda Chroman, NP   3 mg at 09/03/13 0818  . thiamine (B-1) injection 100 mg  100 mg Intramuscular Once [employer], PA-C      . thiamine (VITAMIN B-1) tablet 100 mg  100 mg Oral Daily Evanna Glenda Chroman, NP   100 mg at 09/03/13 0818   Or  . thiamine (B-1) injection 100 mg  100 mg Intravenous Daily Evanna Glenda Chroman, NP      . traZODone (DESYREL) tablet 100 mg  100 mg Oral QHS Jovee Dettinger   100 mg at 09/02/13 2036    Lab Results:  Results for orders placed during the hospital encounter of 08/29/13 (from the past 48 hour(s))  COMPREHENSIVE METABOLIC PANEL     Status: Abnormal   Collection Time    09/02/13  7:30 PM      Result Value Ref Range   Sodium 140  137 - 147 mEq/L   Potassium 3.9  3.7 - 5.3 mEq/L   Chloride 100  96 - 112 mEq/L   CO2 28  19 - 32 mEq/L   Glucose, Bld 110 (*) 70 - 99 mg/dL   BUN 13  6 - 23 mg/dL   Creatinine, Ser 0.94  0.50 - 1.35 mg/dL   Calcium 9.5  8.4 - 10.5 mg/dL    Total Protein 7.1  6.0 - 8.3 g/dL   Albumin 3.6  3.5 - 5.2 g/dL   AST 36  0 - 37 U/L   ALT 68 (*) 0 - 53 U/L   Alkaline Phosphatase 94  39 - 117 U/L   Total Bilirubin 0.2 (*) 0.3 - 1.2 mg/dL   GFR calc non Af Amer >90  >90 mL/min   GFR calc Af Amer >90  >90 mL/min   Comment: (NOTE)     The eGFR has been calculated using the CKD EPI equation.     This calculation has not been validated  in all clinical situations.     eGFR's persistently <90 mL/min signify possible Chronic Kidney     Disease.   Anion gap 12  5 - 15   Comment: Performed at [facility]    Physical Findings: AIMS:  , ,  ,  ,    CIWA:  CIWA-Ar Total: 0 COWS:     Treatment Plan Summary: Daily contact with patient to assess and evaluate symptoms and progress in treatment Medication management  Plan:  1. Continue crisis management and stabilization.  2. Medication management:  -ContinueProzac  20 mg daily for depression -Continue Neurontin 200 mg TID for improved mood stability -Continue Ativan taper for alcohol detox -Continue Invega 3 mg daily for mood control -Continue Trazodone 100 mg hs for insomnia.  3. Encouraged patient to attend groups and participate in group counseling sessions and activities.  4. Discharge plan in progress.  5. Continue current treatment plan.  6. Address health issues: Vitals reviewed and stable. Repeat chemistry panel to evaluate potassium level and liver enzymes.   Medical Decision Making Problem Points:  Established problem, improving (1) and Review of psycho-social stressors (1) Data Points:  Review or order clinical lab tests (1) Review of medication regiment & side effects (2) Review of new medications or change in dosage (2)  I certify that inpatient services furnished can reasonably be expected to improve the patient's condition.   Corena Pilgrim, MD 09/03/2013, 10:48 AM

## 2013-09-03 NOTE — Progress Notes (Signed)
D-pt sts he has moderate depression and hopelessness, pt sts he wants to go home and sts he has stopped drinking alcohol A-pt was active in group this am and took his medications R-cont to monitor for safety

## 2013-09-03 NOTE — Progress Notes (Signed)
Adult Psychoeducational Group Note  Date:  09/03/2013 Time:  8:58 PM  Group Topic/Focus:  Wrap-Up Group:   The focus of this group is to help patients review their daily goal of treatment and discuss progress on daily workbooks.  Participation Level:  Minimal  Participation Quality:  Drowsy  Affect:  Flat  Cognitive:  Lacking  Insight: Limited  Engagement in Group:  Limited  Modes of Intervention:  Socialization and Support  Additional Comments:  Patient attended and participated in group tonight. He reports talking to family on the phone today. He advised that he will be leaving on Wednesday, which is his birthday, therefore he will be celebrating that day. He plans to hopefully stay sober. Today he went to groups and went for his meals.  Lita MainsFrancis, Kerim Statzer [facility] 09/03/2013, 8:58 PM

## 2013-09-04 NOTE — [facility] Group Notes (Signed)
Adult Psychoeducational Group Note  Date:  09/04/2013 Time:  8:53 PM  Group Topic/Focus:  Wrap-Up Group:   The focus of this group is to help patients review their daily goal of treatment and discuss progress on daily workbooks.  Participation Level:  Minimal  Participation Quality:  Attentive  Affect:  Flat  Cognitive:  Appropriate  Insight: Lacking  Engagement in Group:  Limited  Modes of Intervention:  Discussion  Additional Comments:  [name] stated that his day was relaxed.  He expressed that his medication had started kicking in.  He also stated he was discharging home tomorrow but has an ACT team to follow up with him.  He said he was going to try to stay sober and he has been in deep thought about facing the reality.  [name], [name] 09/04/2013, 8:53 PM

## 2013-09-04 NOTE — Progress Notes (Signed)
D:Pt rates his depression and hopelessness as a 4 on 1-10 scale with 10 being the most. Pt reports that his energy and ability to pay attention is improving He denies hallucinations and pain.  A:Offered support, encouragement and 15 minute checks. R:Pt denies si and hi. Safety maintained on the unit.

## 2013-09-04 NOTE — [facility] Group Notes (Signed)
[facility] LCSW Group Therapy  09/04/2013 , 12:13 PM   Type of Therapy:  Group Therapy  Participation Level:  Active  Participation Quality:  Attentive  Affect:  Appropriate  Cognitive:  Alert  Insight:  Improving  Engagement in Therapy:  Engaged  Modes of Intervention:  Discussion, Exploration and Socialization  Summary of Progress/Problems: Today's group focused on the term Diagnosis.  Participants were asked to define the term, and then pronounce whether it is a negative, positive or neutral term.  Harvie HeckRandy stayed for the entire group, and was engaged throughout.  He readily admitted that his diagnosis is alcoholism, and he is nervous about leaving.  Group members pointed out how he needs to trade this community for a 12 step community, and put as much energy into that community as he has put into this one.  He was open to this feedback.  Daryel Geraldorth, Jamaira Sherk B 09/04/2013 , 12:13 PM

## 2013-09-04 NOTE — Progress Notes (Signed)
Pt reports he is having a good day.  He states he is doing good on his medications.  He plans to continue his medications and stay sober.  He says he is supposed to discharge on Wednesday.  He stays with his mother.  He reports he has been going to groups and participating.  He denies SI/HI/AV.  Pt is pleasant/cooperative with staff.  Support and encouragement offered.  Safety maintained with q15 minute checks.

## 2013-09-04 NOTE — [facility] Group Notes (Signed)
[facility] Group Notes:  (Nursing/MHT/Case Management/Adjunct)  Date:  09/04/2013  Time:  9:45 AM  Type of Therapy: GOALS/ORIENTATION  Participation Level:  Did Not Attend  Participation Quality:    Affect:    Cognitive:    Insight:    Engagement in Group:    Modes of Intervention:    Summary of Progress/Problems:  Beatrix ShipperWright, Yanelie Abraha Martin 09/04/2013, 9:45 AM

## 2013-09-04 NOTE — Progress Notes (Signed)
Patient ID: [name], male   DOB: 04-25-1970, 43 y.o.   MRN: [id number] [facility] MD Progress Note  09/04/2013 11:17 AM [name]  MRN:  [id number] Subjective:   Patient reports: "I am feeling less depressed. My energy is improving and I can pay better attention. I will try to control my drinking. But I will be honest that I am having cravings. It's hard when people around you drink."  Objective:  Patient is seen and chart is reviewed. Patient reports decreased depressive and mood symptoms. [name] denies psychosis, delusional thinking, suicidal/homicidal ideation, intent or plan. Patient has been attending the unit milieu and compliant with his medications without verbalizing any adverse effects. He admits today having cravings for alcohol and indicates this on his self inventory sheet. Patient reports tomorrow is his birthday and that he will be tempted to have a beer. He was encouraged to use today to think about making healthier choices in the future.   Diagnosis:   DSM5: Total Time spent with patient: 15 minutes AXIS I: Bipolar 1 disorder, most recent episode mixed  Anxiety state, unspecified  Alcohol dependence  AXIS II: Deferred  AXIS III:  Past Medical History   Diagnosis  Date   .  Bipolar 1 disorder    .  Anxiety    .  Stroke     AXIS IV: economic problems, educational problems, housing problems, other psychosocial or environmental problems and problems with primary support group  AXIS V: 51-60 Moderate Symptoms   ADL's:  Impaired  Sleep: Good  Appetite: Good  Suicidal Ideation:  Denies Homicidal Ideation:  Denies AEB (as evidenced by):  Psychiatric Specialty Exam: Physical Exam  Psychiatric: His speech is normal and behavior is normal. His mood appears anxious. Cognition and memory are normal. He expresses impulsivity. He exhibits a depressed mood.    Review of Systems  Constitutional: Negative.   HENT: Negative for congestion, ear pain, hearing loss,  nosebleeds and tinnitus.   Eyes: Negative for blurred vision, double vision, photophobia, pain and discharge.  Respiratory: Negative for cough, hemoptysis, sputum production and shortness of breath.   Cardiovascular: Negative for chest pain, palpitations, orthopnea, claudication and leg swelling.  Gastrointestinal: Negative for heartburn, nausea, vomiting, abdominal pain, diarrhea and constipation.  Genitourinary: Negative for dysuria, urgency, frequency and flank pain.  Musculoskeletal: Negative for back pain, joint pain and neck pain.  Skin: Negative.   Neurological: Negative for dizziness, tingling, tremors, sensory change, speech change, focal weakness and headaches.  Endo/Heme/Allergies: Negative for environmental allergies and polydipsia. Does not bruise/bleed easily.  Psychiatric/Behavioral: Positive for depression and substance abuse. The patient is nervous/anxious.     Blood pressure 103/74, pulse 93, temperature 96.8 F (36 C), temperature source Oral, resp. rate 18, height '5\' 9"'  (1.753 m), weight 74.39 kg (164 lb), SpO2 100.00%.Body mass index is 24.21 kg/(m^2).  General Appearance: Disheveled  Eye Sport and exercise psychologist::  None  Speech:  Slow  Volume:  Normal  Mood:  Anxious  Affect:  Congruent  Thought Process:  Coherent  Orientation:  Full (Time, Place, and Person)  Thought Content:  Negative  Suicidal Thoughts:  No  Homicidal Thoughts:  No  Memory:  Immediate;   Fair Recent;   Fair Remote;   Fair  Judgement:  Impaired  Insight:  Shallow  Psychomotor Activity:  Normal  Concentration:  Fair  Recall:  [name]  Language: Fair  Akathisia:  No  Handed:  Right  AIMS (if indicated):  Assets:  Communication Skills Desire for Improvement Physical Health Resilience  Sleep:  Number of Hours: 6.75   Musculoskeletal: Strength & Muscle Tone: abnormal Gait & Station: unsteady Patient leans: Front  Current Medications: Current Facility-Administered Medications   Medication Dose Route Frequency Provider Last Rate Last Dose  . acetaminophen (TYLENOL) tablet 650 mg  650 mg Oral Q6H PRN Malena Peer, NP   650 mg at 09/02/13 1959  . alum & mag hydroxide-simeth (MAALOX/MYLANTA) 200-200-20 MG/5ML suspension 30 mL  30 mL Oral Q4H PRN Evanna Glenda Chroman, NP      . FLUoxetine (PROZAC) capsule 20 mg  20 mg Oral Daily Elmarie Shiley, NP   20 mg at 09/04/13 0240  . folic acid (FOLVITE) tablet 1 mg  1 mg Oral Daily Evanna Glenda Chroman, NP   1 mg at 09/04/13 0808  . gabapentin (NEURONTIN) capsule 200 mg  200 mg Oral TID Cornellius Kropp   200 mg at 09/04/13 9735  . haloperidol (HALDOL) tablet 5 mg  5 mg Oral Q6H PRN Laverle Hobby, PA-C   5 mg at 09/01/13 3299   Or  . haloperidol lactate (HALDOL) injection 5 mg  5 mg Intramuscular Q6H PRN Laverle Hobby, PA-C      . magnesium hydroxide (MILK OF MAGNESIA) suspension 30 mL  30 mL Oral Daily PRN Evanna Glenda Chroman, NP      . multivitamin with minerals tablet 1 tablet  1 tablet Oral Daily Laverle Hobby, PA-C   1 tablet at 09/04/13 2426  . naproxen (NAPROSYN) tablet 500 mg  500 mg Oral BID PRN Laverle Hobby, PA-C      . nicotine (NICODERM CQ - dosed in mg/24 hours) patch 21 mg  21 mg Transdermal Daily Denzal Meir   21 mg at 09/01/13 0813  . paliperidone (INVEGA) 24 hr tablet 3 mg  3 mg Oral Daily Evanna Glenda Chroman, NP   3 mg at 09/04/13 8341  . thiamine (B-1) injection 100 mg  100 mg Intramuscular Once [employer], PA-C      . thiamine (VITAMIN B-1) tablet 100 mg  100 mg Oral Daily Evanna Glenda Chroman, NP   100 mg at 09/04/13 9622   Or  . thiamine (B-1) injection 100 mg  100 mg Intravenous Daily Evanna Glenda Chroman, NP      . traZODone (DESYREL) tablet 100 mg  100 mg Oral QHS Kaius Daino   100 mg at 09/03/13 2056    Lab Results:  Results for orders placed during the hospital encounter of 08/29/13 (from the past 48 hour(s))  COMPREHENSIVE METABOLIC PANEL     Status: Abnormal   Collection Time     09/02/13  7:30 PM      Result Value Ref Range   Sodium 140  137 - 147 mEq/L   Potassium 3.9  3.7 - 5.3 mEq/L   Chloride 100  96 - 112 mEq/L   CO2 28  19 - 32 mEq/L   Glucose, Bld 110 (*) 70 - 99 mg/dL   BUN 13  6 - 23 mg/dL   Creatinine, Ser 0.94  0.50 - 1.35 mg/dL   Calcium 9.5  8.4 - 10.5 mg/dL   Total Protein 7.1  6.0 - 8.3 g/dL   Albumin 3.6  3.5 - 5.2 g/dL   AST 36  0 - 37 U/L   ALT 68 (*) 0 - 53 U/L   Alkaline Phosphatase 94  39 - 117 U/L   Total  Bilirubin 0.2 (*) 0.3 - 1.2 mg/dL   GFR calc non Af Amer >90  >90 mL/min   GFR calc Af Amer >90  >90 mL/min   Comment: (NOTE)     The eGFR has been calculated using the CKD EPI equation.     This calculation has not been validated in all clinical situations.     eGFR's persistently <90 mL/min signify possible Chronic Kidney     Disease.   Anion gap 12  5 - 15   Comment: Performed at [facility]    Physical Findings: AIMS:  , ,  ,  ,    CIWA:  CIWA-Ar Total: 0 COWS:     Treatment Plan Summary: Daily contact with patient to assess and evaluate symptoms and progress in treatment Medication management  Plan:  1. Continue crisis management and stabilization.  2. Medication management:  -Continue Prozac 20 mg daily for depression -Continue Neurontin 200 mg TID for improved mood stability -Continue Invega 3 mg daily for mood control -Continue Trazodone 100 mg hs for insomnia.  3. Encouraged patient to attend groups and participate in group counseling sessions and activities.  4. Discharge plan in progress. Anticipate discharge tomorrow.  5. Continue current treatment plan.  6. Address health issues: Vitals reviewed and stable. Recent chemistry panel shows decrease in liver enzymes and normal potassium level.   Medical Decision Making Problem Points:  Established problem, improving (1) and Review of psycho-social stressors (1) Data Points:  Review or order clinical lab tests (1) Review of medication  regiment & side effects (2)  I certify that inpatient services furnished can reasonably be expected to improve the patient's condition.   Elmarie Shiley, NP-C 09/04/2013, 11:17 AM  Patient seen, evaluated and I agree with notes by Nurse Practitioner. Corena Pilgrim, MD

## 2013-09-05 MED ORDER — ADULT MULTIVITAMIN W/MINERALS CH: 1.0000 | ORAL_TABLET | Freq: Every day | ORAL | Status: DC

## 2013-09-05 MED ORDER — TRAZODONE HCL 100 MG PO TABS: 100.0000 mg | ORAL_TABLET | Freq: Every day | ORAL | Status: DC

## 2013-09-05 MED ORDER — PALIPERIDONE ER 3 MG PO TB24: 3.0000 mg | ORAL_TABLET | Freq: Every day | ORAL | Status: DC

## 2013-09-05 MED ORDER — FLUOXETINE HCL 20 MG PO CAPS: 20.0000 mg | ORAL_CAPSULE | Freq: Every day | ORAL | Status: DC

## 2013-09-05 MED ORDER — GABAPENTIN 100 MG PO CAPS: 200.0000 mg | ORAL_CAPSULE | Freq: Three times a day (TID) | ORAL | Status: DC

## 2013-09-05 NOTE — Progress Notes (Signed)
Pt discharged from facility. Pt received all belongings from room and locker. Pt received sample meds and prescriptions. [facility] by [facility] team.

## 2013-09-05 NOTE — [facility] Suicide Risk Assessment (Signed)
   Demographic Factors:  Male, Caucasian, Low socioeconomic status and Unemployed  Total Time spent with patient: 20 minutes  Psychiatric Specialty Exam: Physical Exam  Psychiatric: He has a normal mood and affect. His speech is normal and behavior is normal. Judgment and thought content normal. Cognition and memory are normal.    Review of Systems  Constitutional: Negative.   HENT: Negative.   Eyes: Negative.   Respiratory: Negative.   Cardiovascular: Negative.   Gastrointestinal: Negative.   Genitourinary: Negative.   Musculoskeletal: Negative.   Skin: Negative.   Neurological: Negative.   Endo/Heme/Allergies: Negative.   Psychiatric/Behavioral: Negative.     Blood pressure 98/74, pulse 97, temperature 96.8 F (36 C), temperature source Oral, resp. rate 18, height 5\' 9"  (1.753 m), weight 74.39 kg (164 lb), SpO2 100.00%.Body mass index is 24.21 kg/(m^2).  General Appearance: Fairly Groomed  Patent attorneyye Contact::  Good  Speech:  Clear and Coherent  Volume:  Normal  Mood:  Euthymic  Affect:  Appropriate  Thought Process:  Goal Directed  Orientation:  Full (Time, Place, and Person)  Thought Content:  Negative  Suicidal Thoughts:  No  Homicidal Thoughts:  No  Memory:  Immediate;   Fair Recent;   Fair Remote;   Fair  Judgement:  Fair  Insight:  Fair  Psychomotor Activity:  Normal  Concentration:  Fair  Recall:  [employer]  Fund of Knowledge:Fair  Language: Fair  Akathisia:  No  Handed:  Right  AIMS (if indicated):     Assets:  Communication Skills Desire for Improvement Physical Health  Sleep:  Number of Hours: 6.75    Musculoskeletal: Strength & Muscle Tone: within normal limits Gait & Station: normal Patient leans: N/A   Mental Status Per Nursing Assessment::   On Admission:     Current Mental Status by Physician: patient denies suicidal ideation, intent or plan  Loss Factors: Financial problems/change in socioeconomic status  Historical Factors: NA  Risk  Reduction Factors:   Sense of responsibility to family, Living with another person, especially a relative and Positive social support  Continued Clinical Symptoms: Alcohol dependence, resolving mood symptoms  Cognitive Features That Contribute To Risk:  Closed-mindedness Polarized thinking    Suicide Risk:  Minimal: No identifiable suicidal ideation.  Patients presenting with no risk factors but with morbid ruminations; may be classified as minimal risk based on the severity of the depressive symptoms  Discharge Diagnoses:   AXIS I:  Bipolar I disorder, most recent episode mixed              Anxiety disorder, NOS              Alcohol dependence AXIS II:  Cluster B Traits AXIS III:   Past Medical History  Diagnosis Date  . Stroke    AXIS IV:  other psychosocial or environmental problems and problems related to social environment AXIS V:  61-70 mild symptoms  Plan Of Care/Follow-up recommendations:  Activity:  as tolerated Diet:  healthy Tests:  routine Other:  patient to keep his after care appointment  Is patient on multiple antipsychotic therapies at discharge:  No   Has Patient had three or more failed trials of antipsychotic monotherapy by history:  No  Recommended Plan for Multiple Antipsychotic Therapies: NA    Thedore MinsAkintayo, Makyra Corprew, MD 09/05/2013, 9:29 AM

## 2013-09-05 NOTE — [facility] Suicide Risk Assessment (Signed)
[facility] INPATIENT:  Family/Significant Other Suicide Prevention Education  Suicide Prevention Education:  Education Completed; Gweneth Dimitrilizabeth Brannen, mother, [phone]  has been identified by the patient as the family member/significant other with whom the patient will be residing, and identified as the person(s) who will aid the patient in the event of a mental health crisis (suicidal ideations/suicide attempt).  With written consent from the patient, the family member/significant other has been provided the following suicide prevention education, prior to the and/or following the discharge of the patient.  The suicide prevention education provided includes the following:  Suicide risk factors  Suicide prevention and interventions  National Suicide Hotline telephone number  [facility] assessment telephone number  [facility] Assistance 911  [facility] and/or [facility]idential [facility] telephone number  Request made of family/significant other to:  Remove weapons (e.g., guns, rifles, knives), all items previously/currently identified as safety concern.    Remove drugs/medications (over-the-counter, prescriptions, illicit drugs), all items previously/currently identified as a safety concern.  The family member/significant other verbalizes understanding of the suicide prevention education information provided.  The family member/significant other agrees to remove the items of safety concern listed above.  Daryel Geraldorth, Lavontae Cornia B 09/05/2013, 10:58 AM

## 2013-09-05 NOTE — Tx Team (Signed)
  Interdisciplinary Treatment Plan Update   Date Reviewed:  09/05/2013  Time Reviewed:  8:26 AM  Progress in Treatment:   Attending groups: Yes Participating in groups: Yes Taking medication as prescribed: Yes  Tolerating medication: Yes Family/Significant other contact made: Yes  Patient understands diagnosis: Yes  Discussing patient identified problems/goals with staff: Yes Medical problems stabilized or resolved: Yes Denies suicidal/homicidal ideation: Yes Patient has not harmed self or others: Yes  For review of initial/current patient goals, please see plan of care.  Estimated Length of Stay:  D/C today  Reason for Continuation of Hospitalization:   New Problems/Goals identified:  N/A  Discharge Plan or Barriers:   return home, follow up outpt  Additional Comments:  Attendees:  Signature: Thedore MinsMojeed Akintayo, MD 09/05/2013 8:26 AM   Signature: Richelle Itood Joh Rao, LCSW 09/05/2013 8:26 AM  Signature: Fransisca KaufmannLaura Davis, NP 09/05/2013 8:26 AM  Signature: Joslyn Devonaroline Beaudry, RN 09/05/2013 8:26 AM  Signature: Liborio NixonPatrice White, RN 09/05/2013 8:26 AM  Signature:  09/05/2013 8:26 AM  Signature:   09/05/2013 8:26 AM  Signature:    Signature:    Signature:    Signature:    Signature:    Signature:      Scribe for Treatment Team:   Richelle Itood Keera Altidor, LCSW  09/05/2013 8:26 AM

## 2013-09-05 NOTE — Progress Notes (Signed)
Adult Psychoeducational Group Note  Date:  09/05/2013 Time:  10:29 AM  Group Topic/Focus:  Personal Choices and Values:   The focus of this group is to help patients assess and explore the importance of values in their lives, how their values affect their decisions, how they express their values and what opposes their expression.  Participation Level:  None  Participation Quality:  Appropriate  Affect:  Appropriate  Cognitive:  Appropriate  Insight: Appropriate  Engagement in Group:  None  Modes of Intervention:  Discussion  Additional Comments:  Pt attended group this morning. Pt did not participate in group.   Chundra Sauerwein A 09/05/2013, 10:29 AM

## 2013-09-05 NOTE — Discharge Summary (Signed)
Physician Discharge Summary Note  Patient:  [name] is an 43 y.o., male MRN:  [id number] DOB:  10-25-1970 Patient phone:  [phone] (home)  Patient address:   [place] [address],  Total Time spent with patient: 20 minutes  Date of Admission:  08/29/2013 Date of Discharge: 09/05/13  Reason for Admission:  Alcohol abuse, Psychosis   Discharge Diagnoses: Principal Problem:   Bipolar I disorder, most recent episode mixed Active Problems:   Alcohol-induced mood disorder   Bipolar 1 disorder   Anxiety state, unspecified   Alcohol dependence   Psychiatric Specialty Exam: Physical Exam  Review of Systems  Constitutional: Negative.   HENT: Negative.   Eyes: Negative.   Respiratory: Negative.   Cardiovascular: Negative.   Gastrointestinal: Negative.   Genitourinary: Negative.   Musculoskeletal: Negative.   Skin: Negative.   Neurological: Negative.   Endo/Heme/Allergies: Negative.   Psychiatric/Behavioral: Negative.     Blood pressure 98/74, pulse 97, temperature 96.8 F (36 C), temperature source Oral, resp. rate 18, height '5\' 9"'  (1.753 m), weight 74.39 kg (164 lb), SpO2 100.00%.Body mass index is 24.21 kg/(m^2).  See Physician SRA                                                  Past Psychiatric History: See H&P Diagnosis:  Hospitalizations:  Outpatient Care:  Substance Abuse Care:  Self-Mutilation:  Suicidal Attempts:  Violent Behaviors:   Musculoskeletal: Strength & Muscle Tone: within normal limits Gait & Station: normal Patient leans: N/A  DSM5:  AXIS I: Bipolar I disorder, most recent episode mixed  Anxiety disorder, NOS  Alcohol dependence  AXIS II: Cluster B Traits  AXIS III:  Past Medical History   Diagnosis  Date   .  Stroke    AXIS IV: other psychosocial or environmental problems and problems related to social environment  AXIS V: 61-70 mild symptoms  Level of Care:  OP  Hospital Course:  [name] is a 43 year old male who was brought to [facility] voluntarily via EMS after telling his family that he wanted to kill himself. The patient was just released from [facility] in [place], [place] on 07/28/13 after receiving treatment for alcohol abuse. The patient relapsed right after being discharged. He was drinking heavily before being admitted again to the hospital. [name] was having thoughts of shooting himself but did not have access. The patient is currently homeless stay with mother at different intervals. Patient states today during his psychiatric assessment "My mother just freaked out. I just got out of the hospital. I drank about family issues. I was emotional about the past. But I don't really want to discuss that. I get Disability for Bipolar. I have been on that since 2005." [name] appears very disheveled during the assessment. He was very irritable this morning with nursing staff as he refused all his medications. The patient later inquired how long he would need to stay in this facility. [name] did not appear to be fully invested in his treatment.          [name] was admitted to the adult 400 unit. He was evaluated and his symptoms were identified. Medication management was discussed and initiated. His Invega 3 mg daily for psychosis. He was started on Prozac 20 mg daily for depression. He was also  started on Neurontin 200 mg TID for improved mood stability and anxiety. Patient completed an Ativan taper for alcohol withdrawal.  He was oriented to the unit and encouraged to participate in unit programming. Medical problems were identified and treated appropriately. Home medication was restarted as needed.        The patient was evaluated each day by a clinical provider to ascertain the patient's response to treatment.  Improvement was noted by the patient's report of decreasing symptoms, improved sleep and appetite, affect, medication tolerance, behavior, and participation in unit  programming.  He was asked each day to complete a self inventory noting mood, mental status, pain, new symptoms, anxiety and concerns.         He responded well to medication and being in a therapeutic and supportive environment. Positive and appropriate behavior was noted and the patient was motivated for recovery. Patient was open in talking about his urge to drink beer with his birthday approaching. [name] was encouraged to make wise choices to safeguard his mental health. He was reminded that he was recently discharged from a facility then immediately relapsed. The patient worked closely with the treatment team and case manager to develop a discharge plan with appropriate goals. Coping skills, problem solving as well as relaxation therapies were also part of the unit programming.         By the day of discharge he was in much improved condition than upon admission.  Symptoms were reported as significantly decreased or resolved completely.  The patient denied SI/HI and voiced no AVH. He was motivated to continue taking medication with a goal of continued improvement in mental health.  [name] was discharged home with a plan to follow up as noted below. He was provided with prescriptions and sample medications.   Consults:  psychiatry  Significant Diagnostic Studies:  Chemistry, CBC, UDS  Discharge Vitals:   Blood pressure 98/74, pulse 97, temperature 96.8 F (36 C), temperature source Oral, resp. rate 18, height '5\' 9"'  (1.753 m), weight 74.39 kg (164 lb), SpO2 100.00%. Body mass index is 24.21 kg/(m^2). Lab Results:   Results for orders placed during the hospital encounter of 08/29/13 (from the past 72 hour(s))  COMPREHENSIVE METABOLIC PANEL     Status: Abnormal   Collection Time    09/02/13  7:30 PM      Result Value Ref Range   Sodium 140  137 - 147 mEq/L   Potassium 3.9  3.7 - 5.3 mEq/L   Chloride 100  96 - 112 mEq/L   CO2 28  19 - 32 mEq/L   Glucose, Bld 110 (*) 70 - 99 mg/dL    BUN 13  6 - 23 mg/dL   Creatinine, Ser 0.94  0.50 - 1.35 mg/dL   Calcium 9.5  8.4 - 10.5 mg/dL   Total Protein 7.1  6.0 - 8.3 g/dL   Albumin 3.6  3.5 - 5.2 g/dL   AST 36  0 - 37 U/L   ALT 68 (*) 0 - 53 U/L   Alkaline Phosphatase 94  39 - 117 U/L   Total Bilirubin 0.2 (*) 0.3 - 1.2 mg/dL   GFR calc non Af Amer >90  >90 mL/min   GFR calc Af Amer >90  >90 mL/min   Comment: (NOTE)     The eGFR has been calculated using the CKD EPI equation.     This calculation has not been validated in all clinical situations.     eGFR's persistently <  90 mL/min signify possible Chronic Kidney     Disease.   Anion gap 12  5 - 15   Comment: Performed at [facility]    Physical Findings: AIMS:  , ,  ,  ,    CIWA:  CIWA-Ar Total: 0 COWS:     Psychiatric Specialty Exam: See Psychiatric Specialty Exam and Suicide Risk Assessment completed by Attending Physician prior to discharge.  Discharge destination:  Home  Is patient on multiple antipsychotic therapies at discharge:  No   Has Patient had three or more failed trials of antipsychotic monotherapy by history:  No  Recommended Plan for Multiple Antipsychotic Therapies: NA     Medication List       Indication   FLUoxetine 20 MG capsule  Commonly known as:  PROZAC  Take 1 capsule (20 mg total) by mouth daily.   Indication:  Excessive Use of Alcohol, Depression     gabapentin 100 MG capsule  Commonly known as:  NEURONTIN  Take 2 capsules (200 mg total) by mouth 3 (three) times daily.   Indication:  Aggressive Behavior, Alcohol Withdrawal Syndrome     multivitamin with minerals Tabs tablet  Take 1 tablet by mouth daily.   Indication:  Vitamin Supplementation     paliperidone 3 MG 24 hr tablet  Commonly known as:  INVEGA  Take 1 tablet (3 mg total) by mouth daily.   Indication:  Bipolar mixed     traZODone 100 MG tablet  Commonly known as:  DESYREL  Take 1 tablet (100 mg total) by mouth at bedtime.   Indication:   Alcohol Withdrawal Syndrome, Excessive Use of Alcohol, Trouble Sleeping         Follow-up recommendations:   Activity: as tolerated  Diet: healthy  Tests: routine  Other: patient to keep his after care appointment   Comments:   Take all your medications as prescribed by your mental healthcare provider.  Report any adverse effects and or reactions from your medicines to your outpatient provider promptly.  Patient is instructed and cautioned to not engage in alcohol and or illegal drug use while on prescription medicines.  In the event of worsening symptoms, patient is instructed to call the crisis hotline, 911 and or go to the nearest ED for appropriate evaluation and treatment of symptoms.  Follow-up with your primary care provider for your other medical issues, concerns and or health care needs.   Total Discharge Time:  Greater than 30 minutes.  SignedElmarie Shiley NP-C 09/05/2013, 9:28 AM  Patient seen, evaluated and I agree with notes by Nurse Practitioner. Corena Pilgrim, MD

## 2013-09-05 NOTE — Progress Notes (Signed)
[facility] Dis[facility] :  Will you be returning to the same living situation after discharge: Yes,  home At discharge, do you have transportation home?:Yes,  ACT team Do you have the ability to pay for your medications:Yes,  MCD  Release of information consent forms completed and in the chart;  Patient's signature needed at discharge.  Patient to Follow up at: Follow-up Information   Follow up with [facility] On 09/06/2013. Judeth Cornfield(Stephanie will give you a ride home today.)    Contact information:   [address]  [place]  [phone]      Patient denies SI/HI:   Yes,  yes    Safety Planning and Suicide Prevention discussed:  Yes,  yes  Ida Rogueorth, Taina Landry B 09/05/2013, 10:49 AM

## 2013-09-07 NOTE — Progress Notes (Signed)
Patient Discharge Instructions:  After Visit Summary (AVS):   Faxed to:  09/07/13 Discharge Summary Note:   Faxed to:  09/07/13 Psychiatric Admission Assessment Note:   Faxed to:  09/07/13 Suicide Risk Assessment - Discharge Assessment:   Faxed to:  09/07/13 Faxed/Sent to the Next Level Care provider:  09/07/13 Faxed to [facility] @ [phone]  Jerelene ReddenSheena E Herrings, 09/07/2013, 3:49 PM

## 2014-04-07 ENCOUNTER — Inpatient Hospital Stay (HOSPITAL_COMMUNITY)
Admission: AD | Admit: 2014-04-07 | Discharge: 2014-04-09 | DRG: 885 | Disposition: A | Discharge status: Home / Self Care | Payer: 59 | Source: Intra-hospital | Attending: Psychiatry | Admitting: Psychiatry

## 2014-04-07 ENCOUNTER — Encounter (HOSPITAL_COMMUNITY): Payer: Self-pay | Admitting: *Deleted

## 2014-04-07 ENCOUNTER — Emergency Department (HOSPITAL_COMMUNITY)
Admission: EM | Admit: 2014-04-07 | Discharge: 2014-04-07 | Disposition: A | Discharge status: Other Acute Inpatient Hospital | Payer: Medicaid Other | Attending: Emergency Medicine | Admitting: Emergency Medicine

## 2014-04-07 DIAGNOSIS — F1420 Cocaine dependence, uncomplicated: Secondary | ICD-10-CM | POA: Diagnosis present

## 2014-04-07 DIAGNOSIS — Z8249 Family history of ischemic heart disease and other diseases of the circulatory system: Secondary | ICD-10-CM

## 2014-04-07 DIAGNOSIS — G4700 Insomnia, unspecified: Secondary | ICD-10-CM | POA: Diagnosis present

## 2014-04-07 DIAGNOSIS — F411 Generalized anxiety disorder: Secondary | ICD-10-CM | POA: Diagnosis present

## 2014-04-07 DIAGNOSIS — F314 Bipolar disorder, current episode depressed, severe, without psychotic features: Secondary | ICD-10-CM | POA: Diagnosis present

## 2014-04-07 DIAGNOSIS — F1024 Alcohol dependence with alcohol-induced mood disorder: Secondary | ICD-10-CM | POA: Insufficient documentation

## 2014-04-07 DIAGNOSIS — F17210 Nicotine dependence, cigarettes, uncomplicated: Secondary | ICD-10-CM | POA: Diagnosis present

## 2014-04-07 DIAGNOSIS — Z720 Tobacco use: Secondary | ICD-10-CM | POA: Insufficient documentation

## 2014-04-07 DIAGNOSIS — F1414 Cocaine abuse with cocaine-induced mood disorder: Secondary | ICD-10-CM | POA: Diagnosis present

## 2014-04-07 DIAGNOSIS — F10230 Alcohol dependence with withdrawal, uncomplicated: Secondary | ICD-10-CM

## 2014-04-07 DIAGNOSIS — Z79899 Other long term (current) drug therapy: Secondary | ICD-10-CM | POA: Diagnosis not present

## 2014-04-07 DIAGNOSIS — F10239 Alcohol dependence with withdrawal, unspecified: Secondary | ICD-10-CM | POA: Diagnosis present

## 2014-04-07 DIAGNOSIS — Z8673 Personal history of transient ischemic attack (TIA), and cerebral infarction without residual deficits: Secondary | ICD-10-CM

## 2014-04-07 DIAGNOSIS — R45851 Suicidal ideations: Secondary | ICD-10-CM

## 2014-04-07 DIAGNOSIS — F1010 Alcohol abuse, uncomplicated: Secondary | ICD-10-CM | POA: Diagnosis present

## 2014-04-07 LAB — RAPID URINE DRUG SCREEN, HOSP PERFORMED
AMPHETAMINES: NOT DETECTED
BARBITURATES: NOT DETECTED
Benzodiazepines: NOT DETECTED
Cocaine: POSITIVE — AB
Opiates: NOT DETECTED
Tetrahydrocannabinol: NOT DETECTED

## 2014-04-07 LAB — CBC
HCT: 47.6 % (ref 39.0–52.0)
HEMOGLOBIN: 16.7 g/dL (ref 13.0–17.0)
MCH: 33.1 pg (ref 26.0–34.0)
MCHC: 35.1 g/dL (ref 30.0–36.0)
MCV: 94.3 fL (ref 78.0–100.0)
Platelets: 266 10*3/uL (ref 150–400)
RBC: 5.05 MIL/uL (ref 4.22–5.81)
RDW: 12.8 % (ref 11.5–15.5)
WBC: 8.1 10*3/uL (ref 4.0–10.5)

## 2014-04-07 LAB — COMPREHENSIVE METABOLIC PANEL
ALBUMIN: 4.4 g/dL (ref 3.5–5.2)
ALT: 34 U/L (ref 0–53)
AST: 24 U/L (ref 0–37)
Alkaline Phosphatase: 76 U/L (ref 39–117)
Anion gap: 10 (ref 5–15)
BUN: 8 mg/dL (ref 6–23)
CALCIUM: 9.4 mg/dL (ref 8.4–10.5)
CO2: 24 mmol/L (ref 19–32)
CREATININE: 0.82 mg/dL (ref 0.50–1.35)
Chloride: 104 mmol/L (ref 96–112)
GFR calc Af Amer: >90 mL/min (ref >90)
GFR calc non Af Amer: >90 mL/min (ref >90)
Glucose, Bld: 115 mg/dL — ABNORMAL HIGH (ref 70–99)
POTASSIUM: 3.9 mmol/L (ref 3.5–5.1)
Sodium: 138 mmol/L (ref 135–145)
Total Bilirubin: 1 mg/dL (ref 0.3–1.2)
Total Protein: 7.9 g/dL (ref 6.0–8.3)

## 2014-04-07 LAB — ACETAMINOPHEN LEVEL

## 2014-04-07 LAB — SALICYLATE LEVEL

## 2014-04-07 LAB — ETHANOL

## 2014-04-07 MED ORDER — VITAMIN B-1 100 MG PO TABS
100.0000 mg | ORAL_TABLET | Freq: Every day | ORAL | Status: DC
  Administered 2014-04-07 – 2014-04-09 (×3): 100 mg via ORAL
  Filled 2014-04-07 (×4): qty 1

## 2014-04-07 MED ORDER — LORAZEPAM 1 MG PO TABS: 1.0000 mg | ORAL_TABLET | Freq: Four times a day (QID) | ORAL | Status: DC | PRN

## 2014-04-07 MED ORDER — FLUOXETINE HCL 20 MG PO CAPS
20.0000 mg | ORAL_CAPSULE | Freq: Every day | ORAL | Status: DC
  Administered 2014-04-07 – 2014-04-09 (×3): 20 mg via ORAL
  Filled 2014-04-07 (×3): qty 1
  Filled 2014-04-07: qty 3
  Filled 2014-04-07: qty 1

## 2014-04-07 MED ORDER — NICOTINE 21 MG/24HR TD PT24
21.0000 mg | MEDICATED_PATCH | Freq: Every day | TRANSDERMAL | Status: DC
  Filled 2014-04-07: qty 1

## 2014-04-07 MED ORDER — ONDANSETRON 4 MG PO TBDP: 4.0000 mg | ORAL_TABLET | Freq: Four times a day (QID) | ORAL | Status: DC | PRN

## 2014-04-07 MED ORDER — LORAZEPAM 1 MG PO TABS
1.0000 mg | ORAL_TABLET | Freq: Three times a day (TID) | ORAL | Status: AC
  Administered 2014-04-08 – 2014-04-09 (×3): 1 mg via ORAL
  Filled 2014-04-07 (×3): qty 1

## 2014-04-07 MED ORDER — ALUM & MAG HYDROXIDE-SIMETH 200-200-20 MG/5ML PO SUSP: 30.0000 mL | ORAL | Status: DC | PRN

## 2014-04-07 MED ORDER — TRAZODONE HCL 100 MG PO TABS
100.0000 mg | ORAL_TABLET | Freq: Every day | ORAL | Status: DC
  Administered 2014-04-08: 100 mg via ORAL
  Filled 2014-04-07 (×4): qty 1
  Filled 2014-04-07: qty 3

## 2014-04-07 MED ORDER — LORAZEPAM 1 MG PO TABS
1.0000 mg | ORAL_TABLET | Freq: Four times a day (QID) | ORAL | Status: AC
  Administered 2014-04-07 – 2014-04-08 (×3): 1 mg via ORAL
  Filled 2014-04-07 (×3): qty 1

## 2014-04-07 MED ORDER — LOPERAMIDE HCL 2 MG PO CAPS: 2.0000 mg | ORAL_CAPSULE | ORAL | Status: DC | PRN

## 2014-04-07 MED ORDER — FLUOXETINE HCL 20 MG PO CAPS
20.0000 mg | ORAL_CAPSULE | Freq: Every day | ORAL | Status: DC
  Administered 2014-04-07: 20 mg via ORAL
  Filled 2014-04-07: qty 1

## 2014-04-07 MED ORDER — ONDANSETRON HCL 4 MG PO TABS
4.0000 mg | ORAL_TABLET | Freq: Three times a day (TID) | ORAL | Status: DC | PRN
Start: 2014-04-07 — End: 2014-04-07

## 2014-04-07 MED ORDER — ALBUTEROL SULFATE HFA 108 (90 BASE) MCG/ACT IN AERS: 2.0000 | INHALATION_SPRAY | Freq: Four times a day (QID) | RESPIRATORY_TRACT | Status: DC | PRN

## 2014-04-07 MED ORDER — ARIPIPRAZOLE 9.75 MG/1.3ML IM SOLN
5.2500 mg | INTRAMUSCULAR | Status: DC
  Administered 2014-04-07: 5.25 mg via INTRAMUSCULAR
  Filled 2014-04-07: qty 1.3
  Filled 2014-04-07: qty 0.7

## 2014-04-07 MED ORDER — GABAPENTIN 100 MG PO CAPS
200.0000 mg | ORAL_CAPSULE | Freq: Three times a day (TID) | ORAL | Status: DC
  Administered 2014-04-07 – 2014-04-09 (×8): 200 mg via ORAL
  Filled 2014-04-07 (×5): qty 2
  Filled 2014-04-07: qty 18
  Filled 2014-04-07 (×3): qty 2
  Filled 2014-04-07 (×2): qty 18
  Filled 2014-04-07: qty 2

## 2014-04-07 MED ORDER — LORAZEPAM 1 MG PO TABS
1.0000 mg | ORAL_TABLET | Freq: Two times a day (BID) | ORAL | Status: DC
  Administered 2014-04-09: 1 mg via ORAL
  Filled 2014-04-07: qty 1

## 2014-04-07 MED ORDER — PALIPERIDONE ER 3 MG PO TB24
3.0000 mg | ORAL_TABLET | Freq: Every day | ORAL | Status: DC
  Administered 2014-04-07: 3 mg via ORAL
  Filled 2014-04-07: qty 1

## 2014-04-07 MED ORDER — MAGNESIUM HYDROXIDE 400 MG/5ML PO SUSP: 30.0000 mL | Freq: Every day | ORAL | Status: DC | PRN

## 2014-04-07 MED ORDER — TRAZODONE HCL 100 MG PO TABS: 100.0000 mg | ORAL_TABLET | Freq: Every day | ORAL | Status: DC

## 2014-04-07 MED ORDER — GABAPENTIN 100 MG PO CAPS
200.0000 mg | ORAL_CAPSULE | Freq: Three times a day (TID) | ORAL | Status: DC
  Administered 2014-04-07: 200 mg via ORAL
  Filled 2014-04-07: qty 2

## 2014-04-07 MED ORDER — THIAMINE HCL 100 MG/ML IJ SOLN: 100.0000 mg | Freq: Once | INTRAMUSCULAR | Status: DC

## 2014-04-07 MED ORDER — LAMOTRIGINE 25 MG PO TABS
25.0000 mg | ORAL_TABLET | Freq: Every day | ORAL | Status: DC
  Administered 2014-04-07 – 2014-04-09 (×3): 25 mg via ORAL
  Filled 2014-04-07 (×4): qty 1
  Filled 2014-04-07: qty 3

## 2014-04-07 MED ORDER — ACETAMINOPHEN 325 MG PO TABS
650.0000 mg | ORAL_TABLET | ORAL | Status: DC | PRN
  Administered 2014-04-07: 650 mg via ORAL
  Filled 2014-04-07: qty 2

## 2014-04-07 MED ORDER — ADULT MULTIVITAMIN W/MINERALS CH
1.0000 | ORAL_TABLET | Freq: Every day | ORAL | Status: DC
  Administered 2014-04-07 – 2014-04-09 (×3): 1 via ORAL
  Filled 2014-04-07: qty 3
  Filled 2014-04-07 (×4): qty 1

## 2014-04-07 MED ORDER — LORAZEPAM 1 MG PO TABS: 1.0000 mg | ORAL_TABLET | Freq: Every day | ORAL | Status: DC

## 2014-04-07 MED ORDER — ADULT MULTIVITAMIN W/MINERALS CH
1.0000 | ORAL_TABLET | Freq: Every day | ORAL | Status: DC
  Filled 2014-04-07 (×3): qty 1

## 2014-04-07 MED ORDER — ALBUTEROL SULFATE HFA 108 (90 BASE) MCG/ACT IN AERS
2.0000 | INHALATION_SPRAY | Freq: Four times a day (QID) | RESPIRATORY_TRACT | Status: DC | PRN
  Administered 2014-04-09: 2 via RESPIRATORY_TRACT
  Filled 2014-04-07: qty 6.7

## 2014-04-07 MED ORDER — VENLAFAXINE HCL ER 37.5 MG PO CP24
37.5000 mg | ORAL_CAPSULE | Freq: Every day | ORAL | Status: DC
  Filled 2014-04-07 (×2): qty 1

## 2014-04-07 MED ORDER — HYDROXYZINE HCL 25 MG PO TABS
25.0000 mg | ORAL_TABLET | Freq: Four times a day (QID) | ORAL | Status: DC | PRN
  Administered 2014-04-09: 25 mg via ORAL
  Filled 2014-04-07 (×2): qty 1

## 2014-04-07 NOTE — Progress Notes (Signed)
D)  Has been sleeping tonight since the beginning of the shift, eyes closed, resp reg, unlabored, no c/o's voiced. \A)  Will continue to monitor q 15 minutes for safety, continue POC, medications as ordered, R)  Safety maintained.

## 2014-04-07 NOTE — ED Notes (Signed)
Pt is awake and alert, pleasant and cooperative. Reports Si thoughts, denies pass attemots Discharge vitals 114/68 HR 81 RR 16 and unlabored. Pt has intpatient treatment scheduled. Will continue to monitor for safety. [facility] without incident. T.Melvyn NethLewis RN

## 2014-04-07 NOTE — BH Assessment (Signed)
Assessment Note  [name] is an 44 y.o. male., Patient reports calling an ambulance [place] [employer] [facility]  He reports experiencing current suicidal ideation with a plan to OD on crack cocaine.  He reports experiencing SI with this plan for the past 3 days.  Patient denied current and past homicidal ideations, plans, or intent or AVH.  Patient reports smoking 1 gram of crack last night.  Patient also reports consuming a 6 pack of beer last night and he drinks daily.  He reports also smoking a Cannabis blunt a month ago.  Patient reports having a mental health diagnosis of Bipolar and being prescribed 3 psychotopic  medications that he has not taken in 3 days.  He reports having an Investment banker, operationalACTT Team and his Case Manager is "Judeth CornfieldStephanie."  Patient reports currently experiencing problems with his Mother, whom he resides with.  He reports also struggling with other personal problems he would not identify.  Patient reports currently experiencing withdrawal symptoms from the cocaine, and symptoms of depression and anxiety.  He reports receiving 8 hours of sleep per night and having a good appetite.  The Patient reports he would like to detox from alcohol and cocaine and then return to his Mother's home.      Axis I: Mood Disorder NOS and Cocaine Use Disorder, moderate Axis II: Deferred Axis IV: economic problems, housing problems and problems with primary support group Axis V: 11-20 some danger of hurting self or others possible OR occasionally fails to maintain minimal personal hygiene OR gross impairment in communication  Past Medical History:  Past Medical History  Diagnosis Date  . Bipolar 1 disorder   . Anxiety   . Stroke     Past Surgical History  Procedure Laterality Date  . Finger surgery    . Cholecystectomy N/A 12/24/2012    Procedure: LAPAROSCOPIC CHOLECYSTECTOMY;  Surgeon: Cherylynn RidgesJames O Wyatt, MD;  [facility];  Service: General;  Laterality: N/A;    Family History:  Family  History  Problem Relation Age of Onset  . Heart disease Mother     Social History:  reports that he has been smoking Cigarettes.  He has a 5 pack-year smoking history. He does not have any smokeless tobacco history on file. He reports that he drinks about 12.0 oz of alcohol per week. He reports that he does not use illicit drugs.  Additional Social History:     CIWA: CIWA-Ar BP: 118/78 mmHg Pulse Rate: 92 COWS:    Allergies: No Known Allergies  Home Medications:  (Not in a hospital admission)  OB/GYN Status:  No LMP for male patient.  General Assessment Data Location of Assessment: [facility] ED ACT Assessment: Yes Is this a Tele or Face-to-Face Assessment?: Face-to-Face Is this an Initial Assessment or a Re-assessment for this encounter?: Initial Assessment Living Arrangements: Parent (Lives with Mother) Can pt return to current living arrangement?: Yes Admission Status: Voluntary Is patient capable of signing voluntary admission?: Yes Transfer from: Other (Comment) ([employer]) Referral Source: Self/Family/Friend  Medical Screening Exam [facility] Walk[facility]in ONLY) Medical Exam completed: Yes  [facility] Living Arrangements: Parent (Lives with Mother) Name of Psychiatrist: None Name of Therapist: Judeth CornfieldStephanie ACTT Team case Designer, multimediaManager ([facility])  Education Status Is patient currently in school?: No Current Grade: N/A Highest grade of school patient has completed: N/A Name of school: N/A Contact person: N/A  Risk to self with the past 6 months Suicidal Ideation: Yes-Currently Present Suicidal  Intent: Yes-Currently Present Is patient at risk for suicide?: No Suicidal Plan?: Yes-Currently Present Specify Current Suicidal Plan: Patient reports he will OD on cocaine Access to Means: No What has been your use of drugs/alcohol within the last 12 months?: Cocaine and alcohol Previous Attempts/Gestures: Yes How many times?: 1 Other Self Harm Risks:  No Triggers for Past Attempts: Other (Comment) ("personal problem"  would not give details) Intentional Self Injurious Behavior: None Family Suicide History: No Recent stressful life event(s): Other (Comment) (Realtional distress with Mother) Persecutory voices/beliefs?: No Depression: Yes Depression Symptoms: Feeling angry/irritable, Loss of interest in usual pleasures Substance abuse history and/or treatment for substance abuse?: Yes Suicide prevention information given to non-admitted patients: Not applicable  Risk to Others within the past 6 months Homicidal Ideation: No Thoughts of Harm to Others: No Current Homicidal Intent: No Current Homicidal Plan: No Access to Homicidal Means: No Identified Victim: N/A History of harm to others?: No Assessment of Violence: None Noted Violent Behavior Description: None Does patient have access to weapons?: No Criminal Charges Pending?: No (Patient denied) Does patient have a court date: No  Psychosis Hallucinations: None noted Delusions: None noted  Mental Status Report Appear/Hygiene: In hospital gown Eye Contact: Fair Motor Activity: Restlessness Speech: Logical/coherent Level of Consciousness: Alert Mood: Anxious, Depressed Affect: Anxious, Depressed Anxiety Level: Moderate Thought Processes: Coherent Judgement: Impaired Obsessive Compulsive Thoughts/Behaviors: None  Cognitive Functioning Memory: Recent Intact, Remote Intact IQ: Average Impulse Control: Poor Appetite: Good Weight Loss: 0 Weight Gain: 0 Sleep: No Change Total Hours of Sleep: 0 Vegetative Symptoms: None  ADLScreening [facility] Assessment Services) Patient's cognitive ability adequate to safely complete daily activities?: No Patient able to express need for assistance with ADLs?: Yes Independently performs ADLs?: Yes (appropriate for developmental age)  Prior Inpatient Therapy Prior Inpatient Therapy: Yes Prior Therapy Dates: July 2015 Prior Therapy  Facilty/Provider(s): [facility] Reason for Treatment: Bipolar and SI  Prior Outpatient Therapy Prior Outpatient Therapy: Yes Prior Therapy Dates: Current Prior Therapy Facilty/Provider(s): [facility] (ACTT Team Case Management) Reason for Treatment: Bipolar  ADL Screening (condition at time of admission) Patient's cognitive ability adequate to safely complete daily activities?: No Is the patient deaf or have difficulty hearing?: No Does the patient have difficulty seeing, even when wearing glasses/contacts?: No Does the patient have difficulty concentrating, remembering, or making decisions?: No Patient able to express need for assistance with ADLs?: Yes Does the patient have difficulty dressing or bathing?: No Independently performs ADLs?: Yes (appropriate for developmental age) Does the patient have difficulty walking or climbing stairs?: No Weakness of Legs: None Weakness of Arms/Hands: None  Home Assistive Devices/Equipment Home Assistive Devices/Equipment: None    Abuse/Neglect Assessment (Assessment to be complete while patient is alone) Physical Abuse: Denies Verbal Abuse: Denies Sexual Abuse: Denies Exploitation of patient/patient's resources: Denies Self-Neglect: Denies Values / Beliefs Cultural Requests During Hospitalization: None Spiritual Requests During Hospitalization: None   Advance Directives (For Healthcare) Does patient have an advance directive?: No Would patient like information on creating an advanced directive?: No - patient declined information    Additional Information 1:1 In Past 12 Months?: No CIRT Risk: No Elopement Risk: No Does patient have medical clearance?: Yes     Disposition:  Disposition Initial Assessment Completed for this Encounter: Yes Disposition of Patient: Inpatient treatment program Type of inpatient treatment program: Adult [facility])  On Site Evaluation by:   Reviewed with Physician:     Dey-Johnson,Rane Dumm 04/07/2014 5:47 AM

## 2014-04-07 NOTE — ED Notes (Signed)
Pt was outside [place] a bridge and called EMS for request for Detox from crack cocaine - pt states that he recently relapsed and used again today; pt states that previously he was clean since 2009; pt also c/o suicidal ideations with a plan to overdose

## 2014-04-07 NOTE — ED Provider Notes (Signed)
CSN: [id number]     Arrival date & time 04/07/14  0255 History   First MD Initiated Contact with Patient 04/07/14 0328     Chief Complaint  Patient presents with  . Suicidal  . detoxification      (Consider location/radiation/quality/duration/timing/severity/associated sxs/prior Treatment) Patient is a 44 y.o. male presenting with mental health disorder. The history is provided by the patient. No language interpreter was used.  Mental Health Problem Presenting symptoms: depression and suicidal thoughts   Associated symptoms comment:  He reports suicidal ideations with plan to overdose on cocaine. He has a history of cocaine dependence and states he has not used since 2009 until tonight. He has been depressed over parents health concerns which he feels led to his relapse and now he is depressed and wants to kill himself. No homicidal ideation.   Past Medical History  Diagnosis Date  . Bipolar 1 disorder   . Anxiety   . Stroke    Past Surgical History  Procedure Laterality Date  . Finger surgery    . Cholecystectomy N/A 12/24/2012    Procedure: LAPAROSCOPIC CHOLECYSTECTOMY;  Surgeon: Cherylynn Ridges, MD;  Location: [facility] OR;  Service: General;  Laterality: N/A;   Family History  Problem Relation Age of Onset  . Heart disease Mother    History  Substance Use Topics  . Smoking status: Current Every Day Smoker -- 1.00 packs/day for 5 years    Types: Cigarettes  . Smokeless tobacco: Not on file  . Alcohol Use: 12.0 oz/week    20 Cans of beer per week     Comment: 3 24oz beers every other day    Review of Systems  Constitutional: Negative for fever and chills.  HENT: Negative.   Respiratory: Negative.   Cardiovascular: Negative.   Gastrointestinal: Negative.   Musculoskeletal: Negative.   Skin: Negative.   Neurological: Negative.   Psychiatric/Behavioral: Positive for suicidal ideas and dysphoric mood.      Allergies  Review of patient's allergies indicates no known  allergies.  Home Medications   Prior to Admission medications   Medication Sig Start Date End Date Taking? Authorizing Provider  albuterol (PROVENTIL HFA;VENTOLIN HFA) 108 (90 BASE) MCG/ACT inhaler Inhale 2 puffs into the lungs every 6 (six) hours as needed for wheezing or shortness of breath.   Yes Historical Provider, MD  ARIPiprazole (ABILIFY) 9.75 MG/1.3ML injection Inject 5.25 mg into the muscle every 30 (thirty) days.   Yes Historical Provider, MD  FLUoxetine (PROZAC) 20 MG capsule Take 1 capsule (20 mg total) by mouth daily. 09/05/13  Yes Fransisca Kaufmann, NP  gabapentin (NEURONTIN) 100 MG capsule Take 2 capsules (200 mg total) by mouth 3 (three) times daily. 09/05/13  Yes Fransisca Kaufmann, NP  Multiple Vitamin (MULTIVITAMIN WITH MINERALS) TABS tablet Take 1 tablet by mouth daily. Patient not taking: Reported on 04/07/2014 09/05/13   Fransisca Kaufmann, NP  paliperidone (INVEGA) 3 MG 24 hr tablet Take 1 tablet (3 mg total) by mouth daily. Patient not taking: Reported on 04/07/2014 09/05/13   Fransisca Kaufmann, NP  traZODone (DESYREL) 100 MG tablet Take 1 tablet (100 mg total) by mouth at bedtime. Patient not taking: Reported on 04/07/2014 09/05/13   Fransisca Kaufmann, NP   BP 118/78 mmHg  Pulse 92  Temp(Src) 98 F (36.7 C)  Resp 18  SpO2 96% Physical Exam  Constitutional: He appears well-developed and well-nourished.  HENT:  Head: Normocephalic.  Neck: Normal range of motion. Neck supple.  Cardiovascular: Normal rate and regular  rhythm.   Pulmonary/Chest: Effort normal and breath sounds normal.  Abdominal: Soft. Bowel sounds are normal. There is no tenderness. There is no rebound and no guarding.  Musculoskeletal: Normal range of motion.  Neurological: He is alert. No cranial nerve deficit.  Skin: Skin is warm and dry. No rash noted.  Psychiatric: He has a normal mood and affect.    ED Course  Procedures (including critical care time) Labs Review Labs Reviewed  ACETAMINOPHEN LEVEL - Abnormal; Notable for  the following:    Acetaminophen (Tylenol), Serum <10.0 (*)    All other components within normal limits  COMPREHENSIVE METABOLIC PANEL - Abnormal; Notable for the following:    Glucose, Bld 115 (*)    All other components within normal limits  URINE RAPID DRUG SCREEN (HOSP PERFORMED) - Abnormal; Notable for the following:    Cocaine POSITIVE (*)    All other components within normal limits  CBC  ETHANOL  SALICYLATE LEVEL    Imaging Review No results found.   EKG Interpretation None      MDM   Final diagnoses:  None    1. SI 2. Cocaine dependence  He continues to endorse suicidal thoughts. TTS consultation performed and he is found to meet inpatient criteria. Further there is a bed at [facility] after 8:00 a.m. this morning. He will be transferred at that time.     Arnoldo HookerShari A Sherma Vanmetre, PA-C 04/07/14 [id number]  Ward GivensIva L Knapp, MD 04/07/14 [phone]

## 2014-04-07 NOTE — BH Assessment (Signed)
0505:  Consulted with Corinna GabSharri Upstill, PA-C about Patient.  She reports Patient is reporting suicide with a plan to OD on cocaine.    0507:  Assessed Patient.  [id number]:  Consulted with Maryjean Mornharles Kober, PA.  Per Maryjean Mornharles Kober, PA;  [facility]  [facility]:  [facility],  attending Dr. Jama Flavorsobos.   0532:  Provide Patient disposition to Corinna GabSharri Upstill, PA-C.

## 2014-04-07 NOTE — Tx Team (Signed)
Initial Interdisciplinary Treatment Plan   PATIENT STRESSORS: Substance abuse   PATIENT STRENGTHS: Ability for insight Average or above average intelligence   PROBLEM LIST: Problem List/Patient Goals Date to be addressed Date deferred Reason deferred Estimated date of resolution  Substance abuse 04/07/14                                                      DISCHARGE CRITERIA:  Improved stabilization in mood, thinking, and/or behavior Need for constant or close observation no longer present  PRELIMINARY DISCHARGE PLAN: Return to previous living arrangement  PATIENT/FAMIILY INVOLVEMENT: This treatment plan has been presented to and reviewed with the patient, Rickey BarbaraRandy W Ferencz, and/or family member.  The patient and family have been given the opportunity to ask questions and make suggestions.  Jacquelyne BalintForrest, Garner Dullea Shanta 04/07/2014, 11:32 AM

## 2014-04-07 NOTE — H&P (Signed)
Psychiatric Admission Assessment Adult  Patient Identification: [name] MRN:  [id number]  Date of Evaluation:  04/07/2014  Chief Complaint:  MDD  Principal Diagnosis: <principal problem not specified>  Diagnosis:   Patient Active Problem List   Diagnosis Date Noted  . Alcohol dependence with uncomplicated withdrawal [K44.010] 04/07/2014  . Bipolar affective disorder, depressed, severe [F31.4] 04/07/2014  . Suicidal ideations [R45.851] 04/07/2014  . Alcohol abuse, episodic [F10.10] 04/07/2014  . Alcohol dependence with alcohol-induced mood disorder [F10.24]   . Bipolar I disorder, most recent episode mixed [F31.60] 08/31/2013  . Anxiety state, unspecified [F41.1] 08/31/2013  . Bipolar 1 disorder [F31.9] 08/30/2013  . 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 Diarrhea [R19.7] 01/30/2013  . Postop check [Z09] 01/30/2013  . Abdominal pain [R10.9] 12/18/2012  . Family history of cardiac disorder [Z82.49] 09/20/2012  . Excessive sweating, local [L74.519] 09/20/2012  . Compulsive tobacco user syndrome [F17.200] 09/20/2012   History of Present Illness: [name] is a 44 year old Caucasian male. Ad[facility] the [facility] with complaints of suicidal ideations. He reports, "The ambulance brought me to the hospital. I called them because I was feeling suicidal. I have bipolar disorder, recently relapsed on Cocaine/alcohol. My suicidal ideation started 4 days ago. I'm having some bad family issues. It is about my parents. Their health is going down. I got frustrated, stopped taking my medicines 3 days ago. I started drinking & drugging. I was drinking 6 packs of beer & a gram of crack daily x 4 days. I was using & drinking because I was stressed. I need drug & alcohol rehab after discharge. I'm still suicidal. I'm hearing this voice telling me to 'kill yourself'. That is why I attempted suicide by smoking a lot of crack. I had attempted suicide in the past by overdose. I'm  with the [facility]. They were at the house 3 days ago to give me my Abilify injection. I need to get back on all my medicines, I don't remember all their names".  Elements:  Location:  Alcohol/Cocaine dependence. Quality:  Suicidal ideations, auditory hallucinations. Severity:  Severe, "I tried to kill myself by smoking a lot of crack". Timing:  "My symptoms worsened 4 days ago". Duration:  Chronic mental illness.. Context:  "I got frustrated 4 days ago, relapsed on crack & alcohol, became suicidal"..  Associated Signs/Symptoms:  Depression Symptoms:  depressed mood, insomnia, feelings of worthlessness/guilt, suicidal thoughts without plan, anxiety,  (Hypo) Manic Symptoms:  Impulsivity,  Anxiety Symptoms:  Excessive Worry,  Psychotic Symptoms:  Hallucinations: Auditory  PTSD Symptoms: NA  Total Time spent with patient: 1 hour  Past Medical History:  Past Medical History  Diagnosis Date  . Bipolar 1 disorder   . Anxiety   . Stroke     Past Surgical History  Procedure Laterality Date  . Finger surgery    . Cholecystectomy N/A 12/24/2012    Procedure: LAPAROSCOPIC CHOLECYSTECTOMY;  Surgeon: Gwenyth Ober, MD;  Location: [place];  Service: General;  Laterality: N/A;   Family History:  Family History  Problem Relation Age of Onset  . Heart disease Mother    Social History:  History  Alcohol Use  . 12.0 oz/week  . 20 Cans of beer per week    Comment: 3 24oz beers every other day     History  Drug Use No    History   Social History  . Marital Status: Single    Spouse Name: N/A  .  Number of Children: N/A  . Years of Education: N/A   Social History Main Topics  . Smoking status: Current Every Day Smoker -- 1.00 packs/day for 5 years    Types: Cigarettes  . Smokeless tobacco: Not on file  . Alcohol Use: 12.0 oz/week    20 Cans of beer per week     Comment: 3 24oz beers every other day  . Drug Use: No  . Sexual Activity: No   Other  Topics Concern  . None   Social History Narrative   Additional Social History:    Pain Medications: none  Prescriptions: none Over the Counter: none History of alcohol / drug use?: Yes Negative Consequences of Use: Personal relationships, Legal  Musculoskeletal: Strength & Muscle Tone: within normal limits Gait & Station: normal Patient leans: N/A  Psychiatric Specialty Exam: Physical Exam  Constitutional: He is oriented to person, place, and time. He appears well-developed and well-nourished.  HENT:  Head: Normocephalic.  Eyes: Pupils are equal, round, and reactive to light.  Neck: Normal range of motion.  Cardiovascular: Normal rate.   Respiratory: Effort normal.  GI: Soft.  Genitourinary:  Denies any issues  Musculoskeletal: Normal range of motion.  Neurological: He is alert and oriented to person, place, and time.  Skin: Skin is warm and dry.  Psychiatric: His speech is normal. Judgment and thought content normal. His mood appears anxious. His affect is not angry, not blunt, not labile and not inappropriate. He is actively hallucinating. Cognition and memory are normal. He exhibits a depressed mood.    Review of Systems  Constitutional: Negative.   HENT: Negative.   Eyes: Negative.   Respiratory: Negative.   Cardiovascular: Negative.   Gastrointestinal: Negative.   Genitourinary: Negative.   Musculoskeletal: Negative.   Skin: Negative.   Neurological: Negative.   Endo/Heme/Allergies: Negative.   Psychiatric/Behavioral: Positive for depression, suicidal ideas, hallucinations and substance abuse (Alcohol/cocaine abuse). The patient is nervous/anxious and has insomnia.     There were no vitals taken for this visit.There is no weight on file to calculate BMI.  General Appearance: Disheveled  Eye Sport and exercise psychologist::  Fair  Speech:  Clear and Coherent  Volume:  Normal  Mood:  Anxious and Depressed  Affect:  Restricted  Thought Process:  Coherent and Goal Directed   Orientation:  Full (Time, Place, and Person)  Thought Content:  Hallucinations: Auditory and Rumination  Suicidal Thoughts:  Yes.  without intent/plan  Homicidal Thoughts:  No  Memory:  Immediate;   Fair Recent;   Fair Remote;   Fair  Judgement:  Other:  Limited  Insight:  Fair  Psychomotor Activity:  High anxiety  Concentration:  Fair  Recall:  [employer] of Knowledge:Poor  Language: Fair  Akathisia:  No  Handed:  Right  AIMS (if indicated):     Assets:  Desire for Improvement  ADL's:  Impaired  Cognition: Impaired,  Mild  Sleep:      Risk to Self: Yes Risk to Others: No Prior Inpatient Therapy: Yes Prior Outpatient Therapy: Yes Alcohol Screening: Patient refused Alcohol Screening Tool: Yes 1. How often do you have a drink containing alcohol?: 4 or more times a week 2. How many drinks containing alcohol do you have on a typical day when you are drinking?: 10 or more 3. How often do you have six or more drinks on one occasion?: Daily or almost daily Preliminary Score: 8 4. How often during the last year have you found that you were not  able to stop drinking once you had started?: Daily or almost daily 5. How often during the last year have you failed to do what was normally expected from you becasue of drinking?: Daily or almost daily 6. How often during the last year have you needed a first drink in the morning to get yourself going after a heavy drinking session?: Never 7. How often during the last year have you had a feeling of guilt of remorse after drinking?: Never 8. How often during the last year have you been unable to remember what happened the night before because you had been drinking?: Never 9. Have you or someone else been injured as a result of your drinking?: No 10. Has a relative or friend or a doctor or another health worker been concerned about your drinking or suggested you cut down?: No Alcohol Use Disorder Identification Test Final Score (AUDIT): 20 Brief  Intervention: MD notified of score 20 or above (Dr. Parke Poisson patient was seen at time of admission)  Allergies:  No Known Allergies Lab Results:  Results for orders placed or performed during the hospital encounter of 04/07/14 (from the past 48 hour(s))  Acetaminophen level     Status: Abnormal   Collection Time: 04/07/14  3:40 AM  Result Value Ref Range   Acetaminophen (Tylenol), Serum <10.0 (L) 10 - 30 ug/mL    Comment:        THERAPEUTIC CONCENTRATIONS VARY SIGNIFICANTLY. A RANGE OF 10-30 ug/mL MAY BE AN EFFECTIVE CONCENTRATION FOR MANY PATIENTS. HOWEVER, SOME ARE BEST TREATED AT CONCENTRATIONS OUTSIDE THIS RANGE. ACETAMINOPHEN CONCENTRATIONS >150 ug/mL AT 4 HOURS AFTER INGESTION AND >50 ug/mL AT 12 HOURS AFTER INGESTION ARE OFTEN ASSOCIATED WITH TOXIC REACTIONS.   CBC     Status: None   Collection Time: 04/07/14  3:40 AM  Result Value Ref Range   WBC 8.1 4.0 - 10.5 K/uL   RBC 5.05 4.22 - 5.81 MIL/uL   Hemoglobin 16.7 13.0 - 17.0 g/dL   HCT 47.6 39.0 - 52.0 %   MCV 94.3 78.0 - 100.0 fL   MCH 33.1 26.0 - 34.0 pg   MCHC 35.1 30.0 - 36.0 g/dL   RDW 12.8 11.5 - 15.5 %   Platelets 266 150 - 400 K/uL  Comprehensive metabolic panel     Status: Abnormal   Collection Time: 04/07/14  3:40 AM  Result Value Ref Range   Sodium 138 135 - 145 mmol/L   Potassium 3.9 3.5 - 5.1 mmol/L   Chloride 104 96 - 112 mmol/L   CO2 24 19 - 32 mmol/L   Glucose, Bld 115 (H) 70 - 99 mg/dL   BUN 8 6 - 23 mg/dL   Creatinine, Ser 0.82 0.50 - 1.35 mg/dL   Calcium 9.4 8.4 - 10.5 mg/dL   Total Protein 7.9 6.0 - 8.3 g/dL   Albumin 4.4 3.5 - 5.2 g/dL   AST 24 0 - 37 U/L   ALT 34 0 - 53 U/L   Alkaline Phosphatase 76 39 - 117 U/L   Total Bilirubin 1.0 0.3 - 1.2 mg/dL   GFR calc non Af Amer >90 >90 mL/min   GFR calc Af Amer >90 >90 mL/min    Comment: (NOTE) The eGFR has been calculated using the CKD EPI equation. This calculation has not been validated in all clinical situations. eGFR's persistently  <90 mL/min signify possible Chronic Kidney Disease.    Anion gap 10 5 - 15  Ethanol (ETOH)     Status: None  Collection Time: 04/07/14  3:40 AM  Result Value Ref Range   Alcohol, Ethyl (B) <5 0 - 9 mg/dL    Comment:        LOWEST DETECTABLE LIMIT FOR SERUM ALCOHOL IS 11 mg/dL FOR MEDICAL PURPOSES ONLY   Salicylate level     Status: None   Collection Time: 04/07/14  3:40 AM  Result Value Ref Range   Salicylate Lvl <2.9 2.8 - 20.0 mg/dL  Urine Drug Screen     Status: Abnormal   Collection Time: 04/07/14  3:45 AM  Result Value Ref Range   Opiates NONE DETECTED NONE DETECTED   Cocaine POSITIVE (A) NONE DETECTED   Benzodiazepines NONE DETECTED NONE DETECTED   Amphetamines NONE DETECTED NONE DETECTED   Tetrahydrocannabinol NONE DETECTED NONE DETECTED   Barbiturates NONE DETECTED NONE DETECTED    Comment:        DRUG SCREEN FOR MEDICAL PURPOSES ONLY.  IF CONFIRMATION IS NEEDED FOR ANY PURPOSE, NOTIFY LAB WITHIN 5 DAYS.        LOWEST DETECTABLE LIMITS FOR URINE DRUG SCREEN Drug Class       Cutoff (ng/mL) Amphetamine      1000 Barbiturate      200 Benzodiazepine   528 Tricyclics       413 Opiates          300 Cocaine          300 THC              50    Current Medications: Current Facility-Administered Medications  Medication Dose Route Frequency Provider Last Rate Last Dose  . alum & mag hydroxide-simeth (MAALOX/MYLANTA) 200-200-20 MG/5ML suspension 30 mL  30 mL Oral Q4H PRN Dara Hoyer, PA-C      . hydrOXYzine (ATARAX/VISTARIL) tablet 25 mg  25 mg Oral Q6H PRN Jenne Campus, MD      . lamoTRIgine (LAMICTAL) tablet 25 mg  25 mg Oral Daily Myer Peer Arlina Sabina, MD      . loperamide (IMODIUM) capsule 2-4 mg  2-4 mg Oral PRN Jenne Campus, MD      . LORazepam (ATIVAN) tablet 1 mg  1 mg Oral Q6H PRN Myer Peer Yuji Walth, MD      . LORazepam (ATIVAN) tablet 1 mg  1 mg Oral QID Jenne Campus, MD       Followed by  . [START ON 04/08/2014] LORazepam (ATIVAN) tablet 1 mg  1  mg Oral TID Jenne Campus, MD       Followed by  . [START ON 04/09/2014] LORazepam (ATIVAN) tablet 1 mg  1 mg Oral BID Jenne Campus, MD       Followed by  . [START ON 04/11/2014] LORazepam (ATIVAN) tablet 1 mg  1 mg Oral Daily Vernisha Bacote A Brown Dunlap, MD      . magnesium hydroxide (MILK OF MAGNESIA) suspension 30 mL  30 mL Oral Daily PRN Dara Hoyer, PA-C      . multivitamin with minerals tablet 1 tablet  1 tablet Oral Daily Myer Peer Chevella Pearce, MD      . ondansetron (ZOFRAN-ODT) disintegrating tablet 4 mg  4 mg Oral Q6H PRN Myer Peer Lucas Winograd, MD      . thiamine (B-1) injection 100 mg  100 mg Intramuscular Once Jenne Campus, MD      . Derrill Memo ON 04/08/2014] thiamine (VITAMIN B-1) tablet 100 mg  100 mg Oral Daily Jenne Campus, MD  PTA Medications: Prescriptions prior to admission  Medication Sig Dispense Refill Last Dose  . albuterol (PROVENTIL HFA;VENTOLIN HFA) 108 (90 BASE) MCG/ACT inhaler Inhale 2 puffs into the lungs every 6 (six) hours as needed for wheezing or shortness of breath.   Unknown at Unknown time  . ARIPiprazole (ABILIFY) 9.75 MG/1.3ML injection Inject 5.25 mg into the muscle every 30 (thirty) days.   Unknown at Unknown time  . FLUoxetine (PROZAC) 20 MG capsule Take 1 capsule (20 mg total) by mouth daily. 30 capsule 0 Unknown at Unknown time  . gabapentin (NEURONTIN) 100 MG capsule Take 2 capsules (200 mg total) by mouth 3 (three) times daily. 180 capsule 0 Unknown at Unknown time  . Multiple Vitamin (MULTIVITAMIN WITH MINERALS) TABS tablet Take 1 tablet by mouth daily. (Patient not taking: Reported on 04/07/2014)   Unknown at Unknown time  . paliperidone (INVEGA) 3 MG 24 hr tablet Take 1 tablet (3 mg total) by mouth daily. (Patient not taking: Reported on 04/07/2014) 30 tablet 0 Unknown at Unknown time  . traZODone (DESYREL) 100 MG tablet Take 1 tablet (100 mg total) by mouth at bedtime. (Patient not taking: Reported on 04/07/2014) 30 tablet 0 Unknown at Unknown time    Previous Psychotropic Medications: Yes   Substance Abuse History in the last 12 months:  Yes.    Consequences of Substance Abuse: Medical Consequences:  Liver damage, Possible death by overdose Legal Consequences:  Arrests, jail time, Loss of driving privilege. Family Consequences:  Family discord, divorce and or separation.  Results for orders placed or performed during the hospital encounter of 04/07/14 (from the past 72 hour(s))  Acetaminophen level     Status: Abnormal   Collection Time: 04/07/14  3:40 AM  Result Value Ref Range   Acetaminophen (Tylenol), Serum <10.0 (L) 10 - 30 ug/mL    Comment:        THERAPEUTIC CONCENTRATIONS VARY SIGNIFICANTLY. A RANGE OF 10-30 ug/mL MAY BE AN EFFECTIVE CONCENTRATION FOR MANY PATIENTS. HOWEVER, SOME ARE BEST TREATED AT CONCENTRATIONS OUTSIDE THIS RANGE. ACETAMINOPHEN CONCENTRATIONS >150 ug/mL AT 4 HOURS AFTER INGESTION AND >50 ug/mL AT 12 HOURS AFTER INGESTION ARE OFTEN ASSOCIATED WITH TOXIC REACTIONS.   CBC     Status: None   Collection Time: 04/07/14  3:40 AM  Result Value Ref Range   WBC 8.1 4.0 - 10.5 K/uL   RBC 5.05 4.22 - 5.81 MIL/uL   Hemoglobin 16.7 13.0 - 17.0 g/dL   HCT 47.6 39.0 - 52.0 %   MCV 94.3 78.0 - 100.0 fL   MCH 33.1 26.0 - 34.0 pg   MCHC 35.1 30.0 - 36.0 g/dL   RDW 12.8 11.5 - 15.5 %   Platelets 266 150 - 400 K/uL  Comprehensive metabolic panel     Status: Abnormal   Collection Time: 04/07/14  3:40 AM  Result Value Ref Range   Sodium 138 135 - 145 mmol/L   Potassium 3.9 3.5 - 5.1 mmol/L   Chloride 104 96 - 112 mmol/L   CO2 24 19 - 32 mmol/L   Glucose, Bld 115 (H) 70 - 99 mg/dL   BUN 8 6 - 23 mg/dL   Creatinine, Ser 0.82 0.50 - 1.35 mg/dL   Calcium 9.4 8.4 - 10.5 mg/dL   Total Protein 7.9 6.0 - 8.3 g/dL   Albumin 4.4 3.5 - 5.2 g/dL   AST 24 0 - 37 U/L   ALT 34 0 - 53 U/L   Alkaline Phosphatase 76 39 - 117 U/L  Total Bilirubin 1.0 0.3 - 1.2 mg/dL   GFR calc non Af Amer >90 >90 mL/min   GFR  calc Af Amer >90 >90 mL/min    Comment: (NOTE) The eGFR has been calculated using the CKD EPI equation. This calculation has not been validated in all clinical situations. eGFR's persistently <90 mL/min signify possible Chronic Kidney Disease.    Anion gap 10 5 - 15  Ethanol (ETOH)     Status: None   Collection Time: 04/07/14  3:40 AM  Result Value Ref Range   Alcohol, Ethyl (B) <5 0 - 9 mg/dL    Comment:        LOWEST DETECTABLE LIMIT FOR SERUM ALCOHOL IS 11 mg/dL FOR MEDICAL PURPOSES ONLY   Salicylate level     Status: None   Collection Time: 04/07/14  3:40 AM  Result Value Ref Range   Salicylate Lvl <4.9 2.8 - 20.0 mg/dL  Urine Drug Screen     Status: Abnormal   Collection Time: 04/07/14  3:45 AM  Result Value Ref Range   Opiates NONE DETECTED NONE DETECTED   Cocaine POSITIVE (A) NONE DETECTED   Benzodiazepines NONE DETECTED NONE DETECTED   Amphetamines NONE DETECTED NONE DETECTED   Tetrahydrocannabinol NONE DETECTED NONE DETECTED   Barbiturates NONE DETECTED NONE DETECTED    Comment:        DRUG SCREEN FOR MEDICAL PURPOSES ONLY.  IF CONFIRMATION IS NEEDED FOR ANY PURPOSE, NOTIFY LAB WITHIN 5 DAYS.        LOWEST DETECTABLE LIMITS FOR URINE DRUG SCREEN Drug Class       Cutoff (ng/mL) Amphetamine      1000 Barbiturate      200 Benzodiazepine   449 Tricyclics       675 Opiates          300 Cocaine          300 THC              50     Observation Level/Precautions:  15 minute checks  Laboratory:  Per ED  Psychotherapy: Group sessions    Medications: See medication lists  Consultations: As neded    Discharge Concerns: Mood stability  Estimated LOS: 3-5 days  Other:     Psychological Evaluations: Yes   Treatment Plan Summary: Daily contact with patient to assess and evaluate symptoms and progress in treatment and Medication management: 1. Admit for crisis management and stabilization, estimated length of stay 3-5 days.  2. Medication management to reduce  current symptoms to base line and improve the patient's overall level of functioning; re-instate Abilify injectable 5.25 mg dur to be given 05-06-14   3. Treat health problems as indicated.  4. Develop treatment plan to decrease risk of relapse upon discharge and the need for readmission.  5. Psycho-social education regarding relapse prevention and self care.  6. Health care follow up as needed for medical problems.  7. Review, reconcile, and reinstate any pertinent home medications for other health issues where appropriate. 8. Call for consults with hospitalist for any additional specialty patient care services as needed.  Medical Decision Making:  New problem, with additional work up planned, Review of Psycho-Social Stressors (1), Review or order clinical lab tests (1), Review and summation of old records (2), Review or order medicine tests (1), Review of Medication Regimen & Side Effects (2) and Review of New Medication or Change in Dosage (2)  I certify that inpatient services furnished can reasonably be expected to improve  the patient's condition.   Encarnacion Slates, PMHNp-BC 2/14/201611:34 AM   I have discussed case with NP as above and have met with patient. Agree with NP's assessment and plan as above. Patient is a 44 year old man with a history of Bipolar Spectrum Disorder and Alcohol/Cocaine Abuse. In the context of psychosocial stressors, particularly parents's deteriorating health, had relapsed, and stopped taking his psychiatric medications, resulting in increased depression and suicidal ideations.

## 2014-04-07 NOTE — [facility] Suicide Risk Assessment (Signed)
[facility]   Nursing information obtained from:    Demographic factors:   44 year old single male, lives with mother, on disability Current Mental Status:   See below Loss Factors:   disability, strain with family, mother's failing health Historical Factors:   cocaine dependence, alcohol dependence, history of Mood Disorder  Risk Reduction Factors:   Resilience  Total Time spent with patient: 45 minutes Principal Problem: Depression, Alcohol and Cocaine dependencies  Diagnosis:   Patient Active Problem List   Diagnosis Date Noted  . Alcohol dependence with uncomplicated withdrawal [F10.230] 04/07/2014  . Bipolar affective disorder, depressed, severe [F31.4] 04/07/2014  . Suicidal ideations [R45.851] 04/07/2014  . Alcohol abuse, episodic [F10.10] 04/07/2014  . Bipolar I disorder, most recent episode mixed [F31.60] 08/31/2013  . Anxiety state, unspecified [F41.1] 08/31/2013  . Bipolar 1 disorder [F31.9] 08/30/2013  . 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 Diarrhea [R19.7] 01/30/2013  . Postop check [Z09] 01/30/2013  . Abdominal pain [R10.9] 12/18/2012  . Family history of cardiac disorder [Z82.49] 09/20/2012  . Excessive sweating, local [L74.519] 09/20/2012  . Compulsive tobacco user syndrome [F17.200] 09/20/2012     Continued Clinical Symptoms:    The "Alcohol Use Disorders Identification Test", Guidelines for Use in Primary Care, Second Edition.  World Science writerHealth Organization [facility](WHO). Score between 0-7:  no or low risk or alcohol related problems. Score between 8-15:  moderate risk of alcohol related problems. Score between 16-19:  high risk of alcohol related problems. Score 20 or above:  warrants further diagnostic evaluation for alcohol dependence and treatment.   CLINICAL FACTORS:  44 year old man, has been diagnosed with BIpolar Disorder in the past. Has history of cocaine dependence, relapsed x 1 day earlier this week after several  years of sobriety. Has also been drinking 6 to 8 beers daily. Presents depressed, with recent suicidal ideations.    Musculoskeletal: Strength & Muscle Tone: within normal limits Gait & Station: normal Patient leans: N/A  Psychiatric Specialty Exam: Physical Exam  Review of Systems  Constitutional: Negative for fever and chills.  Respiratory: Negative for cough and shortness of breath.   Cardiovascular: Negative for chest pain.  Gastrointestinal: Positive for nausea. Negative for blood in stool and melena.  Genitourinary: Negative for dysuria, urgency and frequency.  Musculoskeletal: Negative for myalgias.  Skin: Negative for rash.  Neurological: Positive for headaches. Negative for seizures and loss of consciousness.  Psychiatric/Behavioral: Positive for depression, suicidal ideas and substance abuse.    BP 114/68. Pulse 81  General Appearance: Fairly Groomed  Patent attorneyye Contact::  Good  Speech:  Slow  Volume:  Decreased  Mood:  Depressed  Affect:  Constricted  Thought Process:  Goal Directed and Linear  Orientation:  Other:  alert and attentive  Thought Content:  denies hallucinations at this time , and does not appear internally preoccupied. No delusions expressed   Suicidal Thoughts:  Yes.  without intent/plan  at this time denies any thoughts of hurting self and  contracts for safety on unit   Homicidal Thoughts:  No  Memory:  recent and remote grossly intact   Judgement:  Fair  Insight:  Fair  Psychomotor Activity:  Normal- slight distal tremors   Concentration:  Good  Recall:  Good  Fund of Knowledge:Good  Language: Good  Akathisia:  Negative  Handed:  Right  AIMS (if indicated):     Assets:  Desire for Improvement Housing Resilience  Sleep:     Cognition: WNL  ADL's:  Impaired     COGNITIVE FEATURES THAT CONTRIBUTE TO RISK:  Closed-mindedness    SUICIDE RISK:   Moderate:  Frequent suicidal ideation with limited intensity, and duration, some specificity in  terms of plans, no associated intent, good self-control, limited dysphoria/symptomatology, some risk factors present, and identifiable protective factors, including available and accessible social support.  PLAN OF CARE: Patient will be admitted to inpatient psychiatric unit for stabilization and safety. Will provide and encourage milieu participation. Provide medication management and maked adjustments as needed. Will also provide medication management to minimize risk of alcohol withdrawal.   Will follow daily.    Medical Decision Making:  Review of Psycho-Social Stressors (1), Review or order clinical lab tests (1), Established Problem, Worsening (2), Review of Medication Regimen & Side Effects (2) and Review of New Medication or Change in Dosage (2)  I certify that inpatient services furnished can reasonably be expected to improve the patient's condition.   COBOS, FERNANDO 04/07/2014, 11:05 AM

## 2014-04-07 NOTE — ED Notes (Signed)
Pt wanded by security. 

## 2014-04-07 NOTE — ED Notes (Signed)
Patient reports SI. Denies HI, AVH. States that his feelings of anxiety and depression are 7/10. Reports poor sleep in recent weeks stating "I don't feel I get enough (sleep)". States that his appetite is good but that he needs soft foods because he lacks teeth.  Encouragement offered.  Q 15 safety checks in place.

## 2014-04-08 DIAGNOSIS — F1024 Alcohol dependence with alcohol-induced mood disorder: Secondary | ICD-10-CM

## 2014-04-08 DIAGNOSIS — F1414 Cocaine abuse with cocaine-induced mood disorder: Secondary | ICD-10-CM | POA: Diagnosis present

## 2014-04-08 NOTE — Tx Team (Signed)
Initial Interdisciplinary Treatment Plan   PATIENT STRESSORS: Medication change or noncompliance Substance abuse   PATIENT STRENGTHS: Ability for insight Capable of independent living   PROBLEM LIST: Problem List/Patient Goals Date to be addressed Date deferred Reason deferred Estimated date of resolution  Substance Abuse "to not use drugs" 04/08/14   At D/C  Suicidal Ideations "I want my thoughts to be better" 04/08/14   At D/C                                             DISCHARGE CRITERIA:  Ability to meet basic life and health needs Improved stabilization in mood, thinking, and/or behavior Motivation to continue treatment in a less acute level of care Reduction of life-threatening or endangering symptoms to within safe limits Verbal commitment to aftercare and medication compliance Withdrawal symptoms are absent or subacute and managed without 24-hour nursing intervention  PRELIMINARY DISCHARGE PLAN: Attend aftercare/continuing care group Attend PHP/IOP Outpatient therapy Return to previous living arrangement  PATIENT/FAMIILY INVOLVEMENT: This treatment plan has been presented to and reviewed with the patient, [name].  The patient and family have been given the opportunity to ask questions and make suggestions.  [name], [name] 04/08/2014, 10:15 PM

## 2014-04-08 NOTE — Progress Notes (Signed)
Pt alert and cooperative. Affect/mood depressed. -SI/HI, verbally contracts for safety. -A/[place].Reported headache off/on today. Pt attended group but left early, reported feeling tired. Emotional support and encouragement given. Will continue to monitor closely and evaluate for stabilization.

## 2014-04-08 NOTE — [facility] Group Notes (Signed)
[facility] LCSW Group Therapy  Overcoming Obstacles 1:15 - 2:30 PM  04/08/2014 3:23 PM  Type of Therapy:  Group Therapy  Participation Level:  Did Not Attend - Patient was in group room but left as group started.  Wynn BankerHodnett, Rajvir Ernster Hairston 04/08/2014, 3:23 PM

## 2014-04-08 NOTE — Progress Notes (Signed)
[facility] MD Progress Note  04/08/2014 5:57 PM SALBADOR [name]  MRN:  [id number] Subjective:  Bosten states that he relapsed after being abstinent for a long time. States he has been dealing with a lot of stress in part seeing his parents age and be in bad physical conditions. States that they have decided that the best for all of them is that he leaves [place] where he can stay in a safe place with a friend. States this is going to happen on March the 3 rd. He is being followed up by the ACT team. States she started hearing voices telling him to kill himself. He has tried to hurt himself before responding to the voices. States he is very ashamed of having relapsed Principal Problem: <principal problem not specified> Diagnosis:   Patient Active Problem List   Diagnosis Date Noted  . Alcohol dependence with uncomplicated withdrawal [W09.811] 04/07/2014  . Bipolar affective disorder, depressed, severe [F31.4] 04/07/2014  . Suicidal ideations [R45.851] 04/07/2014  . Alcohol abuse, episodic [F10.10] 04/07/2014  . Alcohol dependence with alcohol-induced mood disorder [F10.24]   . Bipolar I disorder, most recent episode mixed [F31.60] 08/31/2013  . Anxiety state, unspecified [F41.1] 08/31/2013  . Bipolar 1 disorder [F31.9] 08/30/2013  . 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 Diarrhea [R19.7] 01/30/2013  . Postop check [Z09] 01/30/2013  . Abdominal pain [R10.9] 12/18/2012  . Family history of cardiac disorder [Z82.49] 09/20/2012  . Excessive sweating, local [L74.519] 09/20/2012  . Compulsive tobacco user syndrome [F17.200] 09/20/2012   Total Time spent with patient: 30 minutes   Past Medical History:  Past Medical History  Diagnosis Date  . Bipolar 1 disorder   . Anxiety   . Stroke     Past Surgical History  Procedure Laterality Date  . Finger surgery    . Cholecystectomy N/A 12/24/2012    Procedure: LAPAROSCOPIC CHOLECYSTECTOMY;  Surgeon: Gwenyth Ober, MD;   Location: [place];  Service: General;  Laterality: N/A;   Family History:  Family History  Problem Relation Age of Onset  . Heart disease Mother    Social History:  History  Alcohol Use  . 12.0 oz/week  . 20 Cans of beer per week    Comment: 3 24oz beers every other day     History  Drug Use No    History   Social History  . Marital Status: Single    Spouse Name: N/A  . Number of Children: N/A  . Years of Education: N/A   Social History Main Topics  . Smoking status: Current Every Day Smoker -- 1.00 packs/day for 5 years    Types: Cigarettes  . Smokeless tobacco: Not on file  . Alcohol Use: 12.0 oz/week    20 Cans of beer per week     Comment: 3 24oz beers every other day  . Drug Use: No  . Sexual Activity: No   Other Topics Concern  . None   Social History Narrative   Additional History:    Sleep: Fair  Appetite:  Fair   Assessment:   Musculoskeletal: Strength & Muscle Tone: within normal limits Gait & Station: normal Patient leans: N/A   Psychiatric Specialty Exam: Physical Exam  Review of Systems  Constitutional: Negative.   HENT: Negative.   Eyes: Negative.   Respiratory: Negative.   Cardiovascular: Negative.   Gastrointestinal: Negative.   Genitourinary: Negative.   Musculoskeletal: Negative.   Skin: Negative.   Endo/Heme/Allergies: Negative.  Psychiatric/Behavioral: Positive for depression and substance abuse. The patient is nervous/anxious.     Blood pressure 109/77, pulse 70, temperature 97.8 F (36.6 C), temperature source Oral, resp. rate 18, height _0  (1.727 m), weight 78.472 kg (173 lb).Body mass index is 26.31 kg/(m^2).  General Appearance: Fairly Groomed  Engineer, water::  Fair  Speech:  Clear and Coherent, Slow and not spontaneous  Volume:  Decreased  Mood:  Anxious and Depressed  Affect:  Restricted  Thought Process:  Coherent and Goal Directed  Orientation:  Full (Time, Place, and Person)  Thought Content:  symptoms  events worries concerns  Suicidal Thoughts:  not now  Homicidal Thoughts:  No  Memory:  Immediate;   Fair Recent;   Fair Remote;   Fair  Judgement:  Fair  Insight:  Present and Shallow  Psychomotor Activity:  Restlessness  Concentration:  Fair  Recall:  [employer] of Knowledge:Fair  Language: Fair  Akathisia:  No  Handed:  Right  AIMS (if indicated):     Assets:  Desire for Improvement Housing Social Support  ADL's:  Intact  Cognition: WNL  Sleep:  Number of Hours: 6.5     Current Medications: Current Facility-Administered Medications  Medication Dose Route Frequency Provider Last Rate Last Dose  . albuterol (PROVENTIL HFA;VENTOLIN HFA) 108 (90 BASE) MCG/ACT inhaler 2 puff  2 puff Inhalation Q6H PRN Encarnacion Slates, NP      . alum & mag hydroxide-simeth (MAALOX/MYLANTA) 200-200-20 MG/5ML suspension 30 mL  30 mL Oral Q4H PRN Dara Hoyer, PA-C      . ARIPiprazole (ABILIFY) injection 5.25 mg  5.25 mg Intramuscular Q30 days Encarnacion Slates, NP   5.25 mg at 04/07/14 1653  . FLUoxetine (PROZAC) capsule 20 mg  20 mg Oral Daily Encarnacion Slates, NP   20 mg at 04/08/14 0825  . gabapentin (NEURONTIN) capsule 200 mg  200 mg Oral TID Encarnacion Slates, NP   200 mg at 04/08/14 1612  . hydrOXYzine (ATARAX/VISTARIL) tablet 25 mg  25 mg Oral Q6H PRN Jenne Campus, MD      . lamoTRIgine (LAMICTAL) tablet 25 mg  25 mg Oral Daily Jenne Campus, MD   25 mg at 04/08/14 0826  . loperamide (IMODIUM) capsule 2-4 mg  2-4 mg Oral PRN Jenne Campus, MD      . LORazepam (ATIVAN) tablet 1 mg  1 mg Oral Q6H PRN Jenne Campus, MD      . LORazepam (ATIVAN) tablet 1 mg  1 mg Oral TID Jenne Campus, MD   1 mg at 04/08/14 1701   Followed by  . [START ON 04/09/2014] LORazepam (ATIVAN) tablet 1 mg  1 mg Oral BID Jenne Campus, MD       Followed by  . [START ON 04/11/2014] LORazepam (ATIVAN) tablet 1 mg  1 mg Oral Daily Fernando A Cobos, MD      . magnesium hydroxide (MILK OF MAGNESIA) suspension 30 mL   30 mL Oral Daily PRN Dara Hoyer, PA-C      . multivitamin with minerals tablet 1 tablet  1 tablet Oral Daily Encarnacion Slates, NP   1 tablet at 04/08/14 0825  . ondansetron (ZOFRAN-ODT) disintegrating tablet 4 mg  4 mg Oral Q6H PRN Myer Peer Cobos, MD      . thiamine (B-1) injection 100 mg  100 mg Intramuscular Once Jenne Campus, MD   100 mg at 04/07/14 1209  . thiamine (  VITAMIN B-1) tablet 100 mg  100 mg Oral Daily Jenne Campus, MD   100 mg at 04/08/14 0825  . traZODone (DESYREL) tablet 100 mg  100 mg Oral QHS Encarnacion Slates, NP   100 mg at 04/07/14 2200    Lab Results:  Results for orders placed or performed during the hospital encounter of 04/07/14 (from the past 48 hour(s))  Acetaminophen level     Status: Abnormal   Collection Time: 04/07/14  3:40 AM  Result Value Ref Range   Acetaminophen (Tylenol), Serum <10.0 (L) 10 - 30 ug/mL    Comment:        THERAPEUTIC CONCENTRATIONS VARY SIGNIFICANTLY. A RANGE OF 10-30 ug/mL MAY BE AN EFFECTIVE CONCENTRATION FOR MANY PATIENTS. HOWEVER, SOME ARE BEST TREATED AT CONCENTRATIONS OUTSIDE THIS RANGE. ACETAMINOPHEN CONCENTRATIONS >150 ug/mL AT 4 HOURS AFTER INGESTION AND >50 ug/mL AT 12 HOURS AFTER INGESTION ARE OFTEN ASSOCIATED WITH TOXIC REACTIONS.   CBC     Status: None   Collection Time: 04/07/14  3:40 AM  Result Value Ref Range   WBC 8.1 4.0 - 10.5 K/uL   RBC 5.05 4.22 - 5.81 MIL/uL   Hemoglobin 16.7 13.0 - 17.0 g/dL   HCT 47.6 39.0 - 52.0 %   MCV 94.3 78.0 - 100.0 fL   MCH 33.1 26.0 - 34.0 pg   MCHC 35.1 30.0 - 36.0 g/dL   RDW 12.8 11.5 - 15.5 %   Platelets 266 150 - 400 K/uL  Comprehensive metabolic panel     Status: Abnormal   Collection Time: 04/07/14  3:40 AM  Result Value Ref Range   Sodium 138 135 - 145 mmol/L   Potassium 3.9 3.5 - 5.1 mmol/L   Chloride 104 96 - 112 mmol/L   CO2 24 19 - 32 mmol/L   Glucose, Bld 115 (H) 70 - 99 mg/dL   BUN 8 6 - 23 mg/dL   Creatinine, Ser 0.82 0.50 - 1.35 mg/dL   Calcium  9.4 8.4 - 10.5 mg/dL   Total Protein 7.9 6.0 - 8.3 g/dL   Albumin 4.4 3.5 - 5.2 g/dL   AST 24 0 - 37 U/L   ALT 34 0 - 53 U/L   Alkaline Phosphatase 76 39 - 117 U/L   Total Bilirubin 1.0 0.3 - 1.2 mg/dL   GFR calc non Af Amer >90 >90 mL/min   GFR calc Af Amer >90 >90 mL/min    Comment: (NOTE) The eGFR has been calculated using the CKD EPI equation. This calculation has not been validated in all clinical situations. eGFR's persistently <90 mL/min signify possible Chronic Kidney Disease.    Anion gap 10 5 - 15  Ethanol (ETOH)     Status: None   Collection Time: 04/07/14  3:40 AM  Result Value Ref Range   Alcohol, Ethyl (B) <5 0 - 9 mg/dL    Comment:        LOWEST DETECTABLE LIMIT FOR SERUM ALCOHOL IS 11 mg/dL FOR MEDICAL PURPOSES ONLY   Salicylate level     Status: None   Collection Time: 04/07/14  3:40 AM  Result Value Ref Range   Salicylate Lvl <9.8 2.8 - 20.0 mg/dL  Urine Drug Screen     Status: Abnormal   Collection Time: 04/07/14  3:45 AM  Result Value Ref Range   Opiates NONE DETECTED NONE DETECTED   Cocaine POSITIVE (A) NONE DETECTED   Benzodiazepines NONE DETECTED NONE DETECTED   Amphetamines NONE DETECTED NONE DETECTED  Tetrahydrocannabinol NONE DETECTED NONE DETECTED   Barbiturates NONE DETECTED NONE DETECTED    Comment:        DRUG SCREEN FOR MEDICAL PURPOSES ONLY.  IF CONFIRMATION IS NEEDED FOR ANY PURPOSE, NOTIFY LAB WITHIN 5 DAYS.        LOWEST DETECTABLE LIMITS FOR URINE DRUG SCREEN Drug Class       Cutoff (ng/mL) Amphetamine      1000 Barbiturate      200 Benzodiazepine   409 Tricyclics       811 Opiates          300 Cocaine          300 THC              50     Physical Findings: AIMS: Facial and Oral Movements Muscles of Facial Expression: None, normal Lips and Perioral Area: None, normal Jaw: None, normal Tongue: None, normal,Extremity Movements Upper (arms, wrists, hands, fingers): None, normal Lower (legs, knees, ankles, toes): None,  normal, Trunk Movements Neck, shoulders, hips: None, normal, Overall Severity Severity of abnormal movements (highest score from questions above): None, normal Incapacitation due to abnormal movements: None, normal Patient's awareness of abnormal movements (rate only patient's report): No Awareness, Dental Status Current problems with teeth and/or dentures?: No Does patient usually wear dentures?: No  CIWA:  CIWA-Ar Total: 1 COWS:  COWS Total Score: 1  Treatment Plan Summary: Daily contact with patient to assess and evaluate symptoms and progress in treatment and Medication management Supportive approach/coping skills/relapse prevention Alcohol Dependence: Ativan detox protocol, evaluate for cravings, need for anti craving medications Bipolar Disorder: continue Abilify, add Lamictal and optimize response Depression: continue Prozac  Medical Decision Making:  Review of Psycho-Social Stressors (1), Review or order clinical lab tests (1), Review of Last Therapy Session (1) and Review or order medicine tests (1)     Cala Kruckenberg A 04/08/2014, 5:57 PM

## 2014-04-08 NOTE — Progress Notes (Addendum)
D:  Patient denied SI and HI.  Denied A/V hallucinations.  Denied pain. A:  Medications administered per MD orders.  Emotional support and encouragement given patient. R:  Safety maintained with 15 minute checks. Patient's self inventory sheet, patient slept good last night, sleep medication not needed.  Good appetite, normal energy level, good concentration.  Rated depression, hopeless and anxiety #8.  Withdrawals of tremors, cravings, agitation, irritability.  Denied SI.  Has experienced pain, headache in past 24 hours.   Worst pain #7, head.   No discharge plans.  No problems anticipated after discharge.

## 2014-04-08 NOTE — Plan of Care (Signed)
Problem: Consults Goal: Suicide Risk Patient Education (See Patient Education module for education specifics)  Outcome: Completed/Met Date Met:  04/08/14 Nurse discuss suicidal thoughts with pt.

## 2014-04-08 NOTE — [facility] Suicide Risk Assessment (Signed)
[facility] INPATIENT:  Family/Significant Other Suicide Prevention Education  Suicide Prevention Education:  Patient Refusal for Family/Significant Other Suicide Prevention Education: The patient [name] has refused to provide written consent for family/significant other to be provided Family/Significant Other Suicide Prevention Education during admission and/or prior to discharge.  Physician notified.  Wynn BankerHodnett, Trumaine Wimer Hairston 04/08/2014, 11:44 AM

## 2014-04-08 NOTE — [facility] Group Notes (Signed)
[facility]   04/08/2014 10:22 AM    Participation Quality:  Appropraite  Mood/Affect:  Appropriate  Depression Rating:  8  Anxiety Rating:  8  Thoughts of Suicide:  No  Will you contract for safety?   NA  Current AVH:  No  Plan for Discharge/Comments:  Patient attended discharge planning group and actively participated in group. He advised of [facility] providing ACT Team services. Suicide prevention education reviewed and SPE document provided.   Transportation Means: Patient has transportation.   Supports:  Patient has a support system.   [name], Joesph JulyQuylle Hairston

## 2014-04-08 NOTE — Progress Notes (Signed)
Adult Psychoeducational Group Note  Date:  04/08/2014 Time:  10:17 PM  Group Topic/Focus:  Wrap-Up Group:   The focus of this group is to help patients review their daily goal of treatment and discuss progress on daily workbooks.  Participation Level:  Did Not Attend  Participation Quality:  pt did not attend  Affect:  pt did not attend  Cognitive:  pt did not attend  Insight: None  Engagement in Group:  pt did not attend  Modes of Intervention:  pt did not attend  Additional Comments:  Pt did not attend  Tabithia Stroder A 04/08/2014, 10:17 PM
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
## 2014-04-09 MED ORDER — ADULT MULTIVITAMIN W/MINERALS CH: 1.0000 | ORAL_TABLET | Freq: Every day | ORAL | Status: DC

## 2014-04-09 MED ORDER — FLUOXETINE HCL 20 MG PO CAPS: 20.0000 mg | ORAL_CAPSULE | Freq: Every day | ORAL | Status: DC

## 2014-04-09 MED ORDER — ACAMPROSATE CALCIUM 333 MG PO TBEC: 666.0000 mg | DELAYED_RELEASE_TABLET | Freq: Three times a day (TID) | ORAL | Status: DC

## 2014-04-09 MED ORDER — GABAPENTIN 100 MG PO CAPS: 200.0000 mg | ORAL_CAPSULE | Freq: Three times a day (TID) | ORAL | Status: DC

## 2014-04-09 MED ORDER — LAMOTRIGINE 25 MG PO TABS: 25.0000 mg | ORAL_TABLET | Freq: Every day | ORAL | Status: DC

## 2014-04-09 MED ORDER — ACAMPROSATE CALCIUM 333 MG PO TBEC
666.0000 mg | DELAYED_RELEASE_TABLET | Freq: Three times a day (TID) | ORAL | Status: DC
  Administered 2014-04-09: 666 mg via ORAL
  Filled 2014-04-09: qty 2
  Filled 2014-04-09 (×3): qty 18
  Filled 2014-04-09: qty 2

## 2014-04-09 MED ORDER — NICOTINE 21 MG/24HR TD PT24
21.0000 mg | MEDICATED_PATCH | Freq: Every day | TRANSDERMAL | Status: DC
  Administered 2014-04-09: 21 mg via TRANSDERMAL
  Filled 2014-04-09 (×4): qty 1

## 2014-04-09 MED ORDER — TRAZODONE HCL 100 MG PO TABS: 100.0000 mg | ORAL_TABLET | Freq: Every day | ORAL | Status: DC

## 2014-04-09 MED ORDER — ARIPIPRAZOLE 9.75 MG/1.3ML IM SOLN: 5.2500 mg | INTRAMUSCULAR | Status: DC

## 2014-04-09 NOTE — [facility] Group Notes (Signed)
The focus of this group is to educate the patient on the purpose and policies of crisis stabilization and provide a format to answer questions about their admission.  The group details unit policies and expectations of patients while admitted.  Patient did not attend 0900 nurse education orientation group.  Patient was in bed and then walked into group and asked nurse for a nicotine patch.

## 2014-04-09 NOTE — Tx Team (Signed)
Interdisciplinary Treatment Plan Update   Date Reviewed:  04/09/2014  Time Reviewed:  8:47 AM  Progress in Treatment:   Attending groups: Yes, patient attends groups. Participating in groups: Yes, participates sometimes. Taking medication as prescribed: Yes  Tolerating medication: Yes Family/Significant other contact made:  Patient declines collateral contact Patient understands diagnosis:  Yes, patient understands diagnosis and need for treatment. Discussing patient identified problems/goals with staff: Yes, patient is able to express goals for treatment and discharge. Medical problems stabilized or resolved: Yes Denies suicidal/homicidal ideation: Yes Patient has not harmed self or others: Yes  For review of initial/current patient goals, please see plan of care.  Estimated Length of Stay:  Discharge today  Reasons for Continued Hospitalization:    New Problems/Goals identified:    Discharge Plan or Barriers:   Home with outpatient follow up with [facility]  Additional Comments:  Patient and CSW reviewed patient's identified goals and treatment plan.  Patient verbalized understanding and agreed to treatment plan.   Attendees:  Patient:  04/09/2014 8:47 AM   Signature:  Sallyanne HaversF. Cobos, MD 04/09/2014 8:47 AM  Signature: Geoffery LyonsIrving Lugo, MD 04/09/2014 8:47 AM  Signature:  Liborio NixonPatrice White, RN 04/09/2014 8:47 AM  Signature:Beverly Terrilee CroakKnight, RN 04/09/2014 8:47 AM  Signature:   04/09/2014 8:47 AM  Signature:  Juline PatchQuylle Rissie Sculley, LCSW 04/09/2014 8:47 AM  Signature:  Belenda CruiseKristin Drinkard, LCSW-A 04/09/2014 8:47 AM  Signature:  Leisa LenzValerie Enoch, Care Coordinator [facility] 04/09/2014 8:47 AM  Signature:   04/09/2014 8:47 AM  Signature:  04/09/2014  8:47 AM  Signature:    04/09/2014  8:47 AM  Signature:   04/09/2014  8:47 AM    Scribe for Treatment Team:   Juline PatchQuylle Kalesha Irving,  04/09/2014 8:47 AM

## 2014-04-09 NOTE — Progress Notes (Signed)
Patient expects to be discharged around 4:00 p.m. Today.  Pharmacist moved ativan 1 mg and campral up to to 1600 to be given to patient so he can received these medications before he is discharged.  Safety maintained with 15 minute checks.

## 2014-04-09 NOTE — Discharge Summary (Signed)
Physician Discharge Summary Note  Patient:  [name] is an 44 y.o., male MRN:  [id number] DOB:  August 08, 1970 Patient phone:  [phone] (home)  Patient address:   [place] [address],  Total Time spent with patient: 30 minutes  Date of Admission:  04/07/2014 Date of Discharge: 04/09/13  Reason for Admission:  Psychosis, Suicidal thoughts  Principal Problem: Bipolar affective disorder, depressed, severe Discharge Diagnoses: Patient Active Problem List   Diagnosis Date Noted  . Cocaine abuse with cocaine-induced mood disorder [F14.14] 04/08/2014  . Alcohol dependence with uncomplicated withdrawal [V89.381] 04/07/2014  . Bipolar affective disorder, depressed, severe [F31.4] 04/07/2014  . Suicidal ideations [R45.851] 04/07/2014  . Alcohol abuse, episodic [F10.10] 04/07/2014  . Alcohol dependence with alcohol-induced mood disorder [F10.24]   . Bipolar I disorder, most recent episode mixed [F31.60] 08/31/2013  . Anxiety state, unspecified [F41.1] 08/31/2013  . Bipolar 1 disorder [F31.9] 08/30/2013  . 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 Diarrhea [R19.7] 01/30/2013  . Postop check [Z09] 01/30/2013  . Abdominal pain [R10.9] 12/18/2012  . Family history of cardiac disorder [Z82.49] 09/20/2012  . Excessive sweating, local [L74.519] 09/20/2012  . Compulsive tobacco user syndrome [F17.200] 09/20/2012    Musculoskeletal: Strength & Muscle Tone: within normal limits Gait & Station: normal Patient leans: N/A  Psychiatric Specialty Exam: Physical Exam  Psychiatric: He has a normal mood and affect. His speech is normal and behavior is normal. Judgment and thought content normal. Cognition and memory are normal.    Review of Systems  Constitutional: Negative.   HENT: Negative.   Eyes: Negative.   Respiratory: Negative.   Cardiovascular: Negative.   Gastrointestinal: Negative.   Genitourinary: Negative.   Musculoskeletal: Negative.   Skin:  Negative.   Neurological: Negative.   Endo/Heme/Allergies: Negative.   Psychiatric/Behavioral: Positive for substance abuse (Positive for cocaine and alcohol prior to admission. ). Negative for depression, suicidal ideas, hallucinations and memory loss. The patient is not nervous/anxious and does not have insomnia.     Blood pressure 103/71, pulse 61, temperature 97.7 F (36.5 C), temperature source Oral, resp. rate 20, height '5\' 8"'  (1.727 m), weight 78.472 kg (173 lb).Body mass index is 26.31 kg/(m^2).  See Physician SRA     Past Medical History:  Past Medical History  Diagnosis Date  . Bipolar 1 disorder   . Anxiety   . Stroke     Past Surgical History  Procedure Laterality Date  . Finger surgery    . Cholecystectomy N/A 12/24/2012    Procedure: LAPAROSCOPIC CHOLECYSTECTOMY;  Surgeon: Gwenyth Ober, MD;  Location: [facility];  Service: General;  Laterality: N/A;   Family History:  Family History  Problem Relation Age of Onset  . Heart disease Mother    Social History:  History  Alcohol Use  . 12.0 oz/week  . 20 Cans of beer per week    Comment: 3 24oz beers every other day     History  Drug Use No    History   Social History  . Marital Status: Single    Spouse Name: N/A  . Number of Children: N/A  . Years of Education: N/A   Social History Main Topics  . Smoking status: Current Every Day Smoker -- 1.00 packs/day for 5 years    Types: Cigarettes  . Smokeless tobacco: Not on file  . Alcohol Use: 12.0 oz/week    20 Cans of beer per week     Comment: 3 24oz beers every other day  .  Drug Use: No  . Sexual Activity: No   Other Topics Concern  . None   Social History Narrative    Past Psychiatric History: Hospitalizations:  Outpatient Care:  Substance Abuse Care:  Self-Mutilation:  Suicidal Attempts:  Violent Behaviors:   Risk to Self: Is patient at risk for suicide?: Yes What has been your use of drugs/alcohol within the last 12 months?: Patient reports  dirnking a six pack of  beer daily.  He also reports smoking two blunts daily.  He advised of relapsing last week on crack cocaine after two years of clean time Risk to Others:   Prior Inpatient Therapy:   Prior Outpatient Therapy:    Level of Care:  OP  Hospital Course:  [name] is a 44 year old Caucasian male. Admitted from the [facility] with complaints of suicidal ideations. He reports, "The ambulance brought me to the hospital. I called them because I was feeling suicidal. I have bipolar disorder, recently relapsed on Cocaine/alcohol. My suicidal ideation started 4 days ago. I'm having some bad family issues. It is about my parents. Their health is going down. I got frustrated, stopped taking my medicines 3 days ago. I started drinking & drugging. I was drinking 6 packs of beer & a gram of crack daily x 4 days. I was using & drinking because I was stressed. I need drug & alcohol rehab after discharge. I'm still suicidal. I'm hearing this voice telling me to 'kill yourself'. That is why I attempted suicide by smoking a lot of crack. I had attempted suicide in the past by overdose. [facility]'m with [facility]. They were at the house 3 days ago to give me my Abilify injection. I need to get back on all my medicines, I don't remember all their names".         [name] was admitted to the adult unit. He was evaluated and his symptoms were identified. Medication management was discussed and initiated. He completed an Ativan taper for alcohol detox. The patient was started on Lamictal 25 mg daily for improved mood stability. He received his injection of Abilify due every thirty days on 04/07/14. Patient was prescribed Campral 666 mg three times daily to help decrease his cravings for alcohol after discharge. He was oriented to the unit and encouraged to participate in unit programming. Medical problems were identified and treated appropriately. Home medication was restarted  as needed.        The patient was evaluated each day by a clinical provider to ascertain the patient's response to treatment.  Improvement was noted by the patient's report of decreasing symptoms, improved sleep and appetite, affect, medication tolerance, behavior, and participation in unit programming.  He was asked each day to complete a self inventory noting mood, mental status, pain, new symptoms, anxiety and concerns.         He responded well to medication and being in a therapeutic and supportive environment. Positive and appropriate behavior was noted and the patient was motivated for recovery.  The patient worked closely with the treatment team and case manager to develop a discharge plan with appropriate goals. Coping skills, problem solving as well as relaxation therapies were also part of the unit programming.         By the day of discharge he was in much improved condition than upon admission.  Symptoms were reported as significantly decreased or resolved completely. The patient denied SI/HI and voiced no AVH.  He was motivated to continue taking medication with a goal of continued improvement in mental health. [name] was discharged home with a plan to follow up as noted below. The patient was provided with sample medications and prescriptions at time of discharge. He left [facility] in stable condition with all belongings returned to him.   Consults:  None  Significant Diagnostic Studies:  Chemistry panel, CBC, UDS positive for cocaine   Discharge Vitals:   Blood pressure 103/71, pulse 61, temperature 97.7 F (36.5 C), temperature source Oral, resp. rate 20, height '5\' 8"'  (1.727 m), weight 78.472 kg (173 lb). Body mass index is 26.31 kg/(m^2). Lab Results:   Results for orders placed or performed during the hospital encounter of 04/07/14 (from the past 72 hour(s))  Acetaminophen level     Status: Abnormal   Collection Time: 04/07/14  3:40 AM  Result Value Ref Range   Acetaminophen  (Tylenol), Serum <10.0 (L) 10 - 30 ug/mL    Comment:        THERAPEUTIC CONCENTRATIONS VARY SIGNIFICANTLY. A RANGE OF 10-30 ug/mL MAY BE AN EFFECTIVE CONCENTRATION FOR MANY PATIENTS. HOWEVER, SOME ARE BEST TREATED AT CONCENTRATIONS OUTSIDE THIS RANGE. ACETAMINOPHEN CONCENTRATIONS >150 ug/mL AT 4 HOURS AFTER INGESTION AND >50 ug/mL AT 12 HOURS AFTER INGESTION ARE OFTEN ASSOCIATED WITH TOXIC REACTIONS.   CBC     Status: None   Collection Time: 04/07/14  3:40 AM  Result Value Ref Range   WBC 8.1 4.0 - 10.5 K/uL   RBC 5.05 4.22 - 5.81 MIL/uL   Hemoglobin 16.7 13.0 - 17.0 g/dL   HCT 47.6 39.0 - 52.0 %   MCV 94.3 78.0 - 100.0 fL   MCH 33.1 26.0 - 34.0 pg   MCHC 35.1 30.0 - 36.0 g/dL   RDW 12.8 11.5 - 15.5 %   Platelets 266 150 - 400 K/uL  Comprehensive metabolic panel     Status: Abnormal   Collection Time: 04/07/14  3:40 AM  Result Value Ref Range   Sodium 138 135 - 145 mmol/L   Potassium 3.9 3.5 - 5.1 mmol/L   Chloride 104 96 - 112 mmol/L   CO2 24 19 - 32 mmol/L   Glucose, Bld 115 (H) 70 - 99 mg/dL   BUN 8 6 - 23 mg/dL   Creatinine, Ser 0.82 0.50 - 1.35 mg/dL   Calcium 9.4 8.4 - 10.5 mg/dL   Total Protein 7.9 6.0 - 8.3 g/dL   Albumin 4.4 3.5 - 5.2 g/dL   AST 24 0 - 37 U/L   ALT 34 0 - 53 U/L   Alkaline Phosphatase 76 39 - 117 U/L   Total Bilirubin 1.0 0.3 - 1.2 mg/dL   GFR calc non Af Amer >90 >90 mL/min   GFR calc Af Amer >90 >90 mL/min    Comment: (NOTE) The eGFR has been calculated using the CKD EPI equation. This calculation has not been validated in all clinical situations. eGFR's persistently <90 mL/min signify possible Chronic Kidney Disease.    Anion gap 10 5 - 15  Ethanol (ETOH)     Status: None   Collection Time: 04/07/14  3:40 AM  Result Value Ref Range   Alcohol, Ethyl (B) <5 0 - 9 mg/dL    Comment:        LOWEST DETECTABLE LIMIT FOR SERUM ALCOHOL IS 11 mg/dL FOR MEDICAL PURPOSES ONLY   Salicylate level     Status: None   Collection Time:  04/07/14  3:40 AM  Result Value Ref Range   Salicylate Lvl <6.1 2.8 - 20.0 mg/dL  Urine Drug Screen     Status: Abnormal   Collection Time: 04/07/14  3:45 AM  Result Value Ref Range   Opiates NONE DETECTED NONE DETECTED   Cocaine POSITIVE (A) NONE DETECTED   Benzodiazepines NONE DETECTED NONE DETECTED   Amphetamines NONE DETECTED NONE DETECTED   Tetrahydrocannabinol NONE DETECTED NONE DETECTED   Barbiturates NONE DETECTED NONE DETECTED    Comment:        DRUG SCREEN FOR MEDICAL PURPOSES ONLY.  IF CONFIRMATION IS NEEDED FOR ANY PURPOSE, NOTIFY LAB WITHIN 5 DAYS.        LOWEST DETECTABLE LIMITS FOR URINE DRUG SCREEN Drug Class       Cutoff (ng/mL) Amphetamine      1000 Barbiturate      200 Benzodiazepine   950 Tricyclics       932 Opiates          300 Cocaine          300 THC              50     Physical Findings: AIMS: Facial and Oral Movements Muscles of Facial Expression: None, normal Lips and Perioral Area: None, normal Jaw: None, normal Tongue: None, normal,Extremity Movements Upper (arms, wrists, hands, fingers): None, normal Lower (legs, knees, ankles, toes): None, normal, Trunk Movements Neck, shoulders, hips: None, normal, Overall Severity Severity of abnormal movements (highest score from questions above): None, normal Incapacitation due to abnormal movements: None, normal Patient's awareness of abnormal movements (rate only patient's report): No Awareness, Dental Status Current problems with teeth and/or dentures?: No Does patient usually wear dentures?: No  CIWA:  CIWA-Ar Total: 1 COWS:  COWS Total Score: 1   See Psychiatric Specialty Exam and Suicide Risk Assessment completed by Attending Physician prior to discharge.  Discharge destination:  Home  Is patient on multiple antipsychotic therapies at discharge:  No   Has Patient had three or more failed trials of antipsychotic monotherapy by history:  No  Recommended Plan for Multiple Antipsychotic  Therapies: NA     Medication List    STOP taking these medications        paliperidone 3 MG 24 hr tablet  Commonly known as:  INVEGA      TAKE these medications      Indication   acamprosate 333 MG tablet  Commonly known as:  CAMPRAL  Take 2 tablets (666 mg total) by mouth 3 (three) times daily with meals.   Indication:  Excessive Use of Alcohol     albuterol 108 (90 BASE) MCG/ACT inhaler  Commonly known as:  PROVENTIL HFA;VENTOLIN HFA  Inhale 2 puffs into the lungs every 6 (six) hours as needed for wheezing or shortness of breath.      ARIPiprazole 9.75 MG/1.3ML injection  Commonly known as:  ABILIFY  Inject 0.7 mLs (5.25 mg total) into the muscle every 30 (thirty) days. Next due 05/07/14  Start taking on:  05/07/2014   Indication:  Bipolar Mania     FLUoxetine 20 MG capsule  Commonly known as:  PROZAC  Take 1 capsule (20 mg total) by mouth daily.   Indication:  Excessive Use of Alcohol, Depression     gabapentin 100 MG capsule  Commonly known as:  NEURONTIN  Take 2 capsules (200 mg total) by mouth 3 (three) times daily.   Indication:  Aggressive Behavior, Alcohol Withdrawal Syndrome     lamoTRIgine 25  MG tablet  Commonly known as:  LAMICTAL  Take 1 tablet (25 mg total) by mouth daily.   Indication:  Manic-Depression     multivitamin with minerals Tabs tablet  Take 1 tablet by mouth daily.   Indication:  Vitamin Supplementation     multivitamin with minerals Tabs tablet  Take 1 tablet by mouth daily.   Indication:  Vitamin Supplementation     traZODone 100 MG tablet  Commonly known as:  DESYREL  Take 1 tablet (100 mg total) by mouth at bedtime.   Indication:  Alcohol Withdrawal Syndrome, Excessive Use of Alcohol, Trouble Sleeping       Follow-up Information    Follow up with [facility].   Why:  Colletta Maryland will pick patient at [facility] today   Contact information:   [address], [place]   [id number]  [phone]       Follow-up recommendations:   Activity: As tolerated Diet: Regular Tests: NA Other: See below  Comments:   Take all your medications as prescribed by your mental healthcare provider.  Report any adverse effects and or reactions from your medicines to your outpatient provider promptly.  Patient is instructed and cautioned to not engage in alcohol and or illegal drug use while on prescription medicines.  In the event of worsening symptoms, patient is instructed to call the crisis hotline, 911 and or go to the nearest ED for appropriate evaluation and treatment of symptoms.  Follow-up with your primary care provider for your other medical issues, concerns and or health care needs.   Total Discharge Time: Greater than 30 minutes  Signed: DAVIS, LAURA NP-C 04/09/2014, 7:17 PM   Patient seen, Suicide Assessment Completed.  Disposition Plan Reviewed

## 2014-04-09 NOTE — [facility] Group Notes (Signed)
[facility] LCSW Group Therapy  Feelings Around Diagnosis - 1:15 - 2:30 PM  04/09/2014 3:01 PM  Type of Therapy:  Group Therapy  Participation Level:  Did Not Attend - patient waiting to discharge.  Wynn BankerHodnett, Ebba Goll Hairston 04/09/2014, 3:01 PM

## 2014-04-09 NOTE — Progress Notes (Signed)
Pt attended spiritual care group on grief and loss facilitated by chaplain Burnis KingfisherMatthew Stalnaker and counseling intern [name]. Group opened with brief discussion and psycho-social ed around grief and loss in relationships and in relation to self - identifying life patterns, circumstances, changes that cause losses. Established group norm of speaking from own life experience. Group goal of establishing open and affirming space for members to share loss and experience with grief, normalize grief experience and provide psycho social education and grief support.  Group drew on narrative and Alderian therapeutic modalities.   [name] was present for half of the group. [name] did not engage with other group members or facilitators around topic, but presented as calm and attentive.   [name] Counseling Intern

## 2014-04-09 NOTE — Progress Notes (Signed)
Discharge Note:  Patient discharged home.  Denied SI and HI.  Denied A/V hallucinations.  Patient stated he received all his belongings, clothing, toiletries, misc items, prescriptions, medications, tobogan, belt, pants, socks, cigarettes, lighter, cell phones, jacket. Flash light, gloves, tobacco.  Suicide prevention information given and discussed with patient who stated he understood and had no questions.  [facility]

## 2014-04-09 NOTE — Progress Notes (Addendum)
Encouraged patient to go to groups.  Patient stated he has been going to groups.  Requested nicotine patch and albuterol this morning.  No signs/symptoms of shortness of breath, wheezing, coughing.  Again, encouraged patient to attend all groups.  D:  Patient's self inventory sheet, patient slept good last night, sleep medication was helpful.  Good appetite, normal energy level, good concentration.  Denied hopeless, anxiety, depression.  Stated he continues to experience cravings, agitation, nausea, irritability.  Denied SI.  Denied physical problems.  Goal is to go home.  Plans to talk to MD.   A:  Medications administered per MD orders.  Emotional support and encouragement given patient. R:  Denied SI and HI, contracts for safety.  Denied A/V hallucinations.

## 2014-04-09 NOTE — Progress Notes (Signed)
  [facility] Discharge Plan :  Will you be returning to the same living situation after discharge:  Yes,  Patient is returning home with family At discharge, do you have transportation home?: Yes,  Patient to be transported home by [facility] Team Do you have the ability to pay for your medications: Yes,  Patient is able to obtain medications.  Release of information consent forms completed and in the chart;  Patient's signature needed at discharge.  Patient to Follow up at: Follow-up Information    Follow up with [facility].   Why:  Judeth CornfieldStephanie will pick patient at [facility] today   Contact information:   [address] [place], [place]   [id number]  [phone]      Patient denies SI/HI:  Patient no longer endorsing SI/HI or other thoughts of self harm.     Safety Planning and Suicide Prevention discussed: .Reviewed with all patients during discharge planning group  Have you used any form of tobacco in the last 30 days? (Cigarettes, Smokeless Tobacco, Cigars, and/or Pipes): Yes  Has patient been referred to the Quitline?  Patient declined referral to Quitline.  Wynn BankerHodnett, Jumaane Weatherford Hairston 04/09/2014, 11:26 AM

## 2014-04-09 NOTE — [facility] Suicide Risk Assessment (Signed)
[facility]   Demographic Factors:  44 year old man, single, lives with mother, on disability.  Total Time spent with patient: 30 minutes  Musculoskeletal: Strength & Muscle Tone: within normal limits Gait & Station: normal Patient leans: N/A  Psychiatric Specialty Exam: Physical Exam  ROS  Blood pressure 103/71, pulse 61, temperature 97.7 F (36.5 C), temperature source Oral, resp. rate 20, height 5\' 8"  (1.727 m), weight 173 lb (78.472 kg).Body mass index is 26.31 kg/(m^2).  General Appearance: improved grooming  Eye Contact::  Good  Speech:  improved  Volume:  Normal  Mood:  Euthymic  Affect:  Appropriate  Thought Process:  Linear  Orientation:  Other:  alert and attentive  Thought Content:  no hallucinations, no delusions  Suicidal Thoughts:  No- denies any thoughts of hurting self or anyone else   Homicidal Thoughts:  No  Memory:  recent and remote grossly intact   Judgement:  Fair  Insight:  limited  Psychomotor Activity:  Normal- no tremors , no diaphoresis  Concentration:  Fair  Recall:  Good  Fund of Knowledge:Good  Language: Good  Akathisia:  NA  Handed:  Right  AIMS (if indicated):     Assets:  Desire for Improvement Housing Resilience Social Support  Sleep:  Number of Hours: 6.75  Cognition:  Alert and attentive  ADL's:  Fair, but improving   Have you used any form of tobacco in the last 30 days? (Cigarettes, Smokeless Tobacco, Cigars, and/or Pipes): Yes  Has this patient used any form of tobacco in the last 30 days? (Cigarettes, Smokeless Tobacco, Cigars, and/or Pipes) Yes, A prescription for an FDA-approved tobacco cessation medication was offered at discharge and the patient refused  Mental Status Per Nursing Assessment::   On Admission:  Suicidal ideation indicated by patient  Current Mental Status by Physician: At this time patient is improved compared to his admission. He presents calm, pleasant, not agitated, mood is  described as "OK", normal. Denies depression at present. Affect appropriate, no thought disorder, no SI , no HI, no hallucinations. No tremors, no diaphoresis, no psychomotor restlessness, does not appear to be in any WDL at this time  Loss Factors: On disability, limited sober support system  Historical Factors: prior history of mood disorder, history of suicide attempt by overdose, history of  several prior psychiatric admissions  Risk Reduction Factors:   Sense of responsibility to family and Living with another person, especially a relative  Continued Clinical Symptoms:  As noted above, at this time much improved, with improved mood, range of affect, no SI or HI, no thought disorder, no psychosis, future oriented. No current withdrawal symptoms. He is future oriented,  For example, [place] his [place] in the next few months. * Of note, patient is expressing interest in Campral trial after discharge . We discussed side effect profile and rationale.  Cognitive Features That Contribute To Risk:  Closed-mindedness    Suicide Risk:  Mild:  Suicidal ideation of limited frequency, intensity, duration, and specificity.  There are no identifiable plans, no associated intent, mild dysphoria and related symptoms, good self-control (both objective and subjective assessment), few other risk factors, and identifiable protective factors, including available and accessible social support.  Principal Problem: Alcohol Dependence, Cocaine Abuse, history of Bipolar Disorder  Discharge Diagnoses:  Patient Active Problem List   Diagnosis Date Noted  . Cocaine abuse with cocaine-induced mood disorder [F14.14] 04/08/2014  . Alcohol dependence with uncomplicated  withdrawal [F10.230] 04/07/2014  . Bipolar affective disorder, depressed, severe [F31.4] 04/07/2014  . Suicidal ideations [R45.851] 04/07/2014  . Alcohol abuse, episodic [F10.10] 04/07/2014  . Alcohol dependence  with alcohol-induced mood disorder [F10.24]   . Bipolar I disorder, most recent episode mixed [F31.60] 08/31/2013  . Anxiety state, unspecified [F41.1] 08/31/2013  . Bipolar 1 disorder [F31.9] 08/30/2013  . 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 Diarrhea [R19.7] 01/30/2013  . Postop check [Z09] 01/30/2013  . Abdominal pain [R10.9] 12/18/2012  . Family history of cardiac disorder [Z82.49] 09/20/2012  . Excessive sweating, local [L74.519] 09/20/2012  . Compulsive tobacco user syndrome [F17.200] 09/20/2012    Follow-up Information    Follow up with [facility].   Why:  Judeth Cornfield will pick patient at [facility] today   Contact information:   [address] [place], [place]   [address]  [phone]      Plan Of Care/Follow-up recommendations:  Activity:  As tolerated Diet:  Regular Tests:  NA Other:  See below  Is patient on multiple antipsychotic therapies at discharge:  No   Has Patient had three or more failed trials of antipsychotic monotherapy by history:  No  Recommended Plan for Multiple Antipsychotic Therapies: NA   Patient has requested discharge and there are currently no grounds for involuntary commitment. He is leaving unit in good spirits. He will be living with his mother. He is followed up by ACT Team- [facility].     COBOS, FERNANDO 04/09/2014, 11:47 AM

## 2014-04-11 NOTE — Progress Notes (Signed)
Patient Discharge Instructions:  After Visit Summary (AVS):   Faxed to:  04/11/14 Discharge Summary Note:   Faxed to:  04/11/14 Psychiatric Admission Assessment Note:   Faxed to:  04/11/14 Suicide Risk Assessment - Discharge Assessment:   Faxed to:  04/11/14 Faxed/Sent to the Next Level Care provider:  04/11/14 Faxed to [facility] @ [phone]  Jerelene ReddenSheena E Cameron, 04/11/2014, 4:01 PM

## 2017-04-09 ENCOUNTER — Emergency Department (HOSPITAL_COMMUNITY)
Admission: EM | Admit: 2017-04-09 | Discharge: 2017-04-11 | Disposition: A | Discharge status: Other Acute Inpatient Hospital | Payer: Self-pay | Attending: Emergency Medicine | Admitting: Emergency Medicine

## 2017-04-09 ENCOUNTER — Encounter (HOSPITAL_COMMUNITY): Payer: Self-pay | Admitting: Emergency Medicine

## 2017-04-09 DIAGNOSIS — F250 Schizoaffective disorder, bipolar type: Secondary | ICD-10-CM | POA: Diagnosis present

## 2017-04-09 DIAGNOSIS — Z8673 Personal history of transient ischemic attack (TIA), and cerebral infarction without residual deficits: Secondary | ICD-10-CM | POA: Insufficient documentation

## 2017-04-09 DIAGNOSIS — F17210 Nicotine dependence, cigarettes, uncomplicated: Secondary | ICD-10-CM | POA: Insufficient documentation

## 2017-04-09 DIAGNOSIS — F315 Bipolar disorder, current episode depressed, severe, with psychotic features: Secondary | ICD-10-CM | POA: Insufficient documentation

## 2017-04-09 DIAGNOSIS — F319 Bipolar disorder, unspecified: Secondary | ICD-10-CM | POA: Diagnosis present

## 2017-04-09 DIAGNOSIS — F314 Bipolar disorder, current episode depressed, severe, without psychotic features: Secondary | ICD-10-CM

## 2017-04-09 DIAGNOSIS — F22 Delusional disorders: Secondary | ICD-10-CM

## 2017-04-09 LAB — COMPREHENSIVE METABOLIC PANEL
ALT: 32 U/L (ref 17–63)
AST: 20 U/L (ref 15–41)
Albumin: 4.4 g/dL (ref 3.5–5.0)
Alkaline Phosphatase: 82 U/L (ref 38–126)
Anion gap: 11 (ref 5–15)
BUN: 7 mg/dL (ref 6–20)
CO2: 23 mmol/L (ref 22–32)
CREATININE: 0.78 mg/dL (ref 0.61–1.24)
Calcium: 8.9 mg/dL (ref 8.9–10.3)
Chloride: 106 mmol/L (ref 101–111)
GFR calc non Af Amer: >60 mL/min (ref >60)
Glucose, Bld: 105 mg/dL — ABNORMAL HIGH (ref 65–99)
Potassium: 3.2 mmol/L — ABNORMAL LOW (ref 3.5–5.1)
SODIUM: 140 mmol/L (ref 135–145)
Total Bilirubin: 0.5 mg/dL (ref 0.3–1.2)
Total Protein: 7.9 g/dL (ref 6.5–8.1)

## 2017-04-09 LAB — ETHANOL: Alcohol, Ethyl (B): <10 mg/dL (ref <10)

## 2017-04-09 LAB — CBC
HCT: 42 % (ref 39.0–52.0)
Hemoglobin: 15.1 g/dL (ref 13.0–17.0)
MCH: 32.8 pg (ref 26.0–34.0)
MCHC: 36 g/dL (ref 30.0–36.0)
MCV: 91.3 fL (ref 78.0–100.0)
PLATELETS: 261 10*3/uL (ref 150–400)
RBC: 4.6 MIL/uL (ref 4.22–5.81)
RDW: 13 % (ref 11.5–15.5)
WBC: 9.1 10*3/uL (ref 4.0–10.5)

## 2017-04-09 LAB — RAPID URINE DRUG SCREEN, HOSP PERFORMED
Amphetamines: NOT DETECTED
Barbiturates: NOT DETECTED
Benzodiazepines: NOT DETECTED
Cocaine: NOT DETECTED
OPIATES: NOT DETECTED
Tetrahydrocannabinol: NOT DETECTED

## 2017-04-09 LAB — SALICYLATE LEVEL

## 2017-04-09 LAB — ACETAMINOPHEN LEVEL: Acetaminophen (Tylenol), Serum: <10 ug/mL — ABNORMAL LOW (ref 10–30)

## 2017-04-09 MED ORDER — POTASSIUM CHLORIDE CRYS ER 20 MEQ PO TBCR
20.0000 meq | EXTENDED_RELEASE_TABLET | Freq: Once | ORAL | Status: AC
  Administered 2017-04-09: 20 meq via ORAL
  Filled 2017-04-09: qty 1

## 2017-04-09 NOTE — ED Notes (Signed)
Pt A&O x 3, no distress noted, guarded and anxious but cooperative.  Pt out of prison x 3 mos, off meds.  IVC papers report pt with AVH and SI.  Pt not forthcoming with information, requesting we read the paperwork.  Monitoring for safety, Q 15 min checks in effect.

## 2017-04-09 NOTE — ED Triage Notes (Signed)
Patient brought in IVC by [facility]heriff. Hx of bipolar disorder. Recently released from prison and have been off medication  Since released from prison (3 months). No eating or sleeping. Reports patient is hearing voices and seeing people who aren't there. Patient denies SI/HI. Denies A/V hallucinations.

## 2017-04-09 NOTE — BH Assessment (Signed)
[facility] Assessment Progress Note  Consulted with Nira ConnJason Berry, NP who recommends inpt treatment. TTS advised  EDP Gerhard MunchLockwood, Robert, MD of the reccomendation. Triage tech Josh notified of the disposition. TTS to seek placement. [facility].   Princess BruinsAquicha Arika Mainer, MSW, LCSW Therapeutic Triage Specialist  [phone]

## 2017-04-09 NOTE — ED Notes (Signed)
Bed: [facility] Expected date:  Expected time:  Means of arrival:  Comments: 
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
## 2017-04-09 NOTE — ED Notes (Signed)
TTS at bedside to assess pt.  

## 2017-04-09 NOTE — ED Provider Notes (Signed)
[facility]-EMERGENCY DEPT Provider Note   CSN: [id number] Arrival date & time: 04/09/17  1734     History   Chief Complaint Chief Complaint  Patient presents with  . IVC  . Medical Clearance    HPI [name] is a 47 y.o. male.  HPI  Patient presents under involuntary commitment. The patient himself is withdrawn, not very interactive, hesitant to offer details of his presentation. Eventually it seems that the patient is concerned about people trying to hurt him and his family, states that they are in grave danger, specifies an unknown"them"multiple times, when indicated who is threatening him and his family. He notes that he has not taken his medication for bipolar disorder or anything else in a long time, he was recently incarcerated.  On himself denies any physical pain or complaints. He is here with a Emergency planning/management officerpolice officer as an escort.   Past Medical History:  Diagnosis Date  . Anxiety   . Bipolar 1 disorder (HCC)   . Stroke [facility](HCC)     Patient Active Problem List   Diagnosis Date Noted  . Cocaine abuse with cocaine-induced mood disorder (HCC) 04/08/2014  . Alcohol dependence with uncomplicated withdrawal (HCC) 04/07/2014  . Bipolar affective disorder, depressed, severe (HCC) 04/07/2014  . Suicidal ideations 04/07/2014  . Alcohol abuse, episodic 04/07/2014  . Alcohol dependence with alcohol-induced mood disorder (HCC)   . Bipolar I disorder, most recent episode mixed (HCC) 08/31/2013  . Anxiety state, unspecified 08/31/2013  . Bipolar 1 disorder (HCC) 08/30/2013  . Alcohol-induced mood disorder (HCC) 08/29/2013  . Diarrhea 01/30/2013  . Postop check 01/30/2013  . Abdominal pain 12/18/2012  . Family history of cardiac disorder 09/20/2012  . Excessive sweating, local 09/20/2012  . Compulsive tobacco user syndrome 09/20/2012    Past Surgical History:  Procedure Laterality Date  . CHOLECYSTECTOMY N/A 12/24/2012   Procedure: LAPAROSCOPIC  CHOLECYSTECTOMY;  Surgeon: Cherylynn RidgesJames O Wyatt, MD;  [facility];  Service: General;  Laterality: N/A;  . FINGER SURGERY         Home Medications    Prior to Admission medications   Not on File    Family History Family History  Problem Relation Age of Onset  . Heart disease Mother     Social History Social History   Tobacco Use  . Smoking status: Current Every Day Smoker    Packs/day: 1.00    Years: 5.00    Pack years: 5.00    Types: Cigarettes  . Smokeless tobacco: Never Used  Substance Use Topics  . Alcohol use: Yes    Alcohol/week: 12.0 oz    Types: 20 Cans of beer per week    Comment: 3 24oz beers every other day  . Drug use: No     Allergies   Patient has no known allergies.   Review of Systems Review of Systems  Unable to perform ROS: Psychiatric disorder     Physical Exam Updated Vital Signs BP 117/87 (BP Location: Left Arm)   Pulse 75   Temp 98.2 F (36.8 C) (Oral)   Resp 18   SpO2 100%   Physical Exam  Constitutional: He is oriented to person, place, and time. He appears well-developed. No distress.  HENT:  Head: Normocephalic and atraumatic.  Eyes: Conjunctivae and EOM are normal.  Cardiovascular: Normal rate and regular rhythm.  Pulmonary/Chest: Effort normal. No stridor. No respiratory distress.  Abdominal: He exhibits no distension.  Musculoskeletal: He exhibits no edema.  Neurological: He is  alert and oriented to person, place, and time.  Skin: Skin is warm and dry.  Psychiatric: His mood appears anxious. His speech is delayed. He is slowed. Thought content is paranoid and delusional. Cognition and memory are impaired.  Nursing note and vitals reviewed.    ED Treatments / Results  Labs (all labs ordered are listed, but only abnormal results are displayed) Labs Reviewed  COMPREHENSIVE METABOLIC PANEL - Abnormal; Notable for the following components:      Result Value   Potassium 3.2 (*)    Glucose, Bld 105 (*)    All other  components within normal limits  ACETAMINOPHEN LEVEL - Abnormal; Notable for the following components:   Acetaminophen (Tylenol), Serum <10 (*)    All other components within normal limits  ETHANOL  SALICYLATE LEVEL  CBC  RAPID URINE DRUG SCREEN, HOSP PERFORMED     Procedures Procedures (including critical care time)  Medications Ordered in ED Medications - No data to display   Initial Impression / Assessment and Plan / ED Course  I have reviewed the triage vital signs and the nursing notes.  Pertinent labs & imaging results that were available during my care of the patient were reviewed by me and considered in my medical decision making (see chart for details).     This patient presents with the need for medical evaluation due to ongoing psychiatric condition.  The patient's medical portion of the evaluation is generally reassuring, with no evidence of acute new pathology.  The patient has been medically cleared for further psychiatric evaluation.  Final Clinical Impressions(s) / ED Diagnoses   Final diagnoses:  Paranoid delusion [facility])      Gerhard Munch, MD 04/09/17 2035

## 2017-04-10 DIAGNOSIS — F419 Anxiety disorder, unspecified: Secondary | ICD-10-CM

## 2017-04-10 DIAGNOSIS — F312 Bipolar disorder, current episode manic severe with psychotic features: Secondary | ICD-10-CM

## 2017-04-10 DIAGNOSIS — R450 Nervousness: Secondary | ICD-10-CM

## 2017-04-10 DIAGNOSIS — F1414 Cocaine abuse with cocaine-induced mood disorder: Secondary | ICD-10-CM

## 2017-04-10 DIAGNOSIS — F10230 Alcohol dependence with withdrawal, uncomplicated: Secondary | ICD-10-CM

## 2017-04-10 DIAGNOSIS — F17210 Nicotine dependence, cigarettes, uncomplicated: Secondary | ICD-10-CM

## 2017-04-10 DIAGNOSIS — G4700 Insomnia, unspecified: Secondary | ICD-10-CM

## 2017-04-10 DIAGNOSIS — R45851 Suicidal ideations: Secondary | ICD-10-CM

## 2017-04-10 MED ORDER — GABAPENTIN 300 MG PO CAPS
300.0000 mg | ORAL_CAPSULE | Freq: Two times a day (BID) | ORAL | Status: DC
  Administered 2017-04-10 – 2017-04-11 (×2): 300 mg via ORAL
  Filled 2017-04-10 (×3): qty 1

## 2017-04-10 MED ORDER — HALOPERIDOL 2 MG PO TABS
2.0000 mg | ORAL_TABLET | Freq: Two times a day (BID) | ORAL | Status: DC
Start: 2017-04-10 — End: 2017-04-10
  Filled 2017-04-10: qty 1

## 2017-04-10 MED ORDER — HALOPERIDOL 0.5 MG PO TABS
2.0000 mg | ORAL_TABLET | Freq: Two times a day (BID) | ORAL | Status: DC
  Administered 2017-04-10 – 2017-04-11 (×2): 2 mg via ORAL
  Filled 2017-04-10 (×3): qty 4

## 2017-04-10 NOTE — Progress Notes (Signed)
Ref[facility] pt [facility] the following hospitals [facility]   [facility]

## 2017-04-10 NOTE — BH Assessment (Signed)
[facility] Assessment Progress Note      Dr. Akintayo recommends inpatient placement.   

## 2017-04-10 NOTE — ED Notes (Signed)
PT STATES WE ARE NOT PUMPING MEDS INTO HIM TWICE A DAY, REFUSED NIGHT TIME MEDS.

## 2017-04-10 NOTE — Consult Note (Addendum)
[facility] Face-to-Face Psychiatry Consult   Reason for Consult:  Involuntary Committment Referring Physician:  EDP Patient Identification: [name] MRN:  [id number] Principal Diagnosis: Bipolar I disorder, current or most recent episode manic, with psychotic features [facility]) Diagnosis:   Patient Active Problem List   Diagnosis Date Noted  . Cocaine abuse with cocaine-induced mood disorder ([place]) [F14.14] 04/08/2014  . Alcohol dependence with uncomplicated withdrawal ([place]) [F10.230] 04/07/2014  . Bipolar affective disorder, depressed, severe ([place]) [F31.4] 04/07/2014  . Suicidal ideations [R45.851] 04/07/2014  . Alcohol abuse, episodic [F10.10] 04/07/2014  . Alcohol dependence with alcohol-induced mood disorder ([place]) [F10.24]   . Bipolar I disorder, most recent episode mixed ([place]) [F31.60] 08/31/2013  . Anxiety state, unspecified [F41.1] 08/31/2013  . Bipolar I disorder, current or most recent episode manic, with psychotic features ([place]) [F31.2] 08/30/2013  . Alcohol-induced mood disorder ([place]) [F10.94] 08/29/2013  . Diarrhea [R19.7] 01/30/2013  . Postop check [Z09] 01/30/2013  . Abdominal pain [R10.9] 12/18/2012  . Family history of cardiac disorder [Z82.49] 09/20/2012  . Excessive sweating, local [L74.519] 09/20/2012  . Compulsive tobacco user syndrome [F17.200] 09/20/2012    Total Time spent with patient: 45 minutes  Subjective:   [name] is a 47 y.o. male patient admitted under involuntary commitment, placed by his family because he was acting bizarrely at home.  HPI:  Pt was seen and chart reviewed with treatment team and Dr Darleene Cleaver. Pt stated he was placed in the hospital because his family doesn't want to tell the truth but he wants to tell the truth. Pt would not specify what the truth is about. Pt is agitated and angry and guarded with his information. Pt stated he does not sleep at home because he has to stay up all night and watch his family so they won't do anything to  him. Pt is paranoid. Pt was recently released from prison and has been off his antipsychotic medications for approximately one year. Pt is easily irritated and frustrated by being asked questions. Pt's UDS and BAL negative. Pt would benefit from an inpatient psychiatric admission for crisis stabilization and medication management.   Past Psychiatric History: As above  Risk to Self: Suicidal Ideation: Yes-Currently Present(per IVC, pt denies) Suicidal Intent: No Is patient at risk for suicide?: Yes(per risk factors) Suicidal Plan?: No Access to Means: No What has been your use of drugs/alcohol within the last 12 months?: reports to occasional alcohol use  How many times?: (multiple) Triggers for Past Attempts: Unpredictable Intentional Self Injurious Behavior: None Risk to Others: Homicidal Ideation: No Thoughts of Harm to Others: No Current Homicidal Intent: No Current Homicidal Plan: No Access to Homicidal Means: No History of harm to others?: No Assessment of Violence: None Noted Does patient have access to weapons?: No Criminal Charges Pending?: No Does patient have a court date: No Prior Inpatient Therapy: Prior Inpatient Therapy: Yes Prior Therapy Dates: 2017, 2016, 2015, 2012 Prior Therapy Facilty/Provider(s): [place], [facility] Reason for Treatment: BIPOLAR Prior Outpatient Therapy: Prior Outpatient Therapy: No Does patient have an ACCT team?: No Does patient have Intensive In-House Services?  : No Does patient have [facility] services? : No Does patient have P4CC services?: No  Past Medical History:  Past Medical History:  Diagnosis Date  . Anxiety   . Bipolar 1 disorder ([place])   . Stroke [facility])     Past Surgical History:  Procedure Laterality Date  . CHOLECYSTECTOMY N/A 12/24/2012   Procedure: LAPAROSCOPIC CHOLECYSTECTOMY;  Surgeon: Gwenyth Ober, MD;  Location: [facility] OR;  Service: General;  Laterality: N/A;  . FINGER SURGERY     Family History:  Family History  Problem  Relation Age of Onset  . Heart disease Mother    Family Psychiatric  History: Unknown Social History:  Social History   Substance and Sexual Activity  Alcohol Use Yes  . Alcohol/week: 12.0 oz  . Types: 20 Cans of beer per week   Comment: 3 24oz beers every other day     Social History   Substance and Sexual Activity  Drug Use No    Social History   Socioeconomic History  . Marital status: Single    Spouse name: None  . Number of children: None  . Years of education: None  . Highest education level: None  Social Needs  . Financial resource strain: None  . Food insecurity - worry: None  . Food insecurity - inability: None  . Transportation needs - medical: None  . Transportation needs - non-medical: None  Occupational History  . None  Tobacco Use  . Smoking status: Current Every Day Smoker    Packs/day: 1.00    Years: 5.00    Pack years: 5.00    Types: Cigarettes  . Smokeless tobacco: Never Used  Substance and Sexual Activity  . Alcohol use: Yes    Alcohol/week: 12.0 oz    Types: 20 Cans of beer per week    Comment: 3 24oz beers every other day  . Drug use: No  . Sexual activity: No    Birth control/protection: Condom  Other Topics Concern  . None  Social History Narrative  . None   Additional Social History:    Allergies:  No Known Allergies  Labs:  Results for orders placed or performed during the hospital encounter of 04/09/17 (from the past 48 hour(s))  Rapid urine drug screen (hospital performed)     Status: None   Collection Time: 04/09/17  7:14 PM  Result Value Ref Range   Opiates NONE DETECTED NONE DETECTED   Cocaine NONE DETECTED NONE DETECTED   Benzodiazepines NONE DETECTED NONE DETECTED   Amphetamines NONE DETECTED NONE DETECTED   Tetrahydrocannabinol NONE DETECTED NONE DETECTED   Barbiturates NONE DETECTED NONE DETECTED    Comment: (NOTE) DRUG SCREEN FOR MEDICAL PURPOSES ONLY.  IF CONFIRMATION IS NEEDED FOR ANY PURPOSE, NOTIFY  LAB WITHIN 5 DAYS. LOWEST DETECTABLE LIMITS FOR URINE DRUG SCREEN Drug Class                     Cutoff (ng/mL) Amphetamine and metabolites    1000 Barbiturate and metabolites    200 Benzodiazepine                 086 Tricyclics and metabolites     300 Opiates and metabolites        300 Cocaine and metabolites        300 THC                            50 Performed at [facility], [place] [address], [place], [place] [address]   Comprehensive metabolic panel     Status: Abnormal   Collection Time: 04/09/17  7:23 PM  Result Value Ref Range   Sodium 140 135 - 145 mmol/L   Potassium 3.2 (L) 3.5 - 5.1 mmol/L   Chloride 106 101 - 111 mmol/L   CO2 23 22 - 32 mmol/L  Glucose, Bld 105 (H) 65 - 99 mg/dL   BUN 7 6 - 20 mg/dL   Creatinine, Ser 0.78 0.61 - 1.24 mg/dL   Calcium 8.9 8.9 - 10.3 mg/dL   Total Protein 7.9 6.5 - 8.1 g/dL   Albumin 4.4 3.5 - 5.0 g/dL   AST 20 15 - 41 U/L   ALT 32 17 - 63 U/L   Alkaline Phosphatase 82 38 - 126 U/L   Total Bilirubin 0.5 0.3 - 1.2 mg/dL   GFR calc non Af Amer >60 >60 mL/min   GFR calc Af Amer >60 >60 mL/min    Comment: (NOTE) The eGFR has been calculated using the CKD EPI equation. This calculation has not been validated in all clinical situations. eGFR's persistently <60 mL/min signify possible Chronic Kidney Disease.    Anion gap 11 5 - 15    Comment: Performed at [facility], [place] [address] [place], [place] [address]  Ethanol     Status: None   Collection Time: 04/09/17  7:23 PM  Result Value Ref Range   Alcohol, Ethyl (B) <10 <10 mg/dL    Comment:        LOWEST DETECTABLE LIMIT FOR SERUM ALCOHOL IS 10 mg/dL FOR MEDICAL PURPOSES ONLY Performed at [place] [address] [place], [place] [address]   Salicylate level     Status: None   Collection Time: 04/09/17  7:23 PM  Result Value Ref Range   Salicylate Lvl <0.9 2.8 - 30.0 mg/dL    Comment: Performed at [facility], [place] [address] [place], [place] [address]  Acetaminophen level     Status: Abnormal   Collection Time: 04/09/17  7:23 PM  Result Value Ref Range   Acetaminophen (Tylenol), Serum <10 (L) 10 - 30 ug/mL    Comment:        THERAPEUTIC CONCENTRATIONS VARY SIGNIFICANTLY. A RANGE OF 10-30 ug/mL MAY BE AN EFFECTIVE CONCENTRATION FOR MANY PATIENTS. HOWEVER, SOME ARE BEST TREATED AT CONCENTRATIONS OUTSIDE THIS RANGE. ACETAMINOPHEN CONCENTRATIONS >150 ug/mL AT 4 HOURS AFTER INGESTION AND >50 ug/mL AT 12 HOURS AFTER INGESTION ARE OFTEN ASSOCIATED WITH TOXIC REACTIONS. Performed at [facility], [place] [address] [place], [place] [address]   cbc     Status: None   Collection Time: 04/09/17  7:23 PM  Result Value Ref Range   WBC 9.1 4.0 - 10.5 K/uL   RBC 4.60 4.22 - 5.81 MIL/uL   Hemoglobin 15.1 13.0 - 17.0 g/dL   HCT 42.0 39.0 - 52.0 %   MCV 91.3 78.0 - 100.0 fL   MCH 32.8 26.0 - 34.0 pg   MCHC 36.0 30.0 - 36.0 g/dL   RDW 13.0 11.5 - 15.5 %   Platelets 261 150 - 400 K/uL    Comment: Performed at [facility], [place] [address], [place], [place] [address]    Current Facility-Administered Medications  Medication Dose Route Frequency Provider Last Rate Last Dose  . gabapentin (NEURONTIN) capsule 300 mg  300 mg Oral BID Hailei Besser, MD      . haloperidol (HALDOL) tablet 2 mg  2 mg Oral BID Corena Pilgrim, MD       No current outpatient medications on file.    Musculoskeletal: Strength & Muscle Tone: within normal limits Gait & Station: normal Patient leans: N/A  Psychiatric Specialty Exam: Physical Exam  Constitutional: He is oriented to person, place, and time. He appears well-developed and well-nourished.  HENT:  Head: Normocephalic.  Respiratory: Effort normal.  Musculoskeletal: Normal range of motion.  Neurological: He is alert and oriented to person, place, and time.  Psychiatric: His mood appears anxious. His  speech is rapid and/or pressured. He is agitated and aggressive. Thought content is paranoid. Cognition and memory are normal. He expresses impulsivity. He exhibits a depressed mood.    Review of Systems  Psychiatric/Behavioral: Positive for depression. Negative for hallucinations, memory loss, substance abuse and suicidal ideas. The patient is nervous/anxious and has insomnia.   All other systems reviewed and are negative.   Blood pressure (!) 97/55, pulse 83, temperature 98.4 F (36.9 C), temperature source Oral, resp. rate 18, height '5\' 9"'  (1.753 m), weight 72.6 kg (160 lb), SpO2 97 %.Body mass index is 23.63 kg/m.  General Appearance: Casual  Eye Contact:  Good  Speech:  Clear and Coherent  Volume:  Normal  Mood:  Angry, Anxious and Irritable  Affect:  Congruent  Thought Process:  Disorganized  Orientation:  Full (Time, Place, and Person)  Thought Content:  Illogical  Suicidal Thoughts:  No  Homicidal Thoughts:  No  Memory:  Immediate;   Good Recent;   Good Remote;   Fair  Judgement:  Poor  Insight:  Lacking  Psychomotor Activity:  Increased  Concentration:  Concentration: Fair and Attention Span: Fair  Recall:  [employer] of Knowledge:  Fair  Language:  Good  Akathisia:  No  Handed:  Right  AIMS (if indicated):     Assets:  Agricultural consultant Housing  ADL's:  Intact  Cognition:  WNL  Sleep:        Treatment Plan Summary: Daily contact with patient to assess and evaluate symptoms and progress in treatment and Medication management (see MAR)  Disposition: Recommend psychiatric Inpatient admission when medically cleared. TTS to seek placement  Ethelene Hal, NP 04/10/2017 12:49 PM  Patient seen face-to-face for psychiatric evaluation, chart reviewed and case discussed with the physician extender and developed treatment plan. Reviewed the information documented and agree with the treatment plan. Corena Pilgrim, MD

## 2017-04-10 NOTE — ED Notes (Signed)
Pt A&O x 3, irritable at present, calm & cooperative.  Watching TV at present.  Monitoring for safety, Q 15 min checks in effect, no distress noted.

## 2017-04-11 NOTE — BH Assessment (Signed)
Pt is cooperative and oriented x 4 during reassessment. He denies SI and HI. [facility] denies [facility] and no delusions noted. [facility] has been [facility].   Evette Cristalaroline Paige Saleah Rishel, [place] Therapeutic Triage Specialist

## 2017-04-11 NOTE — BHH Counselor (Signed)
Phone call from Belvedereenisha at [facility]. Dr Malvin JohnsBrian Farah has accepted pt. Please fax IVC to [phone]. She requests we fax face sheet with social security number. # for report [phone].   Evette Cristalaroline Paige Reve Crocket, [place] Therapeutic Triage Specialist

## 2017-04-11 NOTE — ED Notes (Signed)
[facility]  Pt was cooperative while placed in cuffs.  All belongings were sent with patient.

## 2017-06-19 ENCOUNTER — Emergency Department (HOSPITAL_COMMUNITY)
Admission: EM | Admit: 2017-06-19 | Discharge: 2017-06-19 | Disposition: A | Discharge status: Rehabilitation Facility | Payer: Medicaid Other | Source: Home / Self Care | Attending: Emergency Medicine | Admitting: Emergency Medicine

## 2017-06-19 ENCOUNTER — Encounter (HOSPITAL_COMMUNITY): Payer: Self-pay | Admitting: Emergency Medicine

## 2017-06-19 ENCOUNTER — Other Ambulatory Visit: Payer: Self-pay

## 2017-06-19 ENCOUNTER — Emergency Department (HOSPITAL_COMMUNITY)
Admission: EM | Admit: 2017-06-19 | Discharge: 2017-06-19 | Disposition: A | Discharge status: Home / Self Care | Payer: Medicaid Other | Attending: Emergency Medicine | Admitting: Emergency Medicine

## 2017-06-19 DIAGNOSIS — Z8673 Personal history of transient ischemic attack (TIA), and cerebral infarction without residual deficits: Secondary | ICD-10-CM

## 2017-06-19 DIAGNOSIS — F250 Schizoaffective disorder, bipolar type: Secondary | ICD-10-CM | POA: Diagnosis present

## 2017-06-19 DIAGNOSIS — R1031 Right lower quadrant pain: Secondary | ICD-10-CM | POA: Diagnosis not present

## 2017-06-19 DIAGNOSIS — R440 Auditory hallucinations: Secondary | ICD-10-CM | POA: Diagnosis present

## 2017-06-19 DIAGNOSIS — F3111 Bipolar disorder, current episode manic without psychotic features, mild: Secondary | ICD-10-CM | POA: Insufficient documentation

## 2017-06-19 DIAGNOSIS — R443 Hallucinations, unspecified: Secondary | ICD-10-CM | POA: Insufficient documentation

## 2017-06-19 DIAGNOSIS — F4322 Adjustment disorder with anxiety: Secondary | ICD-10-CM | POA: Diagnosis present

## 2017-06-19 DIAGNOSIS — Z79899 Other long term (current) drug therapy: Secondary | ICD-10-CM

## 2017-06-19 DIAGNOSIS — F319 Bipolar disorder, unspecified: Secondary | ICD-10-CM | POA: Diagnosis present

## 2017-06-19 DIAGNOSIS — F17210 Nicotine dependence, cigarettes, uncomplicated: Secondary | ICD-10-CM

## 2017-06-19 LAB — COMPREHENSIVE METABOLIC PANEL
ALT: 24 U/L (ref 17–63)
AST: 21 U/L (ref 15–41)
Albumin: 4.2 g/dL (ref 3.5–5.0)
Alkaline Phosphatase: 72 U/L (ref 38–126)
Anion gap: 13 (ref 5–15)
BUN: 10 mg/dL (ref 6–20)
CO2: 20 mmol/L — AB (ref 22–32)
CREATININE: 0.9 mg/dL (ref 0.61–1.24)
Calcium: 9 mg/dL (ref 8.9–10.3)
Chloride: 105 mmol/L (ref 101–111)
GFR calc non Af Amer: >60 mL/min (ref >60)
Glucose, Bld: 143 mg/dL — ABNORMAL HIGH (ref 65–99)
Potassium: 3.6 mmol/L (ref 3.5–5.1)
SODIUM: 138 mmol/L (ref 135–145)
Total Bilirubin: 1.4 mg/dL — ABNORMAL HIGH (ref 0.3–1.2)
Total Protein: 8 g/dL (ref 6.5–8.1)

## 2017-06-19 LAB — URINALYSIS, ROUTINE W REFLEX MICROSCOPIC
BILIRUBIN URINE: NEGATIVE
Glucose, UA: NEGATIVE mg/dL
HGB URINE DIPSTICK: NEGATIVE
Ketones, ur: NEGATIVE mg/dL
Leukocytes, UA: NEGATIVE
Nitrite: NEGATIVE
PH: 5 (ref 5.0–8.0)
Protein, ur: NEGATIVE mg/dL
SPECIFIC GRAVITY, URINE: 1.011 (ref 1.005–1.030)

## 2017-06-19 LAB — CBC
HCT: 47 % (ref 39.0–52.0)
HEMOGLOBIN: 16.7 g/dL (ref 13.0–17.0)
MCH: 32.9 pg (ref 26.0–34.0)
MCHC: 35.5 g/dL (ref 30.0–36.0)
MCV: 92.5 fL (ref 78.0–100.0)
PLATELETS: 271 10*3/uL (ref 150–400)
RBC: 5.08 MIL/uL (ref 4.22–5.81)
RDW: 12.8 % (ref 11.5–15.5)
WBC: 9.3 10*3/uL (ref 4.0–10.5)

## 2017-06-19 LAB — RAPID URINE DRUG SCREEN, HOSP PERFORMED
AMPHETAMINES: NOT DETECTED
Amphetamines: NOT DETECTED
BARBITURATES: NOT DETECTED
BENZODIAZEPINES: NOT DETECTED
Barbiturates: NOT DETECTED
Benzodiazepines: NOT DETECTED
COCAINE: NOT DETECTED
COCAINE: NOT DETECTED
OPIATES: NOT DETECTED
OPIATES: NOT DETECTED
TETRAHYDROCANNABINOL: NOT DETECTED
TETRAHYDROCANNABINOL: NOT DETECTED

## 2017-06-19 LAB — LIPASE, BLOOD: Lipase: 25 U/L (ref 11–51)

## 2017-06-19 LAB — ACETAMINOPHEN LEVEL: Acetaminophen (Tylenol), Serum: <10 ug/mL — ABNORMAL LOW (ref 10–30)

## 2017-06-19 LAB — SALICYLATE LEVEL

## 2017-06-19 LAB — ETHANOL: Alcohol, Ethyl (B): <10 mg/dL (ref <10)

## 2017-06-19 MED ORDER — RISPERIDONE 1 MG PO TABS: 1.0000 mg | ORAL_TABLET | Freq: Two times a day (BID) | ORAL | 0 refills | Status: DC

## 2017-06-19 MED ORDER — CARBAMAZEPINE ER 200 MG PO TB12
200.0000 mg | ORAL_TABLET | Freq: Two times a day (BID) | ORAL | Status: DC
  Filled 2017-06-19: qty 1

## 2017-06-19 MED ORDER — ONDANSETRON HCL 4 MG PO TABS: 4.0000 mg | ORAL_TABLET | Freq: Three times a day (TID) | ORAL | Status: DC | PRN

## 2017-06-19 MED ORDER — GABAPENTIN 300 MG PO CAPS
300.0000 mg | ORAL_CAPSULE | Freq: Two times a day (BID) | ORAL | Status: DC
  Administered 2017-06-19: 300 mg via ORAL
  Filled 2017-06-19: qty 1

## 2017-06-19 MED ORDER — CARBAMAZEPINE ER 200 MG PO TB12: 200.0000 mg | ORAL_TABLET | Freq: Two times a day (BID) | ORAL | 0 refills | Status: DC

## 2017-06-19 MED ORDER — RISPERIDONE 1 MG PO TABS
1.0000 mg | ORAL_TABLET | Freq: Two times a day (BID) | ORAL | Status: DC
  Administered 2017-06-19: 1 mg via ORAL
  Filled 2017-06-19: qty 1

## 2017-06-19 MED ORDER — RISPERIDONE 1 MG PO TBDP: 2.0000 mg | ORAL_TABLET | Freq: Three times a day (TID) | ORAL | Status: DC | PRN

## 2017-06-19 MED ORDER — LORAZEPAM 1 MG PO TABS: 1.0000 mg | ORAL_TABLET | ORAL | Status: DC | PRN

## 2017-06-19 MED ORDER — NICOTINE 21 MG/24HR TD PT24
21.0000 mg | MEDICATED_PATCH | Freq: Every day | TRANSDERMAL | Status: DC
  Administered 2017-06-19: 21 mg via TRANSDERMAL
  Filled 2017-06-19: qty 1

## 2017-06-19 MED ORDER — ALUM & MAG HYDROXIDE-SIMETH 200-200-20 MG/5ML PO SUSP: 30.0000 mL | Freq: Four times a day (QID) | ORAL | Status: DC | PRN

## 2017-06-19 MED ORDER — ZOLPIDEM TARTRATE 5 MG PO TABS: 5.0000 mg | ORAL_TABLET | Freq: Every evening | ORAL | Status: DC | PRN

## 2017-06-19 MED ORDER — IBUPROFEN 200 MG PO TABS: 600.0000 mg | ORAL_TABLET | Freq: Three times a day (TID) | ORAL | Status: DC | PRN

## 2017-06-19 MED ORDER — ZIPRASIDONE MESYLATE 20 MG IM SOLR: 20.0000 mg | INTRAMUSCULAR | Status: DC | PRN
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
## 2017-06-19 NOTE — ED Triage Notes (Signed)
Pt sent from [facility] for medical clearance. Pt was seen at this facility at 0400 and discharged.

## 2017-06-19 NOTE — ED Notes (Signed)
Patient in shower and talking to himself. Patient is being carefully monitored by tech.

## 2017-06-19 NOTE — ED Provider Notes (Signed)
[facility]-EMERGENCY DEPT Provider Note   CSN: [id number] Arrival date & time: 06/19/17  4098     History   Chief Complaint Chief Complaint  Patient presents with  . Suicidal  . Hallucinations  . Abdominal Pain    HPI [name] is a 47 y.o. male with history of tobacco abuse, bipolar 1 disorder, anxiety, stroke, alcohol abuse, and previous abdominal pain who presents to the  ED with request for medication restart, hallucinations, and anxiety. Patient states he has a hx of bipolar disorder- he has been off his medications for several years and feels he needs to restart meds. He states he is having "schizophrenic" symptoms- I asked further about this and he states he has heard voices but does not further elaborate on this. He has had what he is describing and two episodes of increased anxiety which escalates and results in tingling to his entire body with dyspnea and tearfulness. This is alleviated by nursing staff calming him. When asked if he is having chest pain during these events he states "no, maybe, not really"- no chest pain at present. He also mention some R sided mid abdominal pain that has been intermittent for a few days, had similar previously. Denies vomiting, diarrhea, fevers, or dysuria. RN notes mention SI- patient denies SI/HI to me. Nursing notes mention patient having conversation without anyone present in room.  HPI  Past Medical History:  Diagnosis Date  . Anxiety   . Bipolar 1 disorder (HCC)   . Stroke [facility])     Patient Active Problem List   Diagnosis Date Noted  . Cocaine abuse with cocaine-induced mood disorder (HCC) 04/08/2014  . Alcohol dependence with uncomplicated withdrawal (HCC) 04/07/2014  . Bipolar affective disorder, depressed, severe (HCC) 04/07/2014  . Suicidal ideations 04/07/2014  . Alcohol abuse, episodic 04/07/2014  . Alcohol dependence with alcohol-induced mood disorder (HCC)   . Bipolar I disorder, most recent  episode mixed (HCC) 08/31/2013  . Anxiety state, unspecified 08/31/2013  . Bipolar I disorder, current or most recent episode manic, with psychotic features (HCC) 08/30/2013  . Alcohol-induced mood disorder (HCC) 08/29/2013  . Diarrhea 01/30/2013  . Postop check 01/30/2013  . Abdominal pain 12/18/2012  . Family history of cardiac disorder 09/20/2012  . Excessive sweating, local 09/20/2012  . Compulsive tobacco user syndrome 09/20/2012    Past Surgical History:  Procedure Laterality Date  . CHOLECYSTECTOMY N/A 12/24/2012   Procedure: LAPAROSCOPIC CHOLECYSTECTOMY;  Surgeon: Cherylynn Ridges, MD;  Location: [facility] OR;  Service: General;  Laterality: N/A;  . FINGER SURGERY          Home Medications    Prior to Admission medications   Not on File    Family History Family History  Problem Relation Age of Onset  . Heart disease Mother     Social History Social History   Tobacco Use  . Smoking status: Current Every Day Smoker    Packs/day: 1.00    Years: 5.00    Pack years: 5.00    Types: Cigarettes  . Smokeless tobacco: Never Used  Substance Use Topics  . Alcohol use: Yes    Alcohol/week: 12.0 oz    Types: 20 Cans of beer per week    Comment: 3 24oz beers every other day  . Drug use: No    Types: Marijuana     Allergies   Patient has no known allergies.   Review of Systems Review of Systems  Constitutional: Negative for fever.  Eyes: Negative for visual disturbance.  Respiratory: Positive for shortness of breath (2 episodes, not at present. ). Negative for cough.   Gastrointestinal: Positive for abdominal pain. Negative for blood in stool, constipation, diarrhea, nausea and vomiting.  Genitourinary: Negative for dysuria.  Neurological: Negative for syncope, weakness, numbness and headaches.       Positive for generalized tingling- 2 episodes, not at present.   Psychiatric/Behavioral: Positive for hallucinations. Negative for suicidal ideas. The patient is  nervous/anxious.      Physical Exam Updated Vital Signs BP 109/78 (BP Location: Right Arm)   Pulse 84   Temp 98.1 F (36.7 C) (Oral)   Resp 16   Ht  (1.753 m)   Wt 81.6 kg (180 lb)   SpO2 97%   BMI 26.58 kg/m   Physical Exam  Constitutional: He appears well-developed and well-nourished.  Non-toxic appearance. No distress.  HENT:  Head: Normocephalic and atraumatic.  Eyes: Conjunctivae are normal. Right eye exhibits no discharge. Left eye exhibits no discharge.  Cardiovascular: Normal rate and regular rhythm.  No murmur heard. Pulses:      Radial pulses are 2+ on the right side, and 2+ on the left side.  Pulmonary/Chest: Effort normal and breath sounds normal. No respiratory distress. He has no wheezes. He has no rhonchi. He has no rales.  Abdominal: Soft. He exhibits no distension. There is no tenderness. There is no rigidity, no rebound, no guarding, no CVA tenderness, no tenderness at McBurney's point and negative Murphy's sign.  Neurological: He is alert.  Clear speech.   Skin: Skin is warm and dry. No rash noted.  Psychiatric: His mood appears anxious. He is agitated. He expresses no suicidal plans and no homicidal plans.  Nursing note and vitals reviewed.   ED Treatments / Results  Labs Results for orders placed or performed during the hospital encounter of 06/19/17  Comprehensive metabolic panel  Result Value Ref Range   Sodium 138 135 - 145 mmol/L   Potassium 3.6 3.5 - 5.1 mmol/L   Chloride 105 101 - 111 mmol/L   CO2 20 (L) 22 - 32 mmol/L   Glucose, Bld 143 (H) 65 - 99 mg/dL   BUN 10 6 - 20 mg/dL   Creatinine, Ser 4.09 0.61 - 1.24 mg/dL   Calcium 9.0 8.9 - 81.1 mg/dL   Total Protein 8.0 6.5 - 8.1 g/dL   Albumin 4.2 3.5 - 5.0 g/dL   AST 21 15 - 41 U/L   ALT 24 17 - 63 U/L   Alkaline Phosphatase 72 38 - 126 U/L   Total Bilirubin 1.4 (H) 0.3 - 1.2 mg/dL   GFR calc non Af Amer >60 >60 mL/min   GFR calc Af Amer >60 >60 mL/min   Anion gap 13 5 - 15    Ethanol  Result Value Ref Range   Alcohol, Ethyl (B) <10 <10 mg/dL  Salicylate level  Result Value Ref Range   Salicylate Lvl <7.0 2.8 - 30.0 mg/dL  Acetaminophen level  Result Value Ref Range   Acetaminophen (Tylenol), Serum <10 (L) 10 - 30 ug/mL  cbc  Result Value Ref Range   WBC 9.3 4.0 - 10.5 K/uL   RBC 5.08 4.22 - 5.81 MIL/uL   Hemoglobin 16.7 13.0 - 17.0 g/dL   HCT 91.4 78.2 - 95.6 %   MCV 92.5 78.0 - 100.0 fL   MCH 32.9 26.0 - 34.0 pg   MCHC 35.5 30.0 - 36.0 g/dL   RDW 21.3 08.6 -  15.5 %   Platelets 271 150 - 400 K/uL  Rapid urine drug screen (hospital performed)  Result Value Ref Range   Opiates NONE DETECTED NONE DETECTED   Cocaine NONE DETECTED NONE DETECTED   Benzodiazepines NONE DETECTED NONE DETECTED   Amphetamines NONE DETECTED NONE DETECTED   Tetrahydrocannabinol NONE DETECTED NONE DETECTED   Barbiturates NONE DETECTED NONE DETECTED  Lipase, blood  Result Value Ref Range   Lipase 25 11 - 51 U/L  Urinalysis, Routine w reflex microscopic  Result Value Ref Range   Color, Urine YELLOW YELLOW   APPearance CLEAR CLEAR   Specific Gravity, Urine 1.011 1.005 - 1.030   pH 5.0 5.0 - 8.0   Glucose, UA NEGATIVE NEGATIVE mg/dL   Hgb urine dipstick NEGATIVE NEGATIVE   Bilirubin Urine NEGATIVE NEGATIVE   Ketones, ur NEGATIVE NEGATIVE mg/dL   Protein, ur NEGATIVE NEGATIVE mg/dL   Nitrite NEGATIVE NEGATIVE   Leukocytes, UA NEGATIVE NEGATIVE   EKG EKG Interpretation  Date/Time:  Sunday June 19 2017 08:52:26 EDT Ventricular Rate:  85 PR Interval:    QRS Duration: 95 QT Interval:  358 QTC Calculation: 426 R Axis:   -45 Text Interpretation:  Sinus rhythm LAD, consider left anterior fascicular block Borderline low voltage, extremity leads No previous ECGs available Confirmed by Alvira Monday ([id number]) on 06/19/2017 9:09:53 AM  Radiology No results found.  Procedures Procedures (including critical care time)  Medications Ordered in ED Medications - No data to  display   Initial Impression / Assessment and Plan / ED Course  I have reviewed the triage vital signs and the nursing notes.  Pertinent labs & imaging results that were available during my care of the patient were reviewed by me and considered in my medical decision making (see chart for details).   Patient presents with multiple psych related complaints. He is nontoxic appearing, in no apparent distress, initial vitals notable for mild tachypnea which has resolved on my exam and with repeat vitals.   Screening labs reviewed and fairly unremarkable. Hyperglycemia at 143. Mild elevation in total bilirubin at 1.4. No leukocytosis. No significant abnormalities in renal fxn, LFTs, or electrolytes. UA negative- no obvious infection. Lipase WNL.   Patient describes what appears to be two panic attacks- given sxs will obtain EKG- EKG without obvious signs of ischemia, appears fairly unchanged from previous on chart review, do not suspect ACS or underlying cardiopulmonary etiology, suspect related to anxiety.  He also mentions intermittent R mid to upper abdominal pain. Abdomen is completely non tender, there are no peritoneal signs. Patient is s/p cholecystectomy. Do not suspect - appendicitis, bowel obstruction, bowel perforation, pancreatitis or diverticulitis.   Patient has been medically cleared for TTS evaluation. Disposition per behavioral health.    Final Clinical Impressions(s) / ED Diagnoses   Final diagnoses:  Adjustment disorder with anxious mood    ED Discharge Orders    None       Cherly Anderson, PA-C 06/19/17 1322    Alvira Monday, MD 06/20/17 2308

## 2017-06-19 NOTE — ED Notes (Signed)
Pt continues to talk  And see things that are not present.

## 2017-06-19 NOTE — ED Notes (Signed)
Pt has discharge instructions, prescriptions, list of homeless shelters and bus pass. Pt not wanting to leave, states "I brought myself here involuntary to get help." " I want to go across to behavior health. Explained he has been evaluated and the doctor feels he is appropriate for discharge. Pt given clothes and allowed to dress.

## 2017-06-19 NOTE — ED Notes (Signed)
Pt upset with Clinical research associate, he wants his "blood number" tried to explain we can not obtain lab for request without doctors order, he states "I have $6 in my shorts, will that pay for it" then proceeds to state "I get paid this week, if they draw it I can come back and pay." Pt constantly asking for "blood number" as well as talking to people who are not present. Dr Jannifer Franklin and Molli Knock NP evaluated pt.

## 2017-06-19 NOTE — ED Notes (Signed)
Pt becoming loud and talking to people who are not present. Pt agitated, pt did take meds as ordered

## 2017-06-19 NOTE — [facility] Suicide Risk Assessment (Signed)
Suicide Risk Assessment  Discharge Assessment   [facility] Discharge Suicide Risk Assessment   Principal Problem: Adjustment disorder with anxious mood Discharge Diagnoses:  Patient Active Problem List   Diagnosis Date Noted  . Adjustment disorder with anxious mood [F43.22] 06/19/2017    Priority: High  . Cocaine abuse with cocaine-induced mood disorder (HCC) [F14.14] 04/08/2014  . Alcohol abuse, episodic [F10.10] 04/07/2014  . Alcohol dependence with alcohol-induced mood disorder (HCC) [F10.24]   . Bipolar I disorder, most recent episode mixed (HCC) [F31.60] 08/31/2013  . Anxiety state, unspecified [F41.1] 08/31/2013  . Bipolar disorder (HCC) [F31.9] 08/30/2013  . Alcohol-induced mood disorder (HCC) [F10.94] 08/29/2013  . Diarrhea [R19.7] 01/30/2013  . Postop check [Z09] 01/30/2013  . Abdominal pain [R10.9] 12/18/2012  . Family history of cardiac disorder [Z82.49] 09/20/2012  . Excessive sweating, local [L74.519] 09/20/2012  . Compulsive tobacco user syndrome [F17.200] 09/20/2012    Total Time spent with patient: 45 minutes  Musculoskeletal: Strength & Muscle Tone: within normal limits Gait & Station: normal Patient leans: N/A  Psychiatric Specialty Exam:   Blood pressure 114/74, pulse 86, temperature 98.1 F (36.7 C), temperature source Oral, resp. rate 20, height  (1.753 m), weight 81.6 kg (180 lb), SpO2 95 %.Body mass index is 26.58 kg/m.  General Appearance: Casual  Eye Contact::  Good  Speech:  Normal Rate409  Volume:  Normal  Mood:  Euthymic  Affect:  Congruent  Thought Process:  Coherent and Descriptions of Associations: Intact  Orientation:  Full (Time, Place, and Person)  Thought Content:  WDL and Logical  Suicidal Thoughts:  No  Homicidal Thoughts:  No  Memory:  Immediate;   Good Recent;   Good Remote;   Good  Judgement:  Fair  Insight:  Fair  Psychomotor Activity:  Normal  Concentration:  Good  Recall:  Good  Fund of Knowledge:Fair  Language: Good   Akathisia:  No  Handed:  Right  AIMS (if indicated):     Assets:  Leisure Time Physical Health Resilience  Sleep:     Cognition: WNL  ADL's:  Intact   Mental Status Per Nursing Assessment::   On Admission:   47 yo male who presented to the ED requesting medications.  No suicidal/homicidal ideations, hallucinations, or substance abuse.  He was recently released from jail and on probation here in [place].  Rx provided along with [facility] information for follow-up, stable for discharge.  Demographic Factors:  Male and Caucasian  Loss Factors: NA  Historical Factors: NA  Risk Reduction Factors:   Sense of responsibility to family  Continued Clinical Symptoms:  None  Cognitive Features That Contribute To Risk:  None    Suicide Risk:  Minimal: No identifiable suicidal ideation.  Patients presenting with no risk factors but with morbid ruminations; may be classified as minimal risk based on the severity of the depressive symptoms    Plan Of Care/Follow-up recommendations:  Activity:  as tolerated Diet:  heart healthy diet  LORD, JAMISON, NP 06/19/2017, 1:25 PM

## 2017-06-19 NOTE — ED Triage Notes (Addendum)
Patient states he wants to hurt himself and be IVC'd. Patient states he has been off his medication for a while. Patient while in room changing, Patient was having a full blown conversation with someone in the room. When nurse was talking to patient, he told someone to get out of the room. When nurse asked who he said them. Patient is complaining of right lower abdominal pain. Patient states it has been hurting for a few days. Abdomen is not distended.

## 2017-06-19 NOTE — ED Notes (Signed)
Continuing to wait for EDP to assess pt. Notified CN asking her to request EDP to come screen.

## 2017-06-19 NOTE — ED Provider Notes (Signed)
[facility]-EMERGENCY DEPT Provider Note   CSN: [id number] Arrival date & time: 06/19/17  1945     History   Chief Complaint Chief Complaint  Patient presents with  . Medical Clearance    HPI [name] is a 47 y.o. male.  The history is provided by the patient and medical records. No language interpreter was used.   [name] is a 47 y.o. male  with a PMH of bipolar disorder, adjustment disorder who presents to the Emergency Department from [facility] for medical clearance.  Patient states that he has been up for 24 hours straight and is starting to hallucinate.  He also states that he has been on his feet all day and that his feet hurt.  He reportedly was requesting discharge at [facility], however states to me that he has not had any substances.  He states that he has been sober for the last 2 years and that he is not an alcoholic.  He denies any suicidal or homicidal thoughts.  Past Medical History:  Diagnosis Date  . Anxiety   . Bipolar 1 disorder (HCC)   . Stroke [facility])     Patient Active Problem List   Diagnosis Date Noted  . Adjustment disorder with anxious mood 06/19/2017  . Cocaine abuse with cocaine-induced mood disorder (HCC) 04/08/2014  . Alcohol abuse, episodic 04/07/2014  . Alcohol dependence with alcohol-induced mood disorder (HCC)   . Bipolar I disorder, most recent episode mixed (HCC) 08/31/2013  . Anxiety state, unspecified 08/31/2013  . Bipolar disorder (HCC) 08/30/2013  . Alcohol-induced mood disorder (HCC) 08/29/2013  . Diarrhea 01/30/2013  . Postop check 01/30/2013  . Abdominal pain 12/18/2012  . Family history of cardiac disorder 09/20/2012  . Excessive sweating, local 09/20/2012  . Compulsive tobacco user syndrome 09/20/2012    Past Surgical History:  Procedure Laterality Date  . CHOLECYSTECTOMY N/A 12/24/2012   Procedure: LAPAROSCOPIC CHOLECYSTECTOMY;  Surgeon: Cherylynn Ridges, MD;  Location: [facility] OR;  Service: General;   Laterality: N/A;  . FINGER SURGERY          Home Medications    Prior to Admission medications   Medication Sig Start Date End Date Taking? Authorizing Provider  carbamazepine (TEGRETOL XR) 200 MG 12 hr tablet Take 1 tablet (200 mg total) by mouth 2 (two) times daily. 06/19/17   Charm Rings, NP  risperiDONE (RISPERDAL) 1 MG tablet Take 1 tablet (1 mg total) by mouth 2 (two) times daily. 06/19/17   Charm Rings, NP    Family History Family History  Problem Relation Age of Onset  . Heart disease Mother     Social History Social History   Tobacco Use  . Smoking status: Current Every Day Smoker    Packs/day: 1.00    Years: 5.00    Pack years: 5.00    Types: Cigarettes  . Smokeless tobacco: Never Used  Substance Use Topics  . Alcohol use: Yes    Alcohol/week: 12.0 oz    Types: 20 Cans of beer per week    Comment: BAC was clear  . Drug use: No    Types: Marijuana    Comment: UDS was clear     Allergies   Patient has no known allergies.   Review of Systems Review of Systems  Psychiatric/Behavioral: Positive for hallucinations.  All other systems reviewed and are negative.    Physical Exam Updated Vital Signs There were no vitals taken for this visit.  Physical Exam  Constitutional: He is oriented to person, place, and time. He appears well-developed and well-nourished. No distress.  HENT:  Head: Normocephalic and atraumatic.  Cardiovascular: Normal rate, regular rhythm and normal heart sounds.  No murmur heard. Pulmonary/Chest: Effort normal and breath sounds normal. No respiratory distress.  Abdominal: Soft. He exhibits no distension. There is no tenderness.  Musculoskeletal: Normal range of motion.  Neurological: He is alert and oriented to person, place, and time.  Skin: Skin is warm and dry.  Nursing note and vitals reviewed.    ED Treatments / Results  Labs (all labs ordered are listed, but only abnormal results are displayed) Labs  Reviewed  RAPID URINE DRUG SCREEN, HOSP PERFORMED  ETHANOL    EKG None  Radiology No results found.  Procedures Procedures (including critical care time)  Medications Ordered in ED Medications - No data to display   Initial Impression / Assessment and Plan / ED Course  I have reviewed the triage vital signs and the nursing notes.  Pertinent labs & imaging results that were available during my care of the patient were reviewed by me and considered in my medical decision making (see chart for details).    [name] is a 47 y.o. male who presents to ED from [facility] for medical clearance.  He was seen in the emergency department early this morning where full labs were done.  I have reviewed lab studies and do not feel any need to repeat these.  He did have hyperglycemia at 143, but no signs of DKA.  Exam with no concerning findings.  Called [facility] to ask specifically what they would need for medical clearance as he had work-up at 445 this morning that was reassuring.  They are requesting UDS and alcohol level which was drawn and negative.  Will discharge back to [facility] in stable condition.   Final Clinical Impressions(s) / ED Diagnoses   Final diagnoses:  Hallucinations    ED Discharge Orders    None       Ward, Chase Picket, PA-C 06/19/17 2112    Raeford Razor, MD 06/20/17 [phone]

## 2017-06-19 NOTE — Discharge Instructions (Signed)
Return to ER for new or worsening symptoms, any additional concerns.  °

## 2018-06-12 ENCOUNTER — Ambulatory Visit: Payer: Self-pay | Admitting: Family Medicine

## 2019-03-30 ENCOUNTER — Emergency Department (HOSPITAL_COMMUNITY)
Admission: EM | Admit: 2019-03-30 | Discharge: 2019-03-31 | Disposition: A | Discharge status: Other Acute Inpatient Hospital | Payer: Medicaid Other | Attending: Emergency Medicine | Admitting: Emergency Medicine

## 2019-03-30 ENCOUNTER — Encounter (HOSPITAL_COMMUNITY): Payer: Self-pay | Admitting: Emergency Medicine

## 2019-03-30 ENCOUNTER — Other Ambulatory Visit: Payer: Self-pay

## 2019-03-30 DIAGNOSIS — F17210 Nicotine dependence, cigarettes, uncomplicated: Secondary | ICD-10-CM | POA: Insufficient documentation

## 2019-03-30 DIAGNOSIS — R45851 Suicidal ideations: Secondary | ICD-10-CM | POA: Insufficient documentation

## 2019-03-30 DIAGNOSIS — Z20822 Contact with and (suspected) exposure to covid-19: Secondary | ICD-10-CM | POA: Insufficient documentation

## 2019-03-30 DIAGNOSIS — F32A Depression, unspecified: Secondary | ICD-10-CM

## 2019-03-30 DIAGNOSIS — Z046 Encounter for general psychiatric examination, requested by authority: Secondary | ICD-10-CM | POA: Insufficient documentation

## 2019-03-30 DIAGNOSIS — F329 Major depressive disorder, single episode, unspecified: Secondary | ICD-10-CM

## 2019-03-30 DIAGNOSIS — IMO0002 Reserved for concepts with insufficient information to code with codable children: Secondary | ICD-10-CM

## 2019-03-30 DIAGNOSIS — Z7289 Other problems related to lifestyle: Secondary | ICD-10-CM | POA: Insufficient documentation

## 2019-03-30 DIAGNOSIS — F312 Bipolar disorder, current episode manic severe with psychotic features: Secondary | ICD-10-CM | POA: Insufficient documentation

## 2019-03-30 LAB — CBC
HCT: 42.8 % (ref 39.0–52.0)
Hemoglobin: 14.7 g/dL (ref 13.0–17.0)
MCH: 33 pg (ref 26.0–34.0)
MCHC: 34.3 g/dL (ref 30.0–36.0)
MCV: 96 fL (ref 80.0–100.0)
Platelets: 234 10*3/uL (ref 150–400)
RBC: 4.46 MIL/uL (ref 4.22–5.81)
RDW: 11.9 % (ref 11.5–15.5)
WBC: 8.2 10*3/uL (ref 4.0–10.5)
nRBC: 0 % (ref 0.0–0.2)

## 2019-03-30 LAB — COMPREHENSIVE METABOLIC PANEL
ALT: 20 U/L (ref 0–44)
AST: 19 U/L (ref 15–41)
Albumin: 4.3 g/dL (ref 3.5–5.0)
Alkaline Phosphatase: 75 U/L (ref 38–126)
Anion gap: 11 (ref 5–15)
BUN: 6 mg/dL (ref 6–20)
CO2: 26 mmol/L (ref 22–32)
Calcium: 9.3 mg/dL (ref 8.9–10.3)
Chloride: 104 mmol/L (ref 98–111)
Creatinine, Ser: 0.92 mg/dL (ref 0.61–1.24)
GFR calc Af Amer: >60 mL/min (ref >60)
GFR calc non Af Amer: >60 mL/min (ref >60)
Glucose, Bld: 114 mg/dL — ABNORMAL HIGH (ref 70–99)
Potassium: 3.7 mmol/L (ref 3.5–5.1)
Sodium: 141 mmol/L (ref 135–145)
Total Bilirubin: 0.9 mg/dL (ref 0.3–1.2)
Total Protein: 7.8 g/dL (ref 6.5–8.1)

## 2019-03-30 LAB — ACETAMINOPHEN LEVEL: Acetaminophen (Tylenol), Serum: <10 ug/mL — ABNORMAL LOW (ref 10–30)

## 2019-03-30 LAB — RAPID URINE DRUG SCREEN, HOSP PERFORMED
Amphetamines: POSITIVE — AB
Barbiturates: NOT DETECTED
Benzodiazepines: NOT DETECTED
Cocaine: NOT DETECTED
Opiates: NOT DETECTED
Tetrahydrocannabinol: NOT DETECTED

## 2019-03-30 LAB — ETHANOL: Alcohol, Ethyl (B): <10 mg/dL (ref <10)

## 2019-03-30 LAB — SALICYLATE LEVEL: Salicylate Lvl: <7 mg/dL — ABNORMAL LOW (ref 7.0–30.0)

## 2019-03-30 MED ORDER — ONDANSETRON HCL 4 MG PO TABS: 4.0000 mg | ORAL_TABLET | Freq: Three times a day (TID) | ORAL | Status: DC | PRN

## 2019-03-30 NOTE — ED Provider Notes (Signed)
[facility] DEPT Provider Note   CSN: [id number] Arrival date & time: 03/30/19  1925     History Chief Complaint  Patient presents with  . Homicidal  . Suicidal    [name] is a 49 y.o. male.  HPI   49 year old male with intentional self-harm.  He tells me that he has been "dealing with a lot of things."  He does not provide any specifics though.  Today he cut his hand "to see what color I would bleed."  His mother found him with a knife which is why he ended up here in the emergency room.  Longstanding history of "drinking and drugging."  The only time he is able to quit is when he runs out of money.  Last had a beer yesterday and Adderall that he bought from a neighbor.  Denies thoughts of wanting to harm anybody else.  Denies hallucinations.  Past Medical History:  Diagnosis Date  . Anxiety   . Bipolar 1 disorder ([place])   . Stroke [facility])    Patient Active Problem List   Diagnosis Date Noted  . Adjustment disorder with anxious mood 06/19/2017  . Cocaine abuse with cocaine-induced mood disorder ([place]) 04/08/2014  . Alcohol abuse, episodic 04/07/2014  . Alcohol dependence with alcohol-induced mood disorder ([place])   . Bipolar I disorder, most recent episode mixed ([place]) 08/31/2013  . Anxiety state, unspecified 08/31/2013  . Bipolar disorder ([place]) 08/30/2013  . Alcohol-induced mood disorder ([place]) 08/29/2013  . Diarrhea 01/30/2013  . Postop check 01/30/2013  . Abdominal pain 12/18/2012  . Family history of cardiac disorder 09/20/2012  . Excessive sweating, local 09/20/2012  . Compulsive tobacco user syndrome 09/20/2012   Past Surgical History:  Procedure Laterality Date  . CHOLECYSTECTOMY N/A 12/24/2012   Procedure: LAPAROSCOPIC CHOLECYSTECTOMY;  Surgeon: Gwenyth Ober, MD;  Location: [facility] OR;  Service: General;  Laterality: N/A;  . FINGER SURGERY       Family History  Problem Relation Age of Onset  . Heart disease Mother    Social  History   Tobacco Use  . Smoking status: Current Every Day Smoker    Packs/day: 1.00    Years: 5.00    Pack years: 5.00    Types: Cigarettes  . Smokeless tobacco: Never Used  Substance Use Topics  . Alcohol use: Yes    Alcohol/week: 20.0 standard drinks    Types: 20 Cans of beer per week    Comment: BAC was clear  . Drug use: No    Types: Marijuana    Comment: UDS was clear    Home Medications Prior to Admission medications   Medication Sig Start Date End Date Taking? Authorizing Provider  carbamazepine (TEGRETOL XR) 200 MG 12 hr tablet Take 1 tablet (200 mg total) by mouth 2 (two) times daily. Patient not taking: Reported on 03/30/2019 06/19/17   Patrecia Pour, NP  risperiDONE (RISPERDAL) 1 MG tablet Take 1 tablet (1 mg total) by mouth 2 (two) times daily. Patient not taking: Reported on 03/30/2019 06/19/17   Patrecia Pour, NP    Allergies    Patient has no known allergies.  Review of Systems   Review of Systems All systems reviewed and negative, other than as noted in HPI.  Physical Exam Updated Vital Signs BP (!) 130/99 (BP Location: Left Arm)   Pulse 76   Temp 98.3 F (36.8 C) (Oral)   Resp 17   SpO2 97%   Physical Exam Vitals and  nursing note reviewed.  Constitutional:      General: He is not in acute distress.    Appearance: He is well-developed.  HENT:     Head: Normocephalic and atraumatic.  Eyes:     General:        Right eye: No discharge.        Left eye: No discharge.     Conjunctiva/sclera: Conjunctivae normal.  Cardiovascular:     Rate and Rhythm: Normal rate and regular rhythm.     Heart sounds: Normal heart sounds. No murmur. No friction rub. No gallop.   Pulmonary:     Effort: Pulmonary effort is normal. No respiratory distress.     Breath sounds: Normal breath sounds.  Abdominal:     General: There is no distension.     Palpations: Abdomen is soft.     Tenderness: There is no abdominal tenderness.  Musculoskeletal:        General: No  tenderness.     Cervical back: Neck supple.     Comments: Deep abrasion/superficial laceration to the palm of the left hand.  Neurovascularly intact.  No active bleeding.  Skin:    General: Skin is warm and dry.  Neurological:     Mental Status: He is alert.  Psychiatric:     Comments: Calm and cooperative. Affect is flat though and has poor eye contact.    ED Results / Procedures / Treatments   Labs (all labs ordered are listed, but only abnormal results are displayed) Labs Reviewed  COMPREHENSIVE METABOLIC PANEL - Abnormal; Notable for the following components:      Result Value   Glucose, Bld 114 (*)    All other components within normal limits  SALICYLATE LEVEL - Abnormal; Notable for the following components:   Salicylate Lvl <7.0 (*)    All other components within normal limits  ACETAMINOPHEN LEVEL - Abnormal; Notable for the following components:   Acetaminophen (Tylenol), Serum <10 (*)    All other components within normal limits  RAPID URINE DRUG SCREEN, HOSP PERFORMED - Abnormal; Notable for the following components:   Amphetamines POSITIVE (*)    All other components within normal limits  RESPIRATORY PANEL BY PCR  RESPIRATORY PANEL BY RT PCR (FLU A&B, COVID)  ETHANOL  CBC   EKG None  Radiology No results found.  Procedures Procedures (including critical care time)  Medications Ordered in ED Medications  ondansetron (ZOFRAN) tablet 4 mg (has no administration in time range)   ED Course  I have reviewed the triage vital signs and the nursing notes.  Pertinent labs & imaging results that were available during my care of the patient were reviewed by me and considered in my medical decision making (see chart for details).    MDM Rules/Calculators/A&P                      49 year old male with depression and self-injurious behavior.  Medically cleared.  TTS evaluation.  Final Clinical Impression(s) / ED Diagnoses Final diagnoses:  Depression,  unspecified depression type  Self-inflicted injury    Rx / DC Orders ED Discharge Orders    None       Raeford Razor, MD 03/31/19 0006

## 2019-03-30 NOTE — ED Triage Notes (Signed)
Patient states he is homicidal and suicidal. Patient cut his right wrist. Cut is superficial. Patient state he doesn't want to live anymore.

## 2019-03-30 NOTE — ED Notes (Signed)
Pt alert x4 , flat affect states that he lives at home with his mother. Had thoughts of harming himself today. Sustained a self inflected superficial laceration to the lf palm. States that he wanted to see what color his blood was.  Personal items placed in locker 28, has a large tan coat, with blue jeans, brown shoes sweat and tee shirt, cigarette  lighter and cell phone. I will continue to monitor.

## 2019-03-31 ENCOUNTER — Inpatient Hospital Stay (HOSPITAL_COMMUNITY)
Admission: AD | Admit: 2019-03-31 | Discharge: 2019-04-03 | DRG: 897 | Disposition: A | Discharge status: Home / Self Care | Payer: Medicaid Other | Source: Intra-hospital | Attending: Psychiatry | Admitting: Psychiatry

## 2019-03-31 ENCOUNTER — Encounter (HOSPITAL_COMMUNITY): Payer: Self-pay | Admitting: Nurse Practitioner

## 2019-03-31 DIAGNOSIS — Z8249 Family history of ischemic heart disease and other diseases of the circulatory system: Secondary | ICD-10-CM

## 2019-03-31 DIAGNOSIS — Z915 Personal history of self-harm: Secondary | ICD-10-CM

## 2019-03-31 DIAGNOSIS — F1994 Other psychoactive substance use, unspecified with psychoactive substance-induced mood disorder: Secondary | ICD-10-CM | POA: Diagnosis not present

## 2019-03-31 DIAGNOSIS — F250 Schizoaffective disorder, bipolar type: Secondary | ICD-10-CM | POA: Diagnosis not present

## 2019-03-31 DIAGNOSIS — F17210 Nicotine dependence, cigarettes, uncomplicated: Secondary | ICD-10-CM | POA: Diagnosis present

## 2019-03-31 DIAGNOSIS — Z23 Encounter for immunization: Secondary | ICD-10-CM | POA: Diagnosis not present

## 2019-03-31 DIAGNOSIS — F1010 Alcohol abuse, uncomplicated: Secondary | ICD-10-CM | POA: Diagnosis present

## 2019-03-31 DIAGNOSIS — F1594 Other stimulant use, unspecified with stimulant-induced mood disorder: Secondary | ICD-10-CM | POA: Diagnosis present

## 2019-03-31 DIAGNOSIS — F259 Schizoaffective disorder, unspecified: Secondary | ICD-10-CM | POA: Diagnosis present

## 2019-03-31 DIAGNOSIS — F1210 Cannabis abuse, uncomplicated: Secondary | ICD-10-CM | POA: Diagnosis present

## 2019-03-31 LAB — RESPIRATORY PANEL BY PCR

## 2019-03-31 LAB — RESPIRATORY PANEL BY RT PCR (FLU A&B, COVID)
Influenza A by PCR: NEGATIVE
Influenza B by PCR: NEGATIVE
SARS Coronavirus 2 by RT PCR: NEGATIVE

## 2019-03-31 MED ORDER — LOPERAMIDE HCL 2 MG PO CAPS: 2.0000 mg | ORAL_CAPSULE | ORAL | Status: DC | PRN

## 2019-03-31 MED ORDER — MAGNESIUM HYDROXIDE 400 MG/5ML PO SUSP: 30.0000 mL | Freq: Every day | ORAL | Status: DC | PRN

## 2019-03-31 MED ORDER — LORAZEPAM 1 MG PO TABS
1.0000 mg | ORAL_TABLET | Freq: Four times a day (QID) | ORAL | Status: DC | PRN
  Filled 2019-03-31: qty 1

## 2019-03-31 MED ORDER — RISPERIDONE 1 MG PO TABS
1.0000 mg | ORAL_TABLET | Freq: Two times a day (BID) | ORAL | Status: DC
  Administered 2019-03-31 – 2019-04-01 (×2): 1 mg via ORAL
  Filled 2019-03-31 (×8): qty 1

## 2019-03-31 MED ORDER — ADULT MULTIVITAMIN W/MINERALS CH
1.0000 | ORAL_TABLET | Freq: Every day | ORAL | Status: DC
  Administered 2019-03-31 – 2019-04-03 (×4): 1 via ORAL
  Filled 2019-03-31 (×8): qty 1

## 2019-03-31 MED ORDER — TRAZODONE HCL 50 MG PO TABS
50.0000 mg | ORAL_TABLET | Freq: Every evening | ORAL | Status: DC | PRN
  Filled 2019-03-31 (×3): qty 1

## 2019-03-31 MED ORDER — ENSURE ENLIVE PO LIQD
237.0000 mL | Freq: Two times a day (BID) | ORAL | Status: DC
  Administered 2019-03-31 – 2019-04-03 (×3): 237 mL via ORAL

## 2019-03-31 MED ORDER — HYDROXYZINE HCL 25 MG PO TABS: 25.0000 mg | ORAL_TABLET | Freq: Four times a day (QID) | ORAL | Status: DC | PRN

## 2019-03-31 MED ORDER — ALUM & MAG HYDROXIDE-SIMETH 200-200-20 MG/5ML PO SUSP: 30.0000 mL | ORAL | Status: DC | PRN

## 2019-03-31 MED ORDER — HYDROXYZINE HCL 25 MG PO TABS: 25.0000 mg | ORAL_TABLET | Freq: Three times a day (TID) | ORAL | Status: DC | PRN

## 2019-03-31 MED ORDER — INFLUENZA VAC SPLIT QUAD 0.5 ML IM SUSY
0.5000 mL | PREFILLED_SYRINGE | INTRAMUSCULAR | Status: AC
  Administered 2019-04-01: 0.5 mL via INTRAMUSCULAR
  Filled 2019-03-31: qty 0.5

## 2019-03-31 MED ORDER — ONDANSETRON 4 MG PO TBDP: 4.0000 mg | ORAL_TABLET | Freq: Four times a day (QID) | ORAL | Status: DC | PRN

## 2019-03-31 MED ORDER — THIAMINE HCL 100 MG/ML IJ SOLN
100.0000 mg | Freq: Once | INTRAMUSCULAR | Status: AC
  Administered 2019-03-31: 100 mg via INTRAMUSCULAR
  Filled 2019-03-31: qty 2

## 2019-03-31 MED ORDER — THIAMINE HCL 100 MG PO TABS
100.0000 mg | ORAL_TABLET | Freq: Every day | ORAL | Status: DC
  Administered 2019-04-01 – 2019-04-03 (×3): 100 mg via ORAL
  Filled 2019-03-31 (×6): qty 1

## 2019-03-31 MED ORDER — ACETAMINOPHEN 325 MG PO TABS: 650.0000 mg | ORAL_TABLET | Freq: Four times a day (QID) | ORAL | Status: DC | PRN

## 2019-03-31 NOTE — ED Provider Notes (Signed)
Patient seen on morning rounds.  Patient was comfortably sleeping at time of my initial evaluation this morning.  No new overnight events to report per staff.  Patient has placement pending at [facility].  Staff reports that he should probably leave sometime around 9 or 10 AM this morning.     Wynetta Fines, MD 03/31/19 [phone]

## 2019-03-31 NOTE — ED Notes (Signed)
Pt off unit to [facility].Pt voluntary. Pt alert , cooperative, calm, no s/s of distress. DC information given to [employer] for [facility]. Report given to [facility]. Belongings given to [employer] for [facility]. Pt ambulatory off unit, escorted be NT.  Pt transported by [employer].

## 2019-03-31 NOTE — BH Assessment (Signed)
Tele Assessment Note   Patient Name: [name] MRN: [id number] Referring Physician: Virgel Manifold, MD Location of Patient: [name] Location of Provider: [place] is an 49 y.o. male. Pt presents to [facility] via EMS after he intentionally cut himself with a knife.  Pt states, " I was trying to see what kind of blood I bleed". Pt states that he was suicidal earlier when he cut himself but does not endorse current SI. Pt reports one past SI attempt of overdose several years ago. Pt denies HI but endorses audio hallucinations, states he does hear voices/people talking but not sure what they are saying. Pt states he hallucinates daily and this is a stressor for him as well as drinking and smoking marijuana daily. Pt reports smoking several grams a day as well as  Drinking several beers. Pt reports no history of abuse/neglect or family history of mental illness/substance use or suicide. Pt reports getting 8 hours of sleep at night but a poor appetite. Pt reports he has panic attacks once a month and had one the other day due to stress. Pt denies any major symptoms of depression but does endorse anxiety and tearfulness. Pt reports he is currently on [facility] and received his monthly injection 2 weeks a go. Pt's current psychtriatrist is also through [facility] who he reports he has been with for several years.  Pt does have past psychtric inpatient treatment history, pt last assessed in 2019 and admitted for manic behavior and psychosis. Pt reports he does have acces to knives at home but no history of violence. Pt does have criminal history was released from jail last year per pt's chart history in 2019 for non-violent offense. Pt presented calm with flat affect during assessment but did not present to be responding to delusional content or external stimuli.  Diagnosis: Bipolar I disorder, Current or most recent episode manic, With psychotic  features  Past Medical History:  Past Medical History:  Diagnosis Date  . Anxiety   . Bipolar 1 disorder ([place])   . Stroke [facility])     Past Surgical History:  Procedure Laterality Date  . CHOLECYSTECTOMY N/A 12/24/2012   Procedure: LAPAROSCOPIC CHOLECYSTECTOMY;  Surgeon: Gwenyth Ober, MD;  Location: [facility] OR;  Service: General;  Laterality: N/A;  . FINGER SURGERY      Family History:  Family History  Problem Relation Age of Onset  . Heart disease Mother     Social History:  reports that he has been smoking cigarettes. He has a 5.00 pack-year smoking history. He has never used smokeless tobacco. He reports current alcohol use of about 20.0 standard drinks of alcohol per week. He reports that he does not use drugs.  Additional Social History:  Alcohol / Drug Use Pain Medications: SEE MAR Prescriptions: SEE MAR Over the Counter: SEE MAR History of alcohol / drug use?: Yes  CIWA: CIWA-Ar BP: (!) 130/99 Pulse Rate: 76 COWS:    Allergies: No Known Allergies  Home Medications: (Not in a hospital admission)   OB/GYN Status:  No LMP for male patient.  General Assessment Data [facility] of Assessment: [facility] TTS Assessment: In system Is this a Tele or Face-to-Face Assessment?: Tele Assessment Is this an Initial Assessment or a Re-assessment for this encounter?: Initial Assessment Patient Accompanied by:: N/A Language Other than English: No Living Arrangements: Other (Comment) What gender do you identify as?: Male Marital status: Single Pregnancy Status: No Living Arrangements:  Parent Can pt return to current living arrangement?: Yes Admission Status: Voluntary Is patient capable of signing voluntary admission?: Yes Referral Source: Self/Family/Friend Insurance type: Medicaid     Crisis Care Plan Living Arrangements: Parent Legal Guardian: (self) Name of Psychiatrist: [facility] Name of Therapist: [facility]  Education Status Is patient  currently in school?: No Is the patient employed, unemployed or receiving disability?: Receiving disability income     Risk to Others within the past 6 months Homicidal Ideation: No Does patient have any lifetime risk of violence toward others beyond the six months prior to admission? : No Thoughts of Harm to Others: No Current Homicidal Intent: No Current Homicidal Plan: No Access to Homicidal Means: No Identified Victim: none History of harm to others?: No Assessment of Violence: None Noted Violent Behavior Description: none Does patient have access to weapons?: Yes (Comment) Criminal Charges Pending?: No Does patient have a court date: No Is patient on probation?: No  Psychosis Hallucinations: Auditory Delusions: None noted  Mental Status Report Appearance/Hygiene: In scrubs Eye Contact: Fair Motor Activity: Freedom of movement Speech: Logical/coherent Level of Consciousness: Alert Mood: Pleasant Affect: Appropriate to circumstance Anxiety Level: None Thought Processes: Coherent Judgement: Partial Orientation: Person, Time, Place, Situation Obsessive Compulsive Thoughts/Behaviors: None  Cognitive Functioning Concentration: Normal Memory: Recent Intact Is patient IDD: No Insight: Good Impulse Control: Poor Appetite: Poor Have you had any weight changes? : No Change Sleep: Increased Total Hours of Sleep: 8 Vegetative Symptoms: None  ADLScreening [facility] Assessment Services) Patient's cognitive ability adequate to safely complete daily activities?: Yes Patient able to express need for assistance with ADLs?: Yes Independently performs ADLs?: Yes (appropriate for developmental age)  Prior Inpatient Therapy Prior Inpatient Therapy: Yes Prior Therapy Dates: (2019) Prior Therapy Facilty/Provider(s): [facility])     ADL Screening (condition at time of admission) Patient's cognitive ability adequate to safely complete daily activities?: Yes Patient able to express  need for assistance with ADLs?: Yes Independently performs ADLs?: Yes (appropriate for developmental age)             Advance Directives (For Healthcare) Does Patient Have a Medical Advance Directive?: No Would patient like information on creating a medical advance directive?: No - Patient declined Nutrition Screen- [facility] Adult/[facility] Patient's home diet: Regular        Disposition: Nira Conn, FNP recommends pt meets inpatient criteria Per [place] [facility] Altes pt accepted to [facility] on adult unit,pending negative COVID test . TTS confirm with provider.    This service was provided via telemedicine using a 2-way, interactive audio and video technology.  Names of all persons participating in this telemedicine service and their role in this encounter. Name: Ercil Cassis Role: Patient  Name: Lacey Jensen Role: TTS  Name:  Role:   Name:  Role:     Natasha Mead 03/31/2019 1:45 AM

## 2019-03-31 NOTE — Progress Notes (Signed)
   03/31/19 2243  COVID-19 Daily Checkoff  Have you had a fever (temp > 37.80C/100F)  in the past 24 hours?  No  If you have had runny nose, nasal congestion, sneezing in the past 24 hours, has it worsened? No  COVID-19 EXPOSURE  Have you traveled outside the state in the past 14 days? No  Have you been in contact with someone with a confirmed diagnosis of COVID-19 or PUI in the past 14 days without wearing appropriate PPE? No  Have you been living in the same home as a person with confirmed diagnosis of COVID-19 or a PUI (household contact)? No  Have you been diagnosed with COVID-19? No

## 2019-03-31 NOTE — Progress Notes (Signed)
Patient is a 49 year old male who was brought into [facility] via EMS due to self- inflicted wound made with knife to left forearm. Pt denies this being a suicide attempt, stating, "I just wanted to see what kind of blood I bleed". Pt unable to identify any stressors prior to incident, and when asked stated, "I couldn't tell you". Pt reported that he lives with his mother and brother, but stated that his only support is his ACT team. Pt presents today with a flat affect, guarded behavior- poor eye contact- appears to be thought blocking at times as he can be slow to respond to questions.  Pt currently denies SI/HI and A/VH. Pt endorses drinking alcohol regularly- 2 beers daily. VS taken- Skin assessment revealed no abnormalities except for superficial laceration to left inner forearm. Belongings searched and secured in locker. Admission paperwork completed and signed. Verbal understanding expressed. Patient oriented to unit.

## 2019-03-31 NOTE — BHH Suicide Risk Assessment (Signed)
[facility] Admission Suicide Risk Assessment   Nursing information obtained from:  Patient Demographic factors:  Male, Low socioeconomic status, Caucasian Current Mental Status:  Self-harm behaviors Loss Factors:  NA Historical Factors:  Prior suicide attempts, Impulsivity Risk Reduction Factors:  Living with another person, especially a relative  Total Time spent with patient: 45 minutes Principal Problem: Schizoaffective disorder by history, substance-induced mood disorder, consider alcohol use disorder, cannabis use disorder Diagnosis:  Active Problems:   Substance induced mood disorder (HCC)  Subjective Data:  Continued Clinical Symptoms:  Alcohol Use Disorder Identification Test Final Score (AUDIT): 4 The "Alcohol Use Disorders Identification Test", Guidelines for Use in Primary Care, Second Edition.  World Science writer [facility]). Score between 0-7:  no or low risk or alcohol related problems. Score between 8-15:  moderate risk of alcohol related problems. Score between 16-19:  high risk of alcohol related problems. Score 20 or above:  warrants further diagnostic evaluation for alcohol dependence and treatment.   CLINICAL FACTORS:  49 year old male, presented to the emergency room on 2/5 after mother found him cutting his hand with a knife.  As per ED notes patient initially did acknowledge self-injurious/SI but at this time denies suicidal intent, states he was attempting to "get my mind off things" which he does not elaborate on.  He reports regular, often daily alcohol use up to 80 ounces of beer per day, regular cannabis use, and on day prior to admission had used amphetamines (Adderall) that he had obtained- not prescribed, but denies pattern of stimulant abuse.  He is followed by an act team and is on Invega depot IM every 3 months, states last injection was about 2 weeks ago    Psychiatric Specialty Exam: Physical Exam  Review of Systems  Blood pressure 113/85, pulse 98,  temperature (!) 97.4 F (36.3 C), temperature source Oral, resp. rate 16, height 5\' 9"  (1.753 m), weight 72.1 kg, SpO2 99 %.Body mass index is 23.48 kg/m.  See admit note MSE    COGNITIVE FEATURES THAT CONTRIBUTE TO RISK:  Closed-mindedness and Loss of executive function    SUICIDE RISK:   Moderate:  Frequent suicidal ideation with limited intensity, and duration, some specificity in terms of plans, no associated intent, good self-control, limited dysphoria/symptomatology, some risk factors present, and identifiable protective factors, including available and accessible social support.  PLAN OF CARE: Patient will be admitted to inpatient psychiatric unit for stabilization and safety. Will provide and encourage milieu participation. Provide medication management and maked adjustments as needed.  We will also provide medication management to address withdrawal symptoms if needed- will follow daily.    I certify that inpatient services furnished can reasonably be expected to improve the patient's condition.   , MD 03/31/2019, 5:04 PM

## 2019-03-31 NOTE — Tx Team (Signed)
Initial Treatment Plan 03/31/2019 11:35 AM [name] NZD:[id number]    PATIENT STRESSORS: Other: unable to identify   PATIENT STRENGTHS: Average or above average intelligence Supportive family/friends   PATIENT IDENTIFIED PROBLEMS:        "I couldn't tell you"     "I wanted to see what kind of blood I bleed".           DISCHARGE CRITERIA:  Improved stabilization in mood, thinking, and/or behavior Need for constant or close observation no longer present Reduction of life-threatening or endangering symptoms to within safe limits  PRELIMINARY DISCHARGE PLAN: Outpatient therapy Return to previous living arrangement  PATIENT/FAMILY INVOLVEMENT: This treatment plan has been presented to and reviewed with the patient, [name],  The patient  has been given the opportunity to ask questions and make suggestions.  Shela Nevin, RN 03/31/2019, 11:35 AM
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
## 2019-03-31 NOTE — Progress Notes (Signed)
   03/31/19 2243  COVID-19 Daily Checkoff  Have you had a fever (temp > 37.80C/100F)  in the past 24 hours?  No  If you have had runny nose, nasal congestion, sneezing in the past 24 hours, has it worsened? No  COVID-19 EXPOSURE  Have you traveled outside the state in the past 14 days? No  Have you been in contact with someone with a confirmed diagnosis of COVID-19 or PUI in the past 14 days without wearing appropriate PPE? No  Have you been living in the same home as a person with confirmed diagnosis of COVID-19 or a PUI (household contact)? No  Have you been diagnosed with COVID-19? No   

## 2019-03-31 NOTE — [facility] Group Notes (Signed)
Adult Psychoeducational Group Note  Date:  03/31/2019 Time:  12:22 PM  Group Topic/Focus:  Goals Group:   The focus of this group is to help patients establish daily goals to achieve during treatment and discuss how the patient can incorporate goal setting into their daily lives to aide in recovery.  Participation Level:  Did Not Attend   Fabrice Dyal A 03/31/2019, 12:22 PM 

## 2019-03-31 NOTE — Consult Note (Addendum)
Patient is accepted to [facility] 305.2.

## 2019-03-31 NOTE — Discharge Instructions (Signed)

## 2019-03-31 NOTE — Progress Notes (Signed)
   03/31/19 2247  Psych Admission Type (Psych Patients Only)  Admission Status Voluntary  Psychosocial Assessment  Patient Complaints Depression  Eye Contact Brief  Facial Expression Flat  Affect Appropriate to circumstance  Speech Logical/coherent  Interaction Minimal  Motor Activity Other (Comment) (WDL)  Appearance/Hygiene In scrubs  Behavior Characteristics Appropriate to situation  Mood Depressed  Thought Process  Coherency WDL  Content WDL  Delusions None reported or observed  Perception WDL  Hallucination None reported or observed  Judgment Poor  Confusion None  Danger to Self  Current suicidal ideation? Denies  Danger to Others  Danger to Others None reported or observed

## 2019-04-01 MED ORDER — RISPERIDONE 1 MG PO TABS
1.0000 mg | ORAL_TABLET | ORAL | Status: DC
  Filled 2019-04-01 (×4): qty 1

## 2019-04-01 MED ORDER — RISPERIDONE 2 MG PO TABS
2.0000 mg | ORAL_TABLET | Freq: Every day | ORAL | Status: DC
  Filled 2019-04-01 (×5): qty 1

## 2019-04-01 NOTE — Progress Notes (Signed)
   04/01/19 2244  Psych Admission Type (Psych Patients Only)  Admission Status Voluntary  Psychosocial Assessment  Patient Complaints None  Eye Contact Brief  Facial Expression Flat  Affect Appropriate to circumstance  Speech Logical/coherent  Interaction Minimal  Motor Activity Other (Comment) (WDL)  Appearance/Hygiene In scrubs  Behavior Characteristics Appropriate to situation  Mood Depressed  Thought Process  Coherency WDL  Content WDL  Delusions None reported or observed  Perception WDL  Hallucination None reported or observed  Judgment Poor  Confusion None  Danger to Self  Current suicidal ideation? Denies  Danger to Others  Danger to Others None reported or observed

## 2019-04-01 NOTE — [facility] Group Notes (Signed)
[facility] LCSW Group Therapy Note  Date/Time:  04/01/2019 10:00-11:00AM  Type of Therapy and Topic:  Group Therapy:  Healthy and Unhealthy Supports  Participation Level:  Did Not Attend   Description of Group:  Patients in this group were introduced to the idea of adding a variety of healthy supports to address the various needs in their lives.Patients discussed what additional healthy supports could be helpful in their recovery and wellness after discharge in order to prevent future hospitalizations.   An emphasis was placed on using counselor, doctor, therapy groups, 12-step groups, and problem-specific support groups to expand supports.  Several songs were played to emphasize points made throughout group.  Therapeutic Goals:   1)  discuss importance of adding supports to stay well once out of the hospital  2)  compare healthy versus unhealthy supports and identify some examples of each  3)  generate ideas and descriptions of healthy supports that can be added  4)  offer mutual support about how to address unhealthy supports  5)  encourage active participation in and adherence to discharge plan    Summary of Patient Progress:  Did not attend   Therapeutic Modalities:   Motivational Interviewing Brief Solution-Focused Therapy  Ambrose Mantle, LCSW

## 2019-04-01 NOTE — Progress Notes (Signed)
   04/01/19 2241  COVID-19 Daily Checkoff  Have you had a fever (temp > 37.80C/100F)  in the past 24 hours?  No  If you have had runny nose, nasal congestion, sneezing in the past 24 hours, has it worsened? No  COVID-19 EXPOSURE  Have you traveled outside the state in the past 14 days? No  Have you been in contact with someone with a confirmed diagnosis of COVID-19 or PUI in the past 14 days without wearing appropriate PPE? No  Have you been living in the same home as a person with confirmed diagnosis of COVID-19 or a PUI (household contact)? No  Have you been diagnosed with COVID-19? No

## 2019-04-01 NOTE — [facility] Group Notes (Signed)
Adult Psychoeducational Group Note  Date:  04/01/2019 Time:  3:46 PM  Group Topic/Focus:  Identifying Needs:   The focus of this group is to help patients identify their personal needs that have been historically problematic and identify healthy behaviors to address their needs.  Participation Level:  Did Not Attend   Dione Housekeeper 04/01/2019, 3:46 PM

## 2019-04-01 NOTE — Progress Notes (Signed)
Pt reported not feeling well this morning but would not elaborate on how he was feeling. Morning medications were held and will be administered at 12 pm per pt request. Pt denies SI/HI. Pt denies AVH.   Orders reviewed. V/s assessed. 15 minute checks performed for safety.   Pt requesting to know when he will be able to discharge.     04/01/19 1104  Psych Admission Type (Psych Patients Only)  Admission Status Voluntary  Psychosocial Assessment  Patient Complaints None  Eye Contact Brief  Facial Expression Flat  Affect Appropriate to circumstance  Speech Logical/coherent  Interaction Minimal  Motor Activity Slow  Appearance/Hygiene In scrubs  Behavior Characteristics Appropriate to situation  Mood Depressed  Aggressive Behavior  Effect No apparent injury  Thought Process  Coherency WDL  Content WDL  Delusions None reported or observed  Perception WDL  Hallucination None reported or observed  Judgment Poor  Confusion None  Danger to Self  Current suicidal ideation? Denies  Danger to Others  Danger to Others None reported or observed

## 2019-04-01 NOTE — Progress Notes (Addendum)
[facility] MD Progress Note  04/01/2019 1:01 PM [name]  MRN:  [id number] Subjective:  Patient states " I am doing OK today". He denies feeling depressed at this time, and is hopeful for discharge soon. Currently denies SI.  Objective : I have reviewed case and have met with patient. 49 year old male, presented to the emergency room on 2/5 after mother found him cutting his hand with a knife.  As per ED notes patient initially did acknowledge self-injurious/SI but at this time denies suicidal intent, states he was attempting to "get my mind off things" which he does not elaborate on.  He reports regular, often daily alcohol use up to 80 ounces of beer per day, regular cannabis use, and on day prior to admission had used amphetamines (Adderall) that he had obtained- not prescribed, but denies pattern of stimulant abuse.  He is followed by an act team and is on Invega depot IM every 3 months, states last injection was about 2 weeks ago.  Currently patient reports " I am doing OK", and at this time denies feeling depressed . He presents with a vaguely guarded, flat affect . Denies suicidal ideations or any self injurious ideations at this time. Denies psychotic symptoms, and does not currently present internally preoccupied. Currently does not endorse symptoms of WDL and does not appear to be in any discomfort or distress. No distal tremors, no diaphoresis, no psychomotor restlessness .  As per RN report , no disruptive or agitated behaviors on unit, limited group/milieu participation, with limited interaction with peers . Denies medication side effects. ( Has been onlong acting Invega IM injection Q 3 months.  Have continued him on oral Risperidone ) With patient's consent I spoke with his mother via phone- she corroborates that she found him with knife attempting to cut on his hand. She states that she has been concerned about his drinking and that he has been drinking regularly, daily. She also suspects he  has been " abusing pills" but not sure which.   Principal Problem: Schizoaffective Disorder by history, Alcohol Use Disorder, Cannabis Use Disorder Diagnosis: Active Problems:   Substance induced mood disorder ([place])  Total Time spent with patient: 20 minutes  Past Psychiatric History:   Past Medical History:  Past Medical History:  Diagnosis Date  . Anxiety   . Bipolar 1 disorder ([place])   . Stroke [facility])     Past Surgical History:  Procedure Laterality Date  . CHOLECYSTECTOMY N/A 12/24/2012   Procedure: LAPAROSCOPIC CHOLECYSTECTOMY;  Surgeon: Gwenyth Ober, MD;  Location: [facility] OR;  Service: General;  Laterality: N/A;  . FINGER SURGERY     Family History:  Family History  Problem Relation Age of Onset  . Heart disease Mother    Family Psychiatric  History:  Social History:  Social History   Substance and Sexual Activity  Alcohol Use Yes  . Alcohol/week: 20.0 standard drinks  . Types: 20 Cans of beer per week   Comment: BAC was clear     Social History   Substance and Sexual Activity  Drug Use No  . Types: Marijuana   Comment: UDS was clear    Social History   Socioeconomic History  . Marital status: Single    Spouse name: Not on file  . Number of children: Not on file  . Years of education: Not on file  . Highest education level: Not on file  Occupational History  . Not on file  Tobacco Use  . Smoking  status: Current Every Day Smoker    Packs/day: 1.00    Years: 5.00    Pack years: 5.00    Types: Cigarettes  . Smokeless tobacco: Never Used  Substance and Sexual Activity  . Alcohol use: Yes    Alcohol/week: 20.0 standard drinks    Types: 20 Cans of beer per week    Comment: BAC was clear  . Drug use: No    Types: Marijuana    Comment: UDS was clear  . Sexual activity: Never    Birth control/protection: Condom  Other Topics Concern  . Not on file  Social History Narrative  . Not on file   Social Determinants of Health   Financial Resource Strain:    . Difficulty of Paying Living Expenses: Not on file  Food Insecurity:   . Worried About Charity fundraiser in the Last Year: Not on file  . Ran Out of Food in the Last Year: Not on file  Transportation Needs:   . Lack of Transportation (Medical): Not on file  . Lack of Transportation (Non-Medical): Not on file  Physical Activity:   . Days of Exercise per Week: Not on file  . Minutes of Exercise per Session: Not on file  Stress:   . Feeling of Stress : Not on file  Social Connections:   . Frequency of Communication with Friends and Family: Not on file  . Frequency of Social Gatherings with Friends and Family: Not on file  . Attends Religious Services: Not on file  . Active Member of Clubs or Organizations: Not on file  . Attends Archivist Meetings: Not on file  . Marital Status: Not on file   Additional Social History:   Sleep: Good  Appetite:  Good  Current Medications: Current Facility-Administered Medications  Medication Dose Route Frequency Provider Last Rate Last Admin  . acetaminophen (TYLENOL) tablet 650 mg  650 mg Oral Q6H PRN Rozetta Nunnery, NP      . alum & mag hydroxide-simeth (MAALOX/MYLANTA) 200-200-20 MG/5ML suspension 30 mL  30 mL Oral Q4H PRN Lindon Romp A, NP      . feeding supplement (ENSURE ENLIVE) (ENSURE ENLIVE) liquid 237 mL  237 mL Oral BID BM Cherina Dhillon, Myer Peer, MD   237 mL at 03/31/19 1520  . hydrOXYzine (ATARAX/VISTARIL) tablet 25 mg  25 mg Oral Q6H PRN Sanyia Dini, Myer Peer, MD      . LORazepam (ATIVAN) tablet 1 mg  1 mg Oral Q6H PRN Jeanene Mena, Myer Peer, MD      . magnesium hydroxide (MILK OF MAGNESIA) suspension 30 mL  30 mL Oral Daily PRN Lindon Romp A, NP      . multivitamin with minerals tablet 1 tablet  1 tablet Oral Daily Chayson Charters, Myer Peer, MD   1 tablet at 04/01/19 1208  . risperiDONE (RISPERDAL) tablet 1 mg  1 mg Oral BID Kanani Mowbray, Myer Peer, MD   1 mg at 04/01/19 1208  . thiamine tablet 100 mg  100 mg Oral Daily Jocelyn Nold, Myer Peer, MD    100 mg at 04/01/19 1208  . traZODone (DESYREL) tablet 50 mg  50 mg Oral QHS PRN Rozetta Nunnery, NP        Lab Results:  Results for orders placed or performed during the hospital encounter of 03/30/19 (from the past 48 hour(s))  Comprehensive metabolic panel     Status: Abnormal   Collection Time: 03/30/19  8:24 PM  Result Value Ref Range   Sodium  141 135 - 145 mmol/L   Potassium 3.7 3.5 - 5.1 mmol/L   Chloride 104 98 - 111 mmol/L   CO2 26 22 - 32 mmol/L   Glucose, Bld 114 (H) 70 - 99 mg/dL   BUN 6 6 - 20 mg/dL   Creatinine, Ser 0.92 0.61 - 1.24 mg/dL   Calcium 9.3 8.9 - 10.3 mg/dL   Total Protein 7.8 6.5 - 8.1 g/dL   Albumin 4.3 3.5 - 5.0 g/dL   AST 19 15 - 41 U/L   ALT 20 0 - 44 U/L   Alkaline Phosphatase 75 38 - 126 U/L   Total Bilirubin 0.9 0.3 - 1.2 mg/dL   GFR calc non Af Amer >60 >60 mL/min   GFR calc Af Amer >60 >60 mL/min   Anion gap 11 5 - 15    Comment: Performed at [facility], [place] [address], [place], [place] [address]  Ethanol     Status: None   Collection Time: 03/30/19  8:24 PM  Result Value Ref Range   Alcohol, Ethyl (B) <10 <10 mg/dL    Comment: (NOTE) Lowest detectable limit for serum alcohol is 10 mg/dL. For medical purposes only. Performed at [facility], [place] [address] [place], [place] [address]   Salicylate level     Status: Abnormal   Collection Time: 03/30/19  8:24 PM  Result Value Ref Range   Salicylate Lvl <5.7 (L) 7.0 - 30.0 mg/dL    Comment: Performed at [facility], [place] [address] [place], [place] [address]  Acetaminophen level     Status: Abnormal   Collection Time: 03/30/19  8:24 PM  Result Value Ref Range   Acetaminophen (Tylenol), Serum <10 (L) 10 - 30 ug/mL    Comment: (NOTE) Therapeutic concentrations vary significantly. A range of 10-30 ug/mL  may be an effective concentration for many patients. However, some  are best treated at concentrations outside of this  range. Acetaminophen concentrations >150 ug/mL at 4 hours after ingestion  and >50 ug/mL at 12 hours after ingestion are often associated with  toxic reactions. Performed at [facility], [place] [address], [place], [place] [address]   cbc     Status: None   Collection Time: 03/30/19  8:24 PM  Result Value Ref Range   WBC 8.2 4.0 - 10.5 K/uL   RBC 4.46 4.22 - 5.81 MIL/uL   Hemoglobin 14.7 13.0 - 17.0 g/dL   HCT 42.8 39.0 - 52.0 %   MCV 96.0 80.0 - 100.0 fL   MCH 33.0 26.0 - 34.0 pg   MCHC 34.3 30.0 - 36.0 g/dL   RDW 11.9 11.5 - 15.5 %   Platelets 234 150 - 400 K/uL   nRBC 0.0 0.0 - 0.2 %    Comment: Performed at [facility], [place] [address], [place], [place] [address]  Rapid urine drug screen (hospital performed)     Status: Abnormal   Collection Time: 03/30/19  8:24 PM  Result Value Ref Range   Opiates NONE DETECTED NONE DETECTED   Cocaine NONE DETECTED NONE DETECTED   Benzodiazepines NONE DETECTED NONE DETECTED   Amphetamines POSITIVE (A) NONE DETECTED   Tetrahydrocannabinol NONE DETECTED NONE DETECTED   Barbiturates NONE DETECTED NONE DETECTED    Comment: (NOTE) DRUG SCREEN FOR MEDICAL PURPOSES ONLY.  IF CONFIRMATION IS NEEDED FOR ANY PURPOSE, NOTIFY LAB WITHIN 5 DAYS. LOWEST DETECTABLE LIMITS FOR URINE DRUG SCREEN Drug Class  Cutoff (ng/mL) Amphetamine and metabolites    1000 Barbiturate and metabolites    200 Benzodiazepine                 211 Tricyclics and metabolites     300 Opiates and metabolites        300 Cocaine and metabolites        300 THC                            50 Performed at [facility], [place] [address], [place], [place] [address]   Respiratory Panel by PCR     Status: None   Collection Time: 03/30/19 11:02 PM   Specimen: Nasopharyngeal Swab; Respiratory  Result Value Ref Range   Adenovirus NOT DETECTED NOT DETECTED   Coronavirus 229E NOT DETECTED NOT DETECTED     Comment: (NOTE) The Coronavirus on the Respiratory Panel, DOES NOT test for the novel  Coronavirus (2019 nCoV)    Coronavirus HKU1 NOT DETECTED NOT DETECTED   Coronavirus NL63 NOT DETECTED NOT DETECTED   Coronavirus OC43 NOT DETECTED NOT DETECTED   Metapneumovirus NOT DETECTED NOT DETECTED   Rhinovirus / Enterovirus NOT DETECTED NOT DETECTED   Influenza A NOT DETECTED NOT DETECTED   Influenza B NOT DETECTED NOT DETECTED   Parainfluenza Virus 1 NOT DETECTED NOT DETECTED   Parainfluenza Virus 2 NOT DETECTED NOT DETECTED   Parainfluenza Virus 3 NOT DETECTED NOT DETECTED   Parainfluenza Virus 4 NOT DETECTED NOT DETECTED   Respiratory Syncytial Virus NOT DETECTED NOT DETECTED   Bordetella pertussis NOT DETECTED NOT DETECTED   Chlamydophila pneumoniae NOT DETECTED NOT DETECTED   Mycoplasma pneumoniae NOT DETECTED NOT DETECTED    Comment: Performed at [facility], [place] [address] [place], [place] [address]  Respiratory Panel by RT PCR (Flu A&B, Covid) - Nasopharyngeal Swab     Status: None   Collection Time: 03/30/19 11:59 PM   Specimen: Nasopharyngeal Swab  Result Value Ref Range   SARS Coronavirus 2 by RT PCR NEGATIVE NEGATIVE    Comment: (NOTE) SARS-CoV-2 target nucleic acids are NOT DETECTED. The SARS-CoV-2 RNA is generally detectable in upper respiratoy specimens during the acute phase of infection. The lowest concentration of SARS-CoV-2 viral copies this assay can detect is 131 copies/mL. A negative result does not preclude SARS-Cov-2 infection and should not be used as the sole basis for treatment or other patient management decisions. A negative result may occur with  improper specimen collection/handling, submission of specimen other than nasopharyngeal swab, presence of viral mutation(s) within the areas targeted by this assay, and inadequate number of viral copies (<131 copies/mL). A negative result must be combined with clinical observations, patient history, and  epidemiological information. The expected result is Negative. Fact Sheet for Patients:  PinkCheek.be Fact Sheet for Healthcare Providers:  GravelBags.it This test is not yet ap proved or cleared by the [place] FDA and  has been authorized for detection and/or diagnosis of SARS-CoV-2 by FDA under an Emergency Use Authorization (EUA). This EUA will remain  in effect (meaning this test can be used) for the duration of the COVID-19 declaration under Section 564(b)(1) of the Act, 21 U.S.C. section 360bbb-3(b)(1), unless the authorization is terminated or revoked sooner.    Influenza A by PCR NEGATIVE NEGATIVE   Influenza B by PCR NEGATIVE NEGATIVE    Comment: (NOTE) The Xpert Xpress SARS-CoV-2/FLU/RSV assay is intended as an aid in  the diagnosis of influenza from Nasopharyngeal swab specimens and  should not be used as a sole basis for treatment. Nasal washings and  aspirates are unacceptable for Xpert Xpress SARS-CoV-2/FLU/RSV  testing. Fact Sheet for Patients: PinkCheek.be Fact Sheet for Healthcare Providers: GravelBags.it This test is not yet approved or cleared by the [place] FDA and  has been authorized for detection and/or diagnosis of SARS-CoV-2 by  FDA under an Emergency Use Authorization (EUA). This EUA will remain  in effect (meaning this test can be used) for the duration of the  Covid-19 declaration under Section 564(b)(1) of the Act, 21  U.S.C. section 360bbb-3(b)(1), unless the authorization is  terminated or revoked. Performed at [facility], [place] [address], [place], [place] [address]     Blood Alcohol level:  Lab Results  Component Value Date   ETH <10 03/30/2019   ETH <10 86/76/1948    Metabolic Disorder Labs: No results found for: HGBA1C, MPG No results found for: PROLACTIN No results found for: CHOL, TRIG, HDL, CHOLHDL,  VLDL, LDLCALC  Physical Findings: AIMS: Facial and Oral Movements Muscles of Facial Expression: None, normal Lips and Perioral Area: None, normal Jaw: None, normal Tongue: None, normal,Extremity Movements Upper (arms, wrists, hands, fingers): None, normal Lower (legs, knees, ankles, toes): None, normal, Trunk Movements Neck, shoulders, hips: None, normal, Overall Severity Severity of abnormal movements (highest score from questions above): None, normal Incapacitation due to abnormal movements: None, normal Patient's awareness of abnormal movements (rate only patient's report): No Awareness, Dental Status Current problems with teeth and/or dentures?: No Does patient usually wear dentures?: No  CIWA:  CIWA-Ar Total: 2 COWS:     Musculoskeletal: Strength & Muscle Tone: within normal limits no significant tremors or diaphoresis at this time, does not present restless or agitated Gait & Station: normal Patient leans: N/A  Psychiatric Specialty Exam: Physical Exam  Review of Systems  Denies headache, no chest pain, no shortness of breath, no nausea or vomiting   Blood pressure 113/85, pulse 98, temperature (!) 97.4 F (36.3 C), temperature source Oral, resp. rate 16, height '5\' 9"'  (1.753 m), weight 72.1 kg, SpO2 99 %.Body mass index is 23.48 kg/m.  General Appearance: Fairly Groomed  Eye Contact:  Fair, improves during session  Speech:  Normal Rate  Volume:  Normal  Mood:  reports feeling all right and currently denies feeling depressed   Affect:  blunted  Thought Process:  Linear and Descriptions of Associations: Circumstantial  Orientation:  Other:  fully alert and attentive  Thought Content:  denies hallucinations, no delusions are currently expressed , not internally preoccupied   Suicidal Thoughts:  No currently denies suicidal or self injurious ideations, denies homicidal or violent ideations, contracts for safety on unit at this time  Homicidal Thoughts:  No  Memory:  recent  and remote fair   Judgement:  Fair/ improving  Insight:  Fair  Psychomotor Activity:  Normal not presenting with distal tremors, agitation , or restlessness at this time  Concentration:  Concentration: Fair and Attention Span: Fair  Recall:  [employer] of Knowledge:  Fair  Language:  Good  Akathisia:  Negative  Handed:  Right  AIMS (if indicated):     Assets:  Desire for Improvement Resilience  ADL's:  Intact  Cognition:  WNL  Sleep:  Number of Hours: 6.75   Assessment -  49 year old male, presented to the emergency room on 2/5 after mother found him cutting his hand with a knife.  As  per ED notes patient initially did acknowledge self-injurious/SI but at this time denies suicidal intent, states he was attempting to "get my mind off things" which he does not elaborate on.  He reports regular, often daily alcohol use up to 80 ounces of beer per day, regular cannabis use, and on day prior to admission had used amphetamines (Adderall) that he had obtained- not prescribed, but denies pattern of stimulant abuse.  He is followed by an act team and is on Invega depot IM every 3 months, states last injection was about 2 weeks ago.  Today patient reports he is doing well and does not endorse significant depression or any psychotic symptoms. He denies SI or self injurious ideations. He presents guarded, with blunted affect and limited answers ( short phrases or monosyllables ). Mother reports patient has been drinking regularly and she suspects he has been also abusing " pills", but cannot specify. Patient is not presenting with symptoms of WDL at this time and presents calm and in no acute distress  Treatment Plan Summary: Daily contact with patient to assess and evaluate symptoms and progress in treatment, Medication management, Plan inpatient treatment  and medications as below Encourage group and milieu participation Encourage sobriety, abstinence Treatment team working on disposition planning  options, patient is followed by ACT team Increase Risperidone to 1 mgr QAM and 2 mgrs QHS for psychosis/mood disorder  Continue Ativan as needed for alcohol WDL as per CIWA protocol Continue Thiamine , MVI supplementation Continue Trazodone 50 mgrs QHS PRN for insomnia  Jenne Campus, MD 04/01/2019, 1:01 PM

## 2019-04-02 LAB — LIPID PANEL
Cholesterol: 165 mg/dL (ref 0–200)
HDL: 56 mg/dL (ref >40)
LDL Cholesterol: 68 mg/dL (ref 0–99)
Total CHOL/HDL Ratio: 2.9 RATIO
Triglycerides: 203 mg/dL — ABNORMAL HIGH (ref <150)
VLDL: 41 mg/dL — ABNORMAL HIGH (ref 0–40)

## 2019-04-02 LAB — TSH: TSH: 1.048 u[IU]/mL (ref 0.350–4.500)

## 2019-04-02 NOTE — Tx Team (Signed)
Interdisciplinary Treatment and Diagnostic Plan Update  04/02/2019 Time of Session: [place] MRN: [id number]  Principal Diagnosis: <principal problem not specified>  Secondary Diagnoses: Active Problems:   Substance induced mood disorder (HCC)   Current Medications:  Current Facility-Administered Medications  Medication Dose Route Frequency Provider Last Rate Last Admin  . acetaminophen (TYLENOL) tablet 650 mg  650 mg Oral Q6H PRN Rozetta Nunnery, NP      . alum & mag hydroxide-simeth (MAALOX/MYLANTA) 200-200-20 MG/5ML suspension 30 mL  30 mL Oral Q4H PRN Lindon Romp A, NP      . feeding supplement (ENSURE ENLIVE) (ENSURE ENLIVE) liquid 237 mL  237 mL Oral BID BM Cobos, Myer Peer, MD   237 mL at 03/31/19 1520  . hydrOXYzine (ATARAX/VISTARIL) tablet 25 mg  25 mg Oral Q6H PRN Cobos, Myer Peer, MD      . LORazepam (ATIVAN) tablet 1 mg  1 mg Oral Q6H PRN Cobos, Myer Peer, MD      . magnesium hydroxide (MILK OF MAGNESIA) suspension 30 mL  30 mL Oral Daily PRN Lindon Romp A, NP      . multivitamin with minerals tablet 1 tablet  1 tablet Oral Daily Cobos, Myer Peer, MD   1 tablet at 04/02/19 [phone]  . risperiDONE (RISPERDAL) tablet 1 mg  1 mg Oral BH-q7a Cobos, Fernando A, MD      . risperiDONE (RISPERDAL) tablet 2 mg  2 mg Oral QHS Cobos, Fernando A, MD      . thiamine tablet 100 mg  100 mg Oral Daily Cobos, Myer Peer, MD   100 mg at 04/02/19 0852  . traZODone (DESYREL) tablet 50 mg  50 mg Oral QHS PRN Rozetta Nunnery, NP       PTA Medications: Medications Prior to Admission  Medication Sig Dispense Refill Last Dose  . carbamazepine (TEGRETOL XR) 200 MG 12 hr tablet Take 1 tablet (200 mg total) by mouth 2 (two) times daily. (Patient not taking: Reported on 03/30/2019) 60 tablet 0   . risperiDONE (RISPERDAL) 1 MG tablet Take 1 tablet (1 mg total) by mouth 2 (two) times daily. (Patient not taking: Reported on 03/30/2019) 60 tablet 0     Patient Stressors: Other: unable to  identify  Patient Strengths: Average or above average intelligence Supportive family/friends  Treatment Modalities: Medication Management, Group therapy, Case management,  1 to 1 session with clinician, Psychoeducation, Recreational therapy.   Physician Treatment Plan for Primary Diagnosis: <principal problem not specified> Long Term Goal(s): Improvement in symptoms so as ready for discharge Improvement in symptoms so as ready for discharge   Short Term Goals: Ability to identify changes in lifestyle to reduce recurrence of condition will improve Ability to verbalize feelings will improve Ability to disclose and discuss suicidal ideas Ability to demonstrate self-control will improve Ability to identify and develop effective coping behaviors will improve Ability to maintain clinical measurements within normal limits will improve Compliance with prescribed medications will improve Ability to identify changes in lifestyle to reduce recurrence of condition will improve Ability to verbalize feelings will improve Ability to disclose and discuss suicidal ideas Ability to demonstrate self-control will improve Ability to identify and develop effective coping behaviors will improve Ability to maintain clinical measurements within normal limits will improve  Medication Management: Evaluate patient's response, side effects, and tolerance of medication regimen.  Therapeutic Interventions: 1 to 1 sessions, Unit Group sessions and Medication administration.  Evaluation of Outcomes: Not Met  Physician Treatment Plan for  Secondary Diagnosis: Active Problems:   Substance induced mood disorder (HCC)  Long Term Goal(s): Improvement in symptoms so as ready for discharge Improvement in symptoms so as ready for discharge   Short Term Goals: Ability to identify changes in lifestyle to reduce recurrence of condition will improve Ability to verbalize feelings will improve Ability to disclose and  discuss suicidal ideas Ability to demonstrate self-control will improve Ability to identify and develop effective coping behaviors will improve Ability to maintain clinical measurements within normal limits will improve Compliance with prescribed medications will improve Ability to identify changes in lifestyle to reduce recurrence of condition will improve Ability to verbalize feelings will improve Ability to disclose and discuss suicidal ideas Ability to demonstrate self-control will improve Ability to identify and develop effective coping behaviors will improve Ability to maintain clinical measurements within normal limits will improve     Medication Management: Evaluate patient's response, side effects, and tolerance of medication regimen.  Therapeutic Interventions: 1 to 1 sessions, Unit Group sessions and Medication administration.  Evaluation of Outcomes: Not Met   RN Treatment Plan for Primary Diagnosis: <principal problem not specified> Long Term Goal(s): Knowledge of disease and therapeutic regimen to maintain health will improve  Short Term Goals: Ability to participate in decision making will improve, Ability to verbalize feelings will improve, Ability to disclose and discuss suicidal ideas, Ability to identify and develop effective coping behaviors will improve and Compliance with prescribed medications will improve  Medication Management: RN will administer medications as ordered by provider, will assess and evaluate patient's response and provide education to patient for prescribed medication. RN will report any adverse and/or side effects to prescribing provider.  Therapeutic Interventions: 1 on 1 counseling sessions, Psychoeducation, Medication administration, Evaluate responses to treatment, Monitor vital signs and CBGs as ordered, Perform/monitor CIWA, COWS, AIMS and Fall Risk screenings as ordered, Perform wound care treatments as ordered.  Evaluation of Outcomes: Not  Met   LCSW Treatment Plan for Primary Diagnosis: <principal problem not specified> Long Term Goal(s): Safe transition to appropriate next level of care at discharge, Engage patient in therapeutic group addressing interpersonal concerns.  Short Term Goals: Engage patient in aftercare planning with referrals and resources  Therapeutic Interventions: Assess for all discharge needs, 1 to 1 time with Social worker, Explore available resources and support systems, Assess for adequacy in community support network, Educate family and significant other(s) on suicide prevention, Complete Psychosocial Assessment, Interpersonal group therapy.  Evaluation of Outcomes: Not Met   Progress in Treatment: Attending groups: No. Participating in groups: No. Taking medication as prescribed: Yes. Toleration medication: Yes. Family/Significant other contact made: No, will contact:  if patient consents to collateral contacts Patient understands diagnosis: Yes. Discussing patient identified problems/goals with staff: Yes. Medical problems stabilized or resolved: Yes. Denies suicidal/homicidal ideation: Yes. Issues/concerns per patient self-inventory: No. Other:   New problem(s) identified: None   New Short Term/Long Term Goal(s): medication stabilization, elimination of SI thoughts, development of comprehensive mental wellness plan.    Patient Goals:  "I don't have a goal. I just want to discharge.   Discharge Plan or Barriers: Patient recently admitted. CSW will continue to follow and assess for appropriate referrals and possible discharge planning.    Reason for Continuation of Hospitalization: Medication stabilization Suicidal ideation  Estimated Length of Stay: 3-5 days   Attendees: Patient: [name]  04/02/2019 10:34 AM  Physician: Dr. Neita Garnet, MD 04/02/2019 10:34 AM  Nursing:  04/02/2019 10:34 AM  RN Care Manager: 04/02/2019 10:34  AM  Social Worker: Radonna Ricker LCSW 04/02/2019 10:34 AM   Recreational Therapist:  04/02/2019 10:34 AM  Other:  04/02/2019 10:34 AM  Other:  04/02/2019 10:34 AM  Other: 04/02/2019 10:34 AM    Scribe for Treatment Team: Marylee Floras,  04/02/2019 10:34 AM

## 2019-04-02 NOTE — Progress Notes (Signed)
   04/02/19 2200  Psych Admission Type (Psych Patients Only)  Admission Status Voluntary  Psychosocial Assessment  Patient Complaints None  Eye Contact Brief  Facial Expression Flat  Affect Appropriate to circumstance  Speech Logical/coherent  Interaction Minimal  Motor Activity Other (Comment) (WDL)  Appearance/Hygiene In scrubs  Behavior Characteristics Appropriate to situation  Mood Depressed  Thought Process  Coherency WDL  Content WDL  Delusions None reported or observed  Perception WDL  Hallucination None reported or observed  Judgment Poor  Confusion None  Danger to Self  Current suicidal ideation? Denies  Danger to Others  Danger to Others None reported or observed

## 2019-04-02 NOTE — [facility] Suicide Risk Assessment (Signed)
[facility] INPATIENT:  Family/Significant Other Suicide Prevention Education  Suicide Prevention Education:  Patient Refusal for Family/Significant Other Suicide Prevention Education: The patient [name] has refused to provide written consent for family/significant other to be provided Family/Significant Other Suicide Prevention Education during admission and/or prior to discharge.  Physician notified.  Darreld Mclean 04/02/2019, 10:29 AM

## 2019-04-02 NOTE — [facility] Group Notes (Signed)
LCSW Group Therapy Notes  Type of Therapy and Topic: Group Therapy: Healthy Vs. Unhealthy Coping Strategies  Participation Level: [facility] PARTICIPATION LEVEL: Active  Description of Group: In this group, patients will be encouraged to explore their healthy and unhealthy coping strategics. Coping strategies are actions that we take to deal with stress, problems, or uncomfortable emotions in our daily lives. Each patient will be challenged to read some scenarios and discuss the unhealthy and healthy coping strategies within those scenarios. Also, each patient will be challenged to describe current healthy and unhealthy strategies that they use in their own lives and discuss the outcomes and barriers to those strategies. This group will be process-oriented, with patients participating in exploration of their own experiences as well as giving and receiving support and challenge from other group members.  Therapeutic Goals: Patient will identify personal healthy and unhealthy coping strategies. Patient will identify healthy and unhealthy coping strategies, in others, through scenarios. Patient will identify expected outcomes of healthy and unhealthy coping strategies. Patient will identify barriers to using healthy coping strategies.  Summary of Patient Progress:  Due to the COVID-19 pandemic and acuity of the unit, this group has been supplemented with worksheets.  Therapeutic Modalities:  Cognitive Behavioral Therapy Solution Focused Therapy Motivational Interviewing 

## 2019-04-02 NOTE — Progress Notes (Signed)
[facility] MD Progress Note  04/02/2019 11:47 AM KARTHIKEYA FUNKE  MRN:  [id number] Subjective:    [name] is a 49 year old patient followed by [facility] ([facility]) with a known history of a schizoaffective disorder.  He only takes long-acting injectable paliperidone and always refuses/resists oral medications.  His case is also complicated by the fact that he her intermittently abuses compounds but never to the extent rising to the threshold needing detox Principal Problem:  Diagnosis: Active Problems:   Substance induced mood disorder ([place])  Total Time spent with patient: 20 minutes  Past Psychiatric History: See eval  Past Medical History:  Past Medical History:  Diagnosis Date  . Anxiety   . Bipolar 1 disorder ([place])   . Stroke [facility])     Past Surgical History:  Procedure Laterality Date  . CHOLECYSTECTOMY N/A 12/24/2012   Procedure: LAPAROSCOPIC CHOLECYSTECTOMY;  Surgeon: Gwenyth Ober, MD;  [facility]: [facility];  Service: General;  Laterality: N/A;  . FINGER SURGERY     Family History:  Family History  Problem Relation Age of Onset  . Heart disease Mother    Family Psychiatric  History: See eval Social History:  Social History   Substance and Sexual Activity  Alcohol Use Yes  . Alcohol/week: 20.0 standard drinks  . Types: 20 Cans of beer per week   Comment: BAC was clear     Social History   Substance and Sexual Activity  Drug Use No  . Types: Marijuana   Comment: UDS was clear    Social History   Socioeconomic History  . Marital status: Single    Spouse name: Not on file  . Number of children: Not on file  . Years of education: Not on file  . Highest education level: Not on file  Occupational History  . Not on file  Tobacco Use  . Smoking status: Current Every Day Smoker    Packs/day: 1.00    Years: 5.00    Pack years: 5.00    Types: Cigarettes  . Smokeless tobacco: Never Used  Substance and Sexual Activity  . Alcohol use: Yes    Alcohol/week: 20.0  standard drinks    Types: 20 Cans of beer per week    Comment: BAC was clear  . Drug use: No    Types: Marijuana    Comment: UDS was clear  . Sexual activity: Never    Birth control/protection: Condom  Other Topics Concern  . Not on file  Social History Narrative  . Not on file   Social Determinants of Health   Financial Resource Strain:   . Difficulty of Paying Living Expenses: Not on file  Food Insecurity:   . Worried About Charity fundraiser in the Last Year: Not on file  . Ran Out of Food in the Last Year: Not on file  Transportation Needs:   . Lack of Transportation (Medical): Not on file  . Lack of Transportation (Non-Medical): Not on file  Physical Activity:   . Days of Exercise per Week: Not on file  . Minutes of Exercise per Session: Not on file  Stress:   . Feeling of Stress : Not on file  Social Connections:   . Frequency of Communication with Friends and Family: Not on file  . Frequency of Social Gatherings with Friends and Family: Not on file  . Attends Religious Services: Not on file  . Active Member of Clubs or Organizations: Not on file  . Attends Archivist Meetings:  Not on file  . Marital Status: Not on file   Additional Social History:                         Sleep: Fair  Appetite:  Fair  Current Medications: Current Facility-Administered Medications  Medication Dose Route Frequency Provider Last Rate Last Admin  . acetaminophen (TYLENOL) tablet 650 mg  650 mg Oral Q6H PRN Jackelyn Poling, NP      . alum & mag hydroxide-simeth (MAALOX/MYLANTA) 200-200-20 MG/5ML suspension 30 mL  30 mL Oral Q4H PRN Nira Conn A, NP      . feeding supplement (ENSURE ENLIVE) (ENSURE ENLIVE) liquid 237 mL  237 mL Oral BID BM Cobos, Rockey Situ, MD   237 mL at 03/31/19 1520  . hydrOXYzine (ATARAX/VISTARIL) tablet 25 mg  25 mg Oral Q6H PRN Cobos, Rockey Situ, MD      . LORazepam (ATIVAN) tablet 1 mg  1 mg Oral Q6H PRN Cobos, Rockey Situ, MD      .  magnesium hydroxide (MILK OF MAGNESIA) suspension 30 mL  30 mL Oral Daily PRN Nira Conn A, NP      . multivitamin with minerals tablet 1 tablet  1 tablet Oral Daily Cobos, Rockey Situ, MD   1 tablet at 04/02/19 [phone]  . risperiDONE (RISPERDAL) tablet 1 mg  1 mg Oral BH-q7a Cobos, Fernando A, MD      . risperiDONE (RISPERDAL) tablet 2 mg  2 mg Oral QHS Cobos, Fernando A, MD      . thiamine tablet 100 mg  100 mg Oral Daily Cobos, Rockey Situ, MD   100 mg at 04/02/19 0852  . traZODone (DESYREL) tablet 50 mg  50 mg Oral QHS PRN Jackelyn Poling, NP        Lab Results:  Results for orders placed or performed during the hospital encounter of 03/31/19 (from the past 48 hour(s))  TSH     Status: None   Collection Time: 04/02/19  6:17 AM  Result Value Ref Range   TSH 1.048 0.350 - 4.500 uIU/mL    Comment: Performed by a 3rd Generation assay with a functional sensitivity of <=0.01 uIU/mL. Performed at [facility], [address] [place], [place] [address]   Lipid panel     Status: Abnormal   Collection Time: 04/02/19  6:17 AM  Result Value Ref Range   Cholesterol 165 0 - 200 mg/dL   Triglycerides 967 (H) <150 mg/dL   HDL 56 >89 mg/dL   Total CHOL/HDL Ratio 2.9 RATIO   VLDL 41 (H) 0 - 40 mg/dL   LDL Cholesterol 68 0 - 99 mg/dL    Comment:        Total Cholesterol/HDL:CHD Risk Coronary Heart Disease Risk Table                     Men   Women  1/2 Average Risk   3.4   3.3  Average Risk       5.0   4.4  2 X Average Risk   9.6   7.1  3 X Average Risk  23.4   11.0        Use the calculated Patient Ratio above and the CHD Risk Table to determine the patient's CHD Risk.        ATP III CLASSIFICATION (LDL):  <100     mg/dL   Optimal  381-017  mg/dL   Near  or Above                    Optimal  130-159  mg/dL   Borderline  161-096  mg/dL   High  >045     mg/dL   Very High Performed at [facility], [address], [place], [place] [address]     Blood  Alcohol level:  Lab Results  Component Value Date   ETH <10 03/30/2019   ETH <10 06/19/2017    Metabolic Disorder Labs: No results found for: HGBA1C, MPG No results found for: PROLACTIN Lab Results  Component Value Date   CHOL 165 04/02/2019   TRIG 203 (H) 04/02/2019   HDL 56 04/02/2019   CHOLHDL 2.9 04/02/2019   VLDL 41 (H) 04/02/2019   LDLCALC 68 04/02/2019    Physical Findings: AIMS: Facial and Oral Movements Muscles of Facial Expression: None, normal Lips and Perioral Area: None, normal Jaw: None, normal Tongue: None, normal,Extremity Movements Upper (arms, wrists, hands, fingers): None, normal Lower (legs, knees, ankles, toes): None, normal, Trunk Movements Neck, shoulders, hips: None, normal, Overall Severity Severity of abnormal movements (highest score from questions above): None, normal Incapacitation due to abnormal movements: None, normal Patient's awareness of abnormal movements (rate only patient's report): No Awareness, Dental Status Current problems with teeth and/or dentures?: No Does patient usually wear dentures?: No  CIWA:  CIWA-Ar Total: 1 COWS:     Musculoskeletal: Strength & Muscle Tone: within normal limits Gait & Station: normal Patient leans: N/A  Psychiatric Specialty Exam: Physical Exam  Review of Systems  Blood pressure 117/74, pulse 83, temperature 98.2 F (36.8 C), temperature source Oral, resp. rate 16, height 5\' 9"  (1.753 m), weight 72.1 kg, SpO2 99 %.Body mass index is 23.48 kg/m.  General Appearance: Casual  Eye Contact:  Good  Speech:  Clear and Coherent  Volume:  Normal  Mood:  Euthymic  Affect:  Constricted  Thought Process:  Goal Directed  Orientation:  Full (Time, Place, and Person)  Thought Content:  Illogical  Suicidal Thoughts:  No  Homicidal Thoughts:  No  Memory:  Immediate;   Fair Recent;   Fair Remote;   Fair  Judgement:  Poor  Insight:  Shallow  Psychomotor Activity:  Normal  Concentration:  Concentration:  Fair and Attention Span: Fair  Recall:  of Knowledge:  Fair  Language:  Fair  Akathisia:  Negative  Handed:  Right  AIMS (if indicated):     Assets:  Social Support Talents/Skills  ADL's:  Intact  Cognition:  WNL  Sleep:  Number of Hours: 5.25  The reason he is noted to be illogical and thought content is because he continues to tell the same story that he cut his hand to see "if his blood was red" which of course is almost delusional but I think he was under the influence of the Adderall he took from a friend.  He will often take Adderall or opiates from friends but again his substance abuse never rises to the threshold of detox and rehab though his mother has requested this multiple times   Treatment Plan Summary: Daily contact with patient to assess and evaluate symptoms and progress in treatment and Medication management Monitor another 24 hours discussed with act team probable discharge tomorrow [employer], MD 04/02/2019, 11:47 AM

## 2019-04-02 NOTE — [facility] Group Notes (Signed)
Pt did not attend wrap up group this evening. Pt was in their room.  
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
## 2019-04-02 NOTE — Progress Notes (Signed)
NUTRITION ASSESSMENT  Pt identified as at risk on the Malnutrition Screen Tool  INTERVENTION: 1. Supplements: Ensure Enlive po BID, each supplement provides 350 kcal and 20 grams of protein  NUTRITION DIAGNOSIS: Unintentional weight loss related to sub-optimal intake as evidenced by pt report.   Goal: Pt to meet >/= 90% of their estimated nutrition needs.  Monitor:  PO intake  Assessment:  Pt admitted with substance abuse, pt reported drinking ~80 oz of beer daily PTA. UDS+ for amphetamines as well. Pt has been ordered Ensure supplements. No weight loss per weight records.  Height: Ht Readings from Last 1 Encounters:  03/31/19 5\' 9"  (1.753 m)    Weight: Wt Readings from Last 1 Encounters:  03/31/19 72.1 kg    Weight Hx: Wt Readings from Last 10 Encounters:  03/31/19 72.1 kg  06/19/17 81.6 kg  04/09/17 72.6 kg  04/07/14 78.5 kg  08/29/13 74.4 kg  01/30/13 75.8 kg  12/25/12 72.6 kg  12/02/12 72.6 kg  02/10/11 69.9 kg    BMI:  Body mass index is 23.48 kg/m. Pt meets criteria for normal based on current BMI.  Estimated Nutritional Needs: Kcal: 25-30 kcal/kg Protein: > 1 gram protein/kg Fluid: 1 ml/kcal  Diet Order:  Diet Order            Diet regular Room service appropriate? Yes; Fluid consistency: Thin  Diet effective now             Pt is also offered choice of unit snacks mid-morning and mid-afternoon.  Pt is eating as desired.   Lab results and medications reviewed.   02/12/11, MS, RD, LDN Inpatient Clinical Dietitian Contact information available via Amion

## 2019-04-02 NOTE — Progress Notes (Signed)
Spiritual care group on grief and loss facilitated by chaplain Arria Naim  Group Goal:  Support / Education around grief and loss Members engage in facilitated group support and psycho-social education.  Group Description:  Following introductions and group rules, group members engaged in facilitated group dialog and support around topic of loss, with particular support around experiences of loss in their lives. Group Identified types of loss (relationships / self / things) and identified patterns, circumstances, and changes that precipitate losses. Reflected on thoughts / feelings around loss, normalized grief responses, and recognized variety in grief experience. Patient Progress: Invited to group.  Did not attend  

## 2019-04-02 NOTE — Progress Notes (Signed)
Recreation Therapy Notes  Date:  2.8.21 Time: 0930 Location: [address] Dayroom  Group Topic: Stress Management  Goal Area(s) Addresses:  Patient will identify positive stress management techniques. Patient will identify benefits of using stress management post d/c.  Intervention: Stress Management  Activity :  Meditation.  LRT played a meditation that focused on making the most of your day and making every moment count.  Patients were to listen and follow along as meditation played to engage in activity.  Education:  Stress Management, Discharge Planning.   Education Outcome: Acknowledges Education  Clinical Observations/Feedback:  Pt did not attend group activity.    Kathlyn Leachman, LRT/CTRS         Drexler Maland A 04/02/2019 10:48 AM 

## 2019-04-03 LAB — HEMOGLOBIN A1C
Hgb A1c MFr Bld: 5.1 % (ref 4.8–5.6)
Mean Plasma Glucose: 100 mg/dL

## 2019-04-03 MED ORDER — TRAZODONE HCL 150 MG PO TABS: 150.0000 mg | ORAL_TABLET | Freq: Every evening | ORAL | 2 refills | Status: DC | PRN

## 2019-04-03 NOTE — Progress Notes (Signed)
Patient denied SI and HI, contracts for safety.  Denied A/V hallucinations.  Denied pain. Medications administered per MD orders.  Emotional support and encouragement given patient. Safety maintained with 15 minute checks.   

## 2019-04-03 NOTE — BHH Suicide Risk Assessment (Signed)
[facility] Discharge Suicide Risk Assessment   Principal Problem: Schizoaffective/substance abuse Discharge Diagnoses: Active Problems:   Substance induced mood disorder (HCC)   Total Time spent with patient: 45 minutes Musculoskeletal: Strength & Muscle Tone: within normal limits Gait & Station: normal Patient leans: N/A  Psychiatric Specialty Exam: Physical Exam  Review of Systems  Blood pressure 110/83, pulse 81, temperature 97.7 F (36.5 C), resp. rate 16, height 5\' 9"  (1.753 m), weight 72.1 kg, SpO2 99 %.Body mass index is 23.48 kg/m.  General Appearance: Casual  Eye Contact:  Good  Speech:  Clear and Coherent  Volume:  Normal  Mood:  Euthymic  Affect:  Appropriate  Thought Process:  Coherent and Goal Directed  Orientation:  Full (Time, Place, and Person)  Thought Content:  Illogical and Tangential  Suicidal Thoughts:  No  Homicidal Thoughts:  No  Memory:  Immediate;   Good Recent;   Good Remote;   Good  Judgement:  Fair  Insight:  Fair  Psychomotor Activity:  Normal  Concentration:  Concentration: Fair and Attention Span: Fair  Recall:  of Knowledge:  Fair  Language:  Fair  Akathisia:  Negative  Handed:  Right  AIMS (if indicated):     Assets:  Communication Skills Desire for Improvement  ADL's:  Intact  Cognition:  WNL  Sleep:  Number of Hours: 6.75     Mental Status Per Nursing Assessment::   On Admission:  Self-harm behaviors  Demographic Factors:  Male, Caucasian and Unemployed  Loss Factors: Decrease in vocational status  Historical Factors: NA  Risk Reduction Factors:   Sense of responsibility to family and Religious beliefs about death  Continued Clinical Symptoms:  Previous Psychiatric Diagnoses and Treatments  Cognitive Features That Contribute To Risk:  None    Suicide Risk:  Minimal: No identifiable suicidal ideation.  Patients presenting with no risk factors but with morbid ruminations; may be classified as minimal risk  based on the severity of the depressive symptoms  Follow-up Information    [facility] Follow up.   Contact information: [address] [place] [phone]           Plan Of Care/Follow-up recommendations:  Activity:  full  Gabriellia Rempel, MD 04/03/2019, 7:58 AM

## 2019-04-03 NOTE — Progress Notes (Signed)
  [facility] Adult Case Management Discharge Plan :  Will you be returning to the same living situation after discharge:  Yes,  patient is returning home with mother and brother At discharge, do you have transportation home?: Yes,  [employer]/ [employer]  Do you have the ability to pay for your medications: Yes,  Medicaid  Release of information consent forms completed and in the chart;  Patient's signature needed at discharge.  Patient to Follow up at: Follow-up Information    [facility] Call.   Why: ACTT services will continue once you have discharged from the hospital. Please be sure to contact your team within 2-3 days of discharge. For any additional questions, please call office.  Contact information: [address] [place] [address] [phone]           Next level of care provider has access to [facility] Link:yes  Safety Planning and Suicide Prevention discussed: Yes,  with the patient     Has patient been referred to the Quitline?: N/A patient is not a smoker  Patient has been referred for addiction treatment: Pt. refused referral  Maeola Sarah, LCSWA 04/03/2019, 10:12 AM

## 2019-04-03 NOTE — Progress Notes (Signed)
Discharge Note:  Patient discharged.  Suicide prevention information given and discussed with patient who stated he understood and had no questions.  Patient stated he received all his belongings, clothing, toiletries, misc items, etc.  Patient stated he appreciated all assistance received from [facility] staff.

## 2019-04-03 NOTE — Discharge Summary (Signed)
Physician Discharge Summary Note  Patient:  [name] is an 49 y.o., male MRN:  [id number] DOB:  [birth year] Patient phone:  [phone] (home)  Patient address:   [address] [place] [facility] [address],  Total Time spent with patient: 45 minutes  Date of Admission:  03/31/2019 Date of Discharge: 04/03/2019  Reason for Admission:    [name] is a 49 year old single individual who lives in [place] with his mother, he required petition for involuntary commitment.  He was noted by his mother to be making superficial cuts on his palm, who contacted law enforcement for petition for involuntary commitment.  His drug screen showed amphetamines his alcohol level was negligible.  The patient elaborated that he "wanted to see if his blood was red" but he was under the influence of Adderall which she had taken from a friend.  Though his chief complaint was certainly strange, he consistently reported it from examiner to examiner.  Principal Problem: Chronic schizoaffective disorder complicated by chronic low-grade substance abuse involving cannabis intermittently, amphetamines intermittently, opiates intermittently, and alcohol usage about every 1 to 3 days.  None of these abuses have risen to the level of inpatient detox thus far as best we can tell. Discharge Diagnoses: Active Problems:   Substance induced mood disorder (HCC)   Past Psychiatric History: History of substance-induced psychosis/rule out borderline intellectual functioning  Past Medical History:  Past Medical History:  Diagnosis Date  . Anxiety   . Bipolar 1 disorder (HCC)   . Stroke [facility])     Past Surgical History:  Procedure Laterality Date  . CHOLECYSTECTOMY N/A 12/24/2012   Procedure: LAPAROSCOPIC CHOLECYSTECTOMY;  Surgeon: Cherylynn Ridges, MD;  Location: [facility] OR;  Service: General;  Laterality: N/A;  . FINGER SURGERY     Family History:  Family History  Problem Relation Age of Onset  . Heart disease Mother    Family  Psychiatric  History: Shares no new data Social History:  Social History   Substance and Sexual Activity  Alcohol Use Yes  . Alcohol/week: 20.0 standard drinks  . Types: 20 Cans of beer per week   Comment: BAC was clear     Social History   Substance and Sexual Activity  Drug Use No  . Types: Marijuana   Comment: UDS was clear    Social History   Socioeconomic History  . Marital status: Single    Spouse name: Not on file  . Number of children: Not on file  . Years of education: Not on file  . Highest education level: Not on file  Occupational History  . Not on file  Tobacco Use  . Smoking status: Current Every Day Smoker    Packs/day: 1.00    Years: 5.00    Pack years: 5.00    Types: Cigarettes  . Smokeless tobacco: Never Used  Substance and Sexual Activity  . Alcohol use: Yes    Alcohol/week: 20.0 standard drinks    Types: 20 Cans of beer per week    Comment: BAC was clear  . Drug use: No    Types: Marijuana    Comment: UDS was clear  . Sexual activity: Never    Birth control/protection: Condom  Other Topics Concern  . Not on file  Social History Narrative  . Not on file   Social Determinants of Health   Financial Resource Strain:   . Difficulty of Paying Living Expenses: Not on file  Food Insecurity:   . Worried About [employer] of  Food in the Last Year: Not on file  . Ran Out of Food in the Last Year: Not on file  Transportation Needs:   . Lack of Transportation (Medical): Not on file  . Lack of Transportation (Non-Medical): Not on file  Physical Activity:   . Days of Exercise per Week: Not on file  . Minutes of Exercise per Session: Not on file  Stress:   . Feeling of Stress : Not on file  Social Connections:   . Frequency of Communication with Friends and Family: Not on file  . Frequency of Social Gatherings with Friends and Family: Not on file  . Attends Religious Services: Not on file  . Active Member of Clubs or Organizations: Not on file   . Attends Archivist Meetings: Not on file  . Marital Status: Not on file    Hospital Course:    Patient was admitted under routine precautions and displayed no dangerous behaviors while here, though his chief complaint was certainly unusual, cutting himself to see "if his blood was read, as discussed he stated this repeatedly from examiner to examiner with general indifference to the unusual nature of this chief complaint.  At any rate he required no detox measures.  He reported no auditory or visual hallucinations and always lobbied for discharge.  By the date of the ninth he was certainly baseline alert oriented cooperative had received his long-acting injectable from his act team and was clear for release.  Told to abstain from illicit and other drugs given to him by friends so forth.  Physical Findings: AIMS: Facial and Oral Movements Muscles of Facial Expression: None, normal Lips and Perioral Area: None, normal Jaw: None, normal Tongue: None, normal,Extremity Movements Upper (arms, wrists, hands, fingers): None, normal Lower (legs, knees, ankles, toes): None, normal, Trunk Movements Neck, shoulders, hips: None, normal, Overall Severity Severity of abnormal movements (highest score from questions above): None, normal Incapacitation due to abnormal movements: None, normal Patient's awareness of abnormal movements (rate only patient's report): No Awareness, Dental Status Current problems with teeth and/or dentures?: No Does patient usually wear dentures?: No  CIWA:  CIWA-Ar Total: 2 COWS:     Musculoskeletal: Strength & Muscle Tone: within normal limits Gait & Station: normal Patient leans: N/A  Psychiatric Specialty Exam: Physical Exam  Review of Systems  Blood pressure 110/83, pulse 81, temperature 97.7 F (36.5 C), resp. rate 16, height 5\' 9"  (1.753 m), weight 72.1 kg, SpO2 99 %.Body mass index is 23.48 kg/m.  General Appearance: Casual  Eye Contact:  Good   Speech:  Clear and Coherent  Volume:  Normal  Mood:  Euthymic  Affect:  Appropriate  Thought Process:  Coherent and Goal Directed  Orientation:  Full (Time, Place, and Person)  Thought Content:  Illogical and Tangential  Suicidal Thoughts:  No  Homicidal Thoughts:  No  Memory:  Immediate;   Good Recent;   Good Remote;   Good  Judgement:  Fair  Insight:  Fair  Psychomotor Activity:  Normal  Concentration:  Concentration: Fair and Attention Span: Fair  Recall:  [employer] of Knowledge:  Fair  Language:  Fair  Akathisia:  Negative  Handed:  Right  AIMS (if indicated):     Assets:  Communication Skills Desire for Improvement  ADL's:  Intact  Cognition:  WNL  Sleep:  Number of Hours: 6.75        Has this patient used any form of tobacco in the last 30 days? (Cigarettes,  Smokeless Tobacco, Cigars, and/or Pipes) Yes, No  Blood Alcohol level:  Lab Results  Component Value Date   ETH <10 03/30/2019   ETH <10 06/19/2017    Metabolic Disorder Labs:  Lab Results  Component Value Date   HGBA1C 5.1 04/02/2019   MPG 100 04/02/2019   No results found for: PROLACTIN Lab Results  Component Value Date   CHOL 165 04/02/2019   TRIG 203 (H) 04/02/2019   HDL 56 04/02/2019   CHOLHDL 2.9 04/02/2019   VLDL 41 (H) 04/02/2019   LDLCALC 68 04/02/2019    See Psychiatric Specialty Exam and Suicide Risk Assessment completed by Attending Physician prior to discharge.  Discharge destination:  Home  Is patient on multiple antipsychotic therapies at discharge:  No   Has Patient had three or more failed trials of antipsychotic monotherapy by history:  No  Recommended Plan for Multiple Antipsychotic Therapies: NA   Allergies as of 04/03/2019   No Known Allergies     Medication List    STOP taking these medications   carbamazepine 200 MG 12 hr tablet Commonly known as: TEGRETOL XR   risperiDONE 1 MG tablet Commonly known as: RISPERDAL     TAKE these medications      Indication  traZODone 150 MG tablet Commonly known as: DESYREL Take 1 tablet (150 mg total) by mouth at bedtime as needed for sleep.  Indication: Trouble Sleeping      Follow-up Information    [facility] Follow up.   Contact information: [address] [place] [address] [place] [address] [phone]           Signed: Malvin Johns, MD 04/03/2019, 7:53 AM

## 2019-10-17 ENCOUNTER — Emergency Department (HOSPITAL_COMMUNITY)
Admission: EM | Admit: 2019-10-17 | Discharge: 2019-10-18 | Disposition: A | Discharge status: Home / Self Care | Payer: Medicaid Other | Attending: Emergency Medicine | Admitting: Emergency Medicine

## 2019-10-17 ENCOUNTER — Other Ambulatory Visit: Payer: Self-pay

## 2019-10-17 ENCOUNTER — Encounter (HOSPITAL_COMMUNITY): Payer: Self-pay | Admitting: *Deleted

## 2019-10-17 DIAGNOSIS — F419 Anxiety disorder, unspecified: Secondary | ICD-10-CM | POA: Insufficient documentation

## 2019-10-17 DIAGNOSIS — F1510 Other stimulant abuse, uncomplicated: Secondary | ICD-10-CM | POA: Insufficient documentation

## 2019-10-17 DIAGNOSIS — F1094 Alcohol use, unspecified with alcohol-induced mood disorder: Secondary | ICD-10-CM | POA: Diagnosis not present

## 2019-10-17 DIAGNOSIS — F17210 Nicotine dependence, cigarettes, uncomplicated: Secondary | ICD-10-CM | POA: Diagnosis not present

## 2019-10-17 DIAGNOSIS — F1910 Other psychoactive substance abuse, uncomplicated: Secondary | ICD-10-CM

## 2019-10-17 DIAGNOSIS — R45851 Suicidal ideations: Secondary | ICD-10-CM

## 2019-10-17 DIAGNOSIS — F250 Schizoaffective disorder, bipolar type: Secondary | ICD-10-CM | POA: Diagnosis present

## 2019-10-17 DIAGNOSIS — Z20822 Contact with and (suspected) exposure to covid-19: Secondary | ICD-10-CM | POA: Diagnosis not present

## 2019-10-17 DIAGNOSIS — R45850 Homicidal ideations: Secondary | ICD-10-CM | POA: Insufficient documentation

## 2019-10-17 DIAGNOSIS — F209 Schizophrenia, unspecified: Secondary | ICD-10-CM | POA: Insufficient documentation

## 2019-10-17 LAB — COMPREHENSIVE METABOLIC PANEL
ALT: 23 U/L (ref 0–44)
AST: 18 U/L (ref 15–41)
Albumin: 4.2 g/dL (ref 3.5–5.0)
Alkaline Phosphatase: 64 U/L (ref 38–126)
Anion gap: 14 (ref 5–15)
BUN: 8 mg/dL (ref 6–20)
CO2: 23 mmol/L (ref 22–32)
Calcium: 9.1 mg/dL (ref 8.9–10.3)
Chloride: 101 mmol/L (ref 98–111)
Creatinine, Ser: 0.96 mg/dL (ref 0.61–1.24)
GFR calc Af Amer: >60 mL/min (ref >60)
GFR calc non Af Amer: >60 mL/min (ref >60)
Glucose, Bld: 106 mg/dL — ABNORMAL HIGH (ref 70–99)
Potassium: 3.2 mmol/L — ABNORMAL LOW (ref 3.5–5.1)
Sodium: 138 mmol/L (ref 135–145)
Total Bilirubin: 1.2 mg/dL (ref 0.3–1.2)
Total Protein: 7.5 g/dL (ref 6.5–8.1)

## 2019-10-17 LAB — CBC
HCT: 40.7 % (ref 39.0–52.0)
Hemoglobin: 14.1 g/dL (ref 13.0–17.0)
MCH: 32.6 pg (ref 26.0–34.0)
MCHC: 34.6 g/dL (ref 30.0–36.0)
MCV: 94.2 fL (ref 80.0–100.0)
Platelets: 197 10*3/uL (ref 150–400)
RBC: 4.32 MIL/uL (ref 4.22–5.81)
RDW: 11.9 % (ref 11.5–15.5)
WBC: 7.9 10*3/uL (ref 4.0–10.5)
nRBC: 0 % (ref 0.0–0.2)

## 2019-10-17 LAB — ACETAMINOPHEN LEVEL: Acetaminophen (Tylenol), Serum: <10 ug/mL — ABNORMAL LOW (ref 10–30)

## 2019-10-17 LAB — SALICYLATE LEVEL: Salicylate Lvl: <7 mg/dL — ABNORMAL LOW (ref 7.0–30.0)

## 2019-10-17 LAB — ETHANOL: Alcohol, Ethyl (B): 85 mg/dL — ABNORMAL HIGH (ref <10)

## 2019-10-17 LAB — RAPID URINE DRUG SCREEN, HOSP PERFORMED
Amphetamines: NOT DETECTED
Barbiturates: NOT DETECTED
Benzodiazepines: NOT DETECTED
Cocaine: NOT DETECTED
Opiates: NOT DETECTED
Tetrahydrocannabinol: NOT DETECTED

## 2019-10-17 LAB — SARS CORONAVIRUS 2 BY RT PCR (HOSPITAL ORDER, PERFORMED IN ~~LOC~~ HOSPITAL LAB): SARS Coronavirus 2: NEGATIVE

## 2019-10-17 MED ORDER — ALUM & MAG HYDROXIDE-SIMETH 200-200-20 MG/5ML PO SUSP: 30.0000 mL | Freq: Four times a day (QID) | ORAL | Status: DC | PRN

## 2019-10-17 MED ORDER — LORAZEPAM 1 MG PO TABS: 0.0000 mg | ORAL_TABLET | Freq: Two times a day (BID) | ORAL | Status: DC

## 2019-10-17 MED ORDER — ACETAMINOPHEN 325 MG PO TABS: 650.0000 mg | ORAL_TABLET | ORAL | Status: DC | PRN

## 2019-10-17 MED ORDER — ZOLPIDEM TARTRATE 5 MG PO TABS: 5.0000 mg | ORAL_TABLET | Freq: Every evening | ORAL | Status: DC | PRN

## 2019-10-17 MED ORDER — POTASSIUM CHLORIDE CRYS ER 20 MEQ PO TBCR
40.0000 meq | EXTENDED_RELEASE_TABLET | Freq: Once | ORAL | Status: AC
  Administered 2019-10-18: 40 meq via ORAL
  Filled 2019-10-17: qty 2

## 2019-10-17 MED ORDER — ONDANSETRON HCL 4 MG PO TABS: 4.0000 mg | ORAL_TABLET | Freq: Three times a day (TID) | ORAL | Status: DC | PRN

## 2019-10-17 MED ORDER — THIAMINE HCL 100 MG PO TABS
100.0000 mg | ORAL_TABLET | Freq: Every day | ORAL | Status: DC
  Administered 2019-10-18: 100 mg via ORAL
  Filled 2019-10-17: qty 1

## 2019-10-17 MED ORDER — THIAMINE HCL 100 MG/ML IJ SOLN: 100.0000 mg | Freq: Every day | INTRAMUSCULAR | Status: DC

## 2019-10-17 MED ORDER — NICOTINE 21 MG/24HR TD PT24: 21.0000 mg | MEDICATED_PATCH | Freq: Every day | TRANSDERMAL | Status: DC

## 2019-10-17 MED ORDER — LORAZEPAM 2 MG/ML IJ SOLN: 0.0000 mg | Freq: Four times a day (QID) | INTRAMUSCULAR | Status: DC

## 2019-10-17 MED ORDER — LORAZEPAM 2 MG/ML IJ SOLN: 0.0000 mg | Freq: Two times a day (BID) | INTRAMUSCULAR | Status: DC

## 2019-10-17 MED ORDER — LORAZEPAM 1 MG PO TABS: 0.0000 mg | ORAL_TABLET | Freq: Four times a day (QID) | ORAL | Status: DC
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
## 2019-10-17 NOTE — ED Provider Notes (Signed)
   Date: 10/17/19  Rate: 70  Rhythm: normal sinus rhythm  QRS Axis: normal  PR and QT Intervals: PR prolonged  ST/T Wave abnormalities: nonspecific T wave changes  PR and QRS Conduction Disutrbances:none     Mancel Bale, MD 10/17/19 2252

## 2019-10-17 NOTE — ED Provider Notes (Signed)
[facility]   CSN: [id number] Arrival date & time: 10/17/19  1853     History Chief Complaint  Patient presents with  . Suicidal  . Homicidal    [name] is a 49 y.o. male.  The history is provided by the patient and medical records. No language interpreter was used.     49 year old male significant history of bipolar, anxiety, polysubstance abuse, brought here accompanied by [facility] for evaluation of SI and HI.  Patient report he has been hearing voices with voices telling him to harm people.  Today he thought about stabbing someone random.  He also having thought about harming himself without any specific plan.  He admits to using alcohol last use was today, as well as taking pain pills.  He report been compliant with his psychiatric medication, with no recent medication changes.  He is able to sleep and eat as normal.  He does endorse headache and body aches but states that this is chronic.  He has not been vaccinated for Covid.  He did reach out to [facility] to bring him here and seek of help for his hallucination and his SI and HI.  Past Medical History:  Diagnosis Date  . Anxiety   . Bipolar 1 disorder (HCC)   . Stroke [facility])     Patient Active Problem List   Diagnosis Date Noted  . Substance induced mood disorder (HCC) 03/31/2019  . Adjustment disorder with anxious mood 06/19/2017  . Cocaine abuse with cocaine-induced mood disorder (HCC) 04/08/2014  . Alcohol abuse, episodic 04/07/2014  . Alcohol dependence with alcohol-induced mood disorder (HCC)   . Bipolar I disorder, most recent episode mixed (HCC) 08/31/2013  . Anxiety state, unspecified 08/31/2013  . Bipolar disorder (HCC) 08/30/2013  . Alcohol-induced mood disorder (HCC) 08/29/2013  . Diarrhea 01/30/2013  . Postop check 01/30/2013  . Abdominal pain 12/18/2012  . Family history of cardiac disorder 09/20/2012  . Excessive sweating, local 09/20/2012  . Compulsive  tobacco user syndrome 09/20/2012    Past Surgical History:  Procedure Laterality Date  . CHOLECYSTECTOMY N/A 12/24/2012   Procedure: LAPAROSCOPIC CHOLECYSTECTOMY;  Surgeon: Cherylynn Ridges, MD;  Location: [facility] OR;  Service: General;  Laterality: N/A;  . FINGER SURGERY         Family History  Problem Relation Age of Onset  . Heart disease Mother     Social History   Tobacco Use  . Smoking status: Current Every Day Smoker    Packs/day: 1.00    Years: 5.00    Pack years: 5.00    Types: Cigarettes  . Smokeless tobacco: Never Used  Substance Use Topics  . Alcohol use: Yes    Alcohol/week: 20.0 standard drinks    Types: 20 Cans of beer per week    Comment: BAC was clear  . Drug use: No    Types: Marijuana    Comment: UDS was clear    Home Medications Prior to Admission medications   Medication Sig Start Date End Date Taking? Authorizing Provider  traZODone (DESYREL) 150 MG tablet Take 1 tablet (150 mg total) by mouth at bedtime as needed for sleep. 04/03/19   Malvin Johns, MD    Allergies    Patient has no known allergies.  Review of Systems   Review of Systems  All other systems reviewed and are negative.   Physical Exam Updated Vital Signs BP 100/70 (BP Location: Left Arm)   Pulse 89   Temp  98.8 F (37.1 C) (Oral)   Resp 14   Ht 5\' 9"  (1.753 m)   Wt 72.6 kg   SpO2 94%   BMI 23.63 kg/m   Physical Exam Vitals and nursing note reviewed.  Constitutional:      General: He is not in acute distress.    Appearance: He is well-developed.  HENT:     Head: Atraumatic.  Eyes:     Extraocular Movements: Extraocular movements intact.     Conjunctiva/sclera: Conjunctivae normal.     Pupils: Pupils are equal, round, and reactive to light.  Cardiovascular:     Rate and Rhythm: Normal rate and regular rhythm.     Pulses: Normal pulses.  Pulmonary:     Effort: Pulmonary effort is normal.     Breath sounds: Normal breath sounds.  Abdominal:     Palpations: Abdomen is  soft.     Tenderness: There is no abdominal tenderness.  Musculoskeletal:     Cervical back: Neck supple.  Skin:    Findings: No rash.  Neurological:     Mental Status: He is alert. Mental status is at baseline.     GCS: GCS eye subscore is 4. GCS verbal subscore is 5. GCS motor subscore is 6.  Psychiatric:        Attention and Perception: Attention normal.        Mood and Affect: Mood normal.        Speech: Speech normal.        Behavior: Behavior is cooperative.        Thought Content: Thought content is not paranoid. Thought content includes homicidal and suicidal ideation.     Comments: Patient is calm and cooperative.     ED Results / Procedures / Treatments   Labs (all labs ordered are listed, but only abnormal results are displayed) Labs Reviewed  COMPREHENSIVE METABOLIC PANEL - Abnormal; Notable for the following components:      Result Value   Potassium 3.2 (*)    Glucose, Bld 106 (*)    All other components within normal limits  ETHANOL - Abnormal; Notable for the following components:   Alcohol, Ethyl (B) 85 (*)    All other components within normal limits  SALICYLATE LEVEL - Abnormal; Notable for the following components:   Salicylate Lvl <7.0 (*)    All other components within normal limits  ACETAMINOPHEN LEVEL - Abnormal; Notable for the following components:   Acetaminophen (Tylenol), Serum <10 (*)    All other components within normal limits  SARS CORONAVIRUS 2 BY RT PCR (HOSPITAL ORDER, PERFORMED IN [place] [facility])  CBC  RAPID URINE DRUG SCREEN, HOSP PERFORMED    EKG None  Radiology No results found.  Procedures Procedures (including critical care time)  Medications Ordered in ED Medications  LORazepam (ATIVAN) injection 0-4 mg (has no administration in time range)    Or  LORazepam (ATIVAN) tablet 0-4 mg (has no administration in time range)  LORazepam (ATIVAN) injection 0-4 mg (has no administration in time range)    Or  LORazepam  (ATIVAN) tablet 0-4 mg (has no administration in time range)  thiamine tablet 100 mg (has no administration in time range)    Or  thiamine (B-1) injection 100 mg (has no administration in time range)  acetaminophen (TYLENOL) tablet 650 mg (has no administration in time range)  zolpidem (AMBIEN) tablet 5 mg (has no administration in time range)  ondansetron (ZOFRAN) tablet 4 mg (has no administration in time  range)  alum & mag hydroxide-simeth (MAALOX/MYLANTA) 200-200-20 MG/5ML suspension 30 mL (has no administration in time range)  nicotine (NICODERM CQ - dosed in mg/24 hours) patch 21 mg (has no administration in time range)  potassium chloride SA (KLOR-CON) CR tablet 40 mEq (has no administration in time range)    ED Course  I have reviewed the triage vital signs and the nursing notes.  Pertinent labs & imaging results that were available during my care of the patient were reviewed by me and considered in my medical decision making (see chart for details).    MDM Rules/Calculators/A&P                          BP 100/70 (BP Location: Left Arm)   Pulse 89   Temp 98.8 F (37.1 C) (Oral)   Resp 14   Ht 5\' 9"  (1.753 m)   Wt 72.6 kg   SpO2 94%   BMI 23.63 kg/m   Final Clinical Impression(s) / ED Diagnoses Final diagnoses:  Suicidal ideation  Homicidal ideation  Polysubstance abuse (HCC)    Rx / DC Orders ED Discharge Orders    None     7:54 PM Patient with history of bipolar and polysubstance abuse who presenting accompanied by [facility]PD for complaints of command hallucination, as well as SI and HI.  Will perform medical screening exam and once cleared, will consult TTS for further managements of his condition.  IVC paper filed for Dr. to sign.   10:35 PM Patient is medically cleared and can be assessed by TTS for his psychiatric illness.  CREW GOREN was evaluated in Emergency Department on 10/17/2019 for the symptoms described in the history of present illness. He  was evaluated in the context of the global COVID-19 pandemic, which necessitated consideration that the patient might be at risk for infection with the SARS-CoV-2 virus that causes COVID-19. Institutional protocols and algorithms that pertain to the evaluation of patients at risk for COVID-19 are in a state of rapid change based on information released by regulatory bodies including the CDC and federal and state organizations. These policies and algorithms were followed during the patient's care in the ED.    10/19/2019, PA-C 10/17/19 2236    2237, MD 10/18/19 1725

## 2019-10-17 NOTE — ED Triage Notes (Signed)
Pt brought in by [facility] under voluntary state. Pt says states he is suicidal and homicidal. Pt says is he more homicidal than suicidal. No specific SI plan, did have plan "for someone random" for HI with a knife. Pt states he is bipolar and taking meds as prescribed. Hearing voices in his head. Seen by his ACT team today.  ETOH, did take oxycodone today.

## 2019-10-18 DIAGNOSIS — F1094 Alcohol use, unspecified with alcohol-induced mood disorder: Secondary | ICD-10-CM

## 2019-10-18 DIAGNOSIS — F250 Schizoaffective disorder, bipolar type: Secondary | ICD-10-CM

## 2019-10-18 NOTE — ED Notes (Signed)
Patient awake breakfast tray delivered

## 2019-10-18 NOTE — ED Notes (Signed)
Patient sleeping/rise and fall of chest breathing observed 

## 2019-10-18 NOTE — ED Notes (Signed)
PT IS IVC

## 2019-10-18 NOTE — Consult Note (Signed)
[facility] ED Discharge  10/18/2019 11:04 AM [name]  MRN:  [id number] Principal Problem: Alcohol-induced mood disorder [facility]) Discharge Diagnoses: Principal Problem:   Alcohol-induced mood disorder (HCC) Active Problems:   Schizoaffective disorder, bipolar type (HCC)  Subjective: "I'm better."  CLient reports he started having suicidal ideations last night after drinking alcohol.  On assessment today, he is clear and coherent with no suicidal/homicidal ideations or withdrawal symptoms.  His hallucinations are at his baseline with hearing and seeing people at times, no command hallucinations.  Reports compliance with his psychiatric medications and has an ACT team, [facility].  He has a place to live and feels safe to return.  [name] reviewed the client and concurs with the plan to discharge.    HPI per [name]: Patient presenting as a walk-in at [facility] due to [facility] and HI with plan to hurt himself or someone else with knife. Patient reported having access to knives in his home and no guns. When asked who he would hurt, patient stated "I don't know those people". Patient reported being suicidal all his life.  Patient reported 3x past suicide attempts with last one being 03/2019 when he cut his hand and received inpatient psych treatment. Patient denied current self-harming behaviors. Patient reported auditory hallucinations, unable to provide details and denied command voices. Patient reported 8 hrs sleep and poor appetite. Patient reported worsening depressive symptoms. Patient reported alcohol usage of 2-3 beers daily and oxycodone pills, 1-2 daily. Patient stated, "I was drinking a lot and got scared, I just want to dry out".   Patient is currently seeing [name] / Act Team, last seen today. Patient reported getting a shot every 3 months. Patient reported medication is working. Patient was cooperative during assessment.   Total Time spent with patient: 45 minutes  Past  Psychiatric History: schizoaffective disorder, polysubstance d/o  Past Medical History:  Past Medical History:  Diagnosis Date  . Anxiety   . Bipolar 1 disorder (HCC)   . Stroke [facility])     Past Surgical History:  Procedure Laterality Date  . CHOLECYSTECTOMY N/A 12/24/2012   Procedure: LAPAROSCOPIC CHOLECYSTECTOMY;  Surgeon: Cherylynn Ridges, MD;  [facility] OR;  Service: General;  Laterality: N/A;  . FINGER SURGERY     Family History:  Family History  Problem Relation Age of Onset  . Heart disease Mother    Family Psychiatric  History: none Social History:  Social History   Substance and Sexual Activity  Alcohol Use Yes  . Alcohol/week: 20.0 standard drinks  . Types: 20 Cans of beer per week   Comment: BAC was clear     Social History   Substance and Sexual Activity  Drug Use No  . Types: Marijuana   Comment: UDS was clear    Social History   Socioeconomic History  . Marital status: Single    Spouse name: Not on file  . Number of children: Not on file  . Years of education: Not on file  . Highest education level: Not on file  Occupational History  . Not on file  Tobacco Use  . Smoking status: Current Every Day Smoker    Packs/day: 1.00    Years: 5.00    Pack years: 5.00    Types: Cigarettes  . Smokeless tobacco: Never Used  Substance and Sexual Activity  . Alcohol use: Yes    Alcohol/week: 20.0 standard drinks    Types: 20 Cans of beer per week  Comment: BAC was clear  . Drug use: No    Types: Marijuana    Comment: UDS was clear  . Sexual activity: Never    Birth control/protection: Condom  Other Topics Concern  . Not on file  Social History Narrative  . Not on file   Social Determinants of Health   Financial Resource Strain:   . Difficulty of Paying Living Expenses: Not on file  Food Insecurity:   . Worried About Programme researcher, broadcasting/film/video in the Last Year: Not on file  . Ran Out of Food in the Last Year: Not on file  Transportation Needs:   .  Lack of Transportation (Medical): Not on file  . Lack of Transportation (Non-Medical): Not on file  Physical Activity:   . Days of Exercise per Week: Not on file  . Minutes of Exercise per Session: Not on file  Stress:   . Feeling of Stress : Not on file  Social Connections:   . Frequency of Communication with Friends and Family: Not on file  . Frequency of Social Gatherings with Friends and Family: Not on file  . Attends Religious Services: Not on file  . Active Member of Clubs or Organizations: Not on file  . Attends Banker Meetings: Not on file  . Marital Status: Not on file    Has this patient used any form of tobacco in the last 30 days? (Cigarettes, Smokeless Tobacco, Cigars, and/or Pipes) A prescription for an FDA-approved tobacco cessation medication was offered at discharge and the patient refused  Current Medications: Current Facility-Administered Medications  Medication Dose Route Frequency Provider Last Rate Last Admin  . acetaminophen (TYLENOL) tablet 650 mg  650 mg Oral Q4H PRN Fayrene Helper, PA-C      . alum & mag hydroxide-simeth (MAALOX/MYLANTA) 200-200-20 MG/5ML suspension 30 mL  30 mL Oral Q6H PRN Fayrene Helper, PA-C      . LORazepam (ATIVAN) injection 0-4 mg  0-4 mg Intravenous Q6H Fayrene Helper, PA-C       Or  . LORazepam (ATIVAN) tablet 0-4 mg  0-4 mg Oral Q6H Fayrene Helper, PA-C      . [START ON 10/20/2019] LORazepam (ATIVAN) injection 0-4 mg  0-4 mg Intravenous Q12H Fayrene Helper, PA-C       Or  . Melene Muller ON 10/20/2019] LORazepam (ATIVAN) tablet 0-4 mg  0-4 mg Oral Q12H Fayrene Helper, PA-C      . nicotine (NICODERM CQ - dosed in mg/24 hours) patch 21 mg  21 mg Transdermal Daily Fayrene Helper, PA-C      . ondansetron [facility]) tablet 4 mg  4 mg Oral Q8H PRN Fayrene Helper, PA-C      . thiamine tablet 100 mg  100 mg Oral Daily Fayrene Helper, PA-C   100 mg at 10/18/19 0018   Or  . thiamine (B-1) injection 100 mg  100 mg Intravenous Daily Fayrene Helper, PA-C      . zolpidem  (AMBIEN) tablet 5 mg  5 mg Oral QHS PRN Fayrene Helper, PA-C       Current Outpatient Medications  Medication Sig Dispense Refill  . INVEGA TRINZA 819 MG/2.625ML injection Inject 819 mg into the muscle every 3 (three) months.    . traZODone (DESYREL) 150 MG tablet Take 1 tablet (150 mg total) by mouth at bedtime as needed for sleep. 30 tablet 2   PTA Medications: (Not in a hospital admission)   Musculoskeletal: Strength & Muscle Tone: within normal limits Gait & Station: normal  Patient leans: N/A  Psychiatric Specialty Exam: Physical Exam Vitals and nursing note reviewed.  Constitutional:      Appearance: Normal appearance.  HENT:     Head: Normocephalic.     Nose: Nose normal.  Pulmonary:     Effort: Pulmonary effort is normal.  Musculoskeletal:        General: Normal range of motion.     Cervical back: Normal range of motion.  Neurological:     General: No focal deficit present.     Mental Status: He is alert and oriented to person, place, and time.  Psychiatric:        Attention and Perception: Attention and perception normal.        Mood and Affect: Mood is anxious. Affect is blunt.        Speech: Speech normal.        Behavior: Behavior normal. Behavior is cooperative.        Thought Content: Thought content normal.        Cognition and Memory: Cognition and memory normal.        Judgment: Judgment normal.     Review of Systems  Psychiatric/Behavioral: The patient is nervous/anxious.   All other systems reviewed and are negative.   Blood pressure 100/70, pulse 89, temperature 98.8 F (37.1 C), temperature source Oral, resp. rate 14, height 5\' 9"  (1.753 m), weight 72.6 kg, SpO2 94 %.Body mass index is 23.63 kg/m.  General Appearance: Casual  Eye Contact:  Good  Speech:  Normal Rate  Volume:  Normal  Mood:  Anxious  Affect:  Blunt  Thought Process:  Coherent and Descriptions of Associations: Intact  Orientation:  Full (Time, Place, and Person)  Thought  Content:  Hallucinations: Auditory Visual, baseline  Suicidal Thoughts:  No  Homicidal Thoughts:  No  Memory:  Immediate;   Good Recent;   Good Remote;   Good  Judgement:  Fair  Insight:  Fair  Psychomotor Activity:  Normal  Concentration:  Concentration: Good and Attention Span: Good  Recall:  Good  Fund of Knowledge:  Fair  Language:  Good  Akathisia:  No  Handed:  Right  AIMS (if indicated):     Assets:  Housing Leisure Time Physical Health Resilience Social Support  ADL's:  Intact  Cognition:  WNL  Sleep:        Demographic Factors:  Male, Caucasian and Living alone  Loss Factors: NA  Historical Factors: NA  Risk Reduction Factors:   Sense of responsibility to family, Positive social support and Positive therapeutic relationship  Continued Clinical Symptoms:  Anxiety-mild, hallucinations-baseline  Cognitive Features That Contribute To Risk:  None    Suicide Risk:  Minimal: No identifiable suicidal ideation.  Patients presenting with no risk factors but with morbid ruminations; may be classified as minimal risk based on the severity of the depressive symptoms   Plan Of Care/Follow-up recommendations:  Schizoaffective disorder, bipolar type: -Continue Invega Trinza  Insomnia: -Continue Trazodone 150 mg daily at bedtime  Alcohol abuse: -Refrain from alcohol and drug use -Attend 12-step program with a sponsor -Ativan alcohol detox protocol initiated while in the ED  Activity:  as tolerated Diet:  heart healthy diet  Disposition: discharge home , NP 10/18/2019, 11:04 AM

## 2019-10-18 NOTE — ED Notes (Addendum)
Jeffery Conn, NP, recommends observation for safety and stabilization with pscyh reassessment in the AM.   Per RN note, patient is under IVC.

## 2019-10-18 NOTE — BH Assessment (Addendum)
[facility] Assessment Progress Note  Per Nanine Means, DNP, this pt does not require psychiatric hospitalization at this time.  Pt presents under IVC initiated by EDP Mancel Bale, MD, which has been rescinded by [facility].  Pt is to be discharged from [facility] with recommendation to continue treatment with the [facility] ACT Team.  This has been included in pt's discharge instructions.  At 10:16 this Clinical research associate called [facility] and spoke to [place] at a remote office.  She reports that pt's ACT Team is currently in their morning meeting.  She has taken my message for them, however, informing them of pt's disposition and requesting transportation back home for him.  She will have them call me.  Return call is pending as of this writing.  Pt's nurse, Kendal Hymen, has been notified.  Doylene Canning, MA Triage Specialist [phone]   Addendum:  At 10:38 Judeth Cornfield calls back from [facility].  Due to Covid-19 they are currently unable to provide transportation.  This will be staffed with Catha Nottingham to discuss consideration for a social work consult to address transportation needs.  Doylene Canning, [place] Behavioral Health Coordinator [phone]

## 2019-10-18 NOTE — Discharge Instructions (Signed)
For your behavioral health needs, you are advised to continue treatment with the [facility] ACT Team: ° °     [facility] °     [address] °     [place], [place] [address] °     [phone]

## 2021-07-09 ENCOUNTER — Encounter (HOSPITAL_COMMUNITY): Payer: Self-pay | Admitting: Oncology

## 2021-07-09 ENCOUNTER — Emergency Department (HOSPITAL_COMMUNITY)
Admission: EM | Admit: 2021-07-09 | Discharge: 2021-07-09 | Discharge status: Against Medical Advice | Payer: Medicaid Other | Attending: Emergency Medicine | Admitting: Emergency Medicine

## 2021-07-09 ENCOUNTER — Emergency Department (HOSPITAL_COMMUNITY): Payer: Medicaid Other

## 2021-07-09 ENCOUNTER — Other Ambulatory Visit: Payer: Self-pay

## 2021-07-09 DIAGNOSIS — K6289 Other specified diseases of anus and rectum: Secondary | ICD-10-CM

## 2021-07-09 DIAGNOSIS — K3189 Other diseases of stomach and duodenum: Secondary | ICD-10-CM | POA: Insufficient documentation

## 2021-07-09 DIAGNOSIS — K5900 Constipation, unspecified: Secondary | ICD-10-CM

## 2021-07-09 LAB — COMPREHENSIVE METABOLIC PANEL
ALT: 24 U/L (ref 0–44)
AST: 21 U/L (ref 15–41)
Albumin: 4 g/dL (ref 3.5–5.0)
Alkaline Phosphatase: 74 U/L (ref 38–126)
Anion gap: 8 (ref 5–15)
BUN: 6 mg/dL (ref 6–20)
CO2: 26 mmol/L (ref 22–32)
Calcium: 9.5 mg/dL (ref 8.9–10.3)
Chloride: 106 mmol/L (ref 98–111)
Creatinine, Ser: 0.92 mg/dL (ref 0.61–1.24)
GFR, Estimated: >60 mL/min (ref >60)
Glucose, Bld: 112 mg/dL — ABNORMAL HIGH (ref 70–99)
Potassium: 3.8 mmol/L (ref 3.5–5.1)
Sodium: 140 mmol/L (ref 135–145)
Total Bilirubin: 0.6 mg/dL (ref 0.3–1.2)
Total Protein: 7.7 g/dL (ref 6.5–8.1)

## 2021-07-09 LAB — POC OCCULT BLOOD, ED: Fecal Occult Bld: NEGATIVE

## 2021-07-09 LAB — CBC WITH DIFFERENTIAL/PLATELET
Abs Immature Granulocytes: 0.03 10*3/uL (ref 0.00–0.07)
Basophils Absolute: 0 10*3/uL (ref 0.0–0.1)
Basophils Relative: 0 %
Eosinophils Absolute: 0.1 10*3/uL (ref 0.0–0.5)
Eosinophils Relative: 1 %
HCT: 48.1 % (ref 39.0–52.0)
Hemoglobin: 16.9 g/dL (ref 13.0–17.0)
Immature Granulocytes: 0 %
Lymphocytes Relative: 19 %
Lymphs Abs: 1.7 10*3/uL (ref 0.7–4.0)
MCH: 34.1 pg — ABNORMAL HIGH (ref 26.0–34.0)
MCHC: 35.1 g/dL (ref 30.0–36.0)
MCV: 97.2 fL (ref 80.0–100.0)
Monocytes Absolute: 0.6 10*3/uL (ref 0.1–1.0)
Monocytes Relative: 7 %
Neutro Abs: 6.3 10*3/uL (ref 1.7–7.7)
Neutrophils Relative %: 73 %
Platelets: 222 10*3/uL (ref 150–400)
RBC: 4.95 MIL/uL (ref 4.22–5.81)
RDW: 13.1 % (ref 11.5–15.5)
WBC: 8.7 10*3/uL (ref 4.0–10.5)
nRBC: 0 % (ref 0.0–0.2)

## 2021-07-09 MED ORDER — IOHEXOL 300 MG/ML  SOLN
100.0000 mL | Freq: Once | INTRAMUSCULAR | Status: AC | PRN
  Administered 2021-07-09: 100 mL via INTRAVENOUS

## 2021-07-09 NOTE — ED Notes (Signed)
Pt not in room, not in restroom, not in CT, not answering number listed in chart. Assumed pt eloped.

## 2021-07-09 NOTE — ED Provider Notes (Signed)
[facility]-EMERGENCY DEPT Provider Note   CSN: [id number] Arrival date & time: 07/09/21  1414     History  Chief Complaint  Patient presents with   Constipation    [name] is a 51 y.o. male.  Patient reports several months of progressively worsening abdominal pain and rectal pain.  States he always strains on the toilet and believes he has hemorrhoids.  He has been using over-the-counter medications without relief.  Unknown when he last had a regular bowel movement.  States he strains any sometimes sees blood with wiping.  Stool itself is brown.  Denies any nausea, vomiting, fever or chills. Previous cholecystectomy.  No other surgeries.  Has never been evaluated for hemorrhoids previously. States his stool is sometimes red and sometimes brown.  Sometimes there is blood when he wipes but he always strains.  Does not know when he is last been regular  The history is provided by the patient.  Constipation Associated symptoms: abdominal pain   Associated symptoms: no dysuria, no fever, no nausea and no vomiting       Home Medications Prior to Admission medications   Medication Sig Start Date End Date Taking? Authorizing Provider  INVEGA TRINZA 819 MG/2.625ML injection Inject 819 mg into the muscle every 3 (three) months. 10/02/19   [provider]  traZODone (DESYREL) 150 MG tablet Take 1 tablet (150 mg total) by mouth at bedtime as needed for sleep. 04/03/19   Malvin JohnsFarah, Brian, MD      Allergies    Patient has no known allergies.    Review of Systems   Review of Systems  Constitutional:  Negative for activity change, appetite change and fever.  Respiratory:  Negative for chest tightness and shortness of breath.   Cardiovascular:  Negative for chest pain.  Gastrointestinal:  Positive for abdominal pain and constipation. Negative for nausea and vomiting.  Genitourinary:  Negative for dysuria and hematuria.  Musculoskeletal:  Negative for  arthralgias and myalgias.  Skin:  Negative for rash.  Neurological:  Negative for dizziness, weakness and headaches.   all other systems are negative except as noted in the HPI and PMH.   Physical Exam Updated Vital Signs BP 115/89 (BP Location: Left Arm)   Pulse 64   Temp 98.5 F (36.9 C) (Oral)   Resp 16   SpO2 98%  Physical Exam Vitals and nursing note reviewed.  Constitutional:      General: He is not in acute distress.    Appearance: He is well-developed.  HENT:     Head: Normocephalic and atraumatic.     Mouth/Throat:     Pharynx: No oropharyngeal exudate.  Eyes:     Conjunctiva/sclera: Conjunctivae normal.     Pupils: Pupils are equal, round, and reactive to light.  Neck:     Comments: No meningismus. Cardiovascular:     Rate and Rhythm: Normal rate and regular rhythm.     Heart sounds: Normal heart sounds. No murmur heard. Pulmonary:     Effort: Pulmonary effort is normal. No respiratory distress.     Breath sounds: Normal breath sounds.  Abdominal:     Palpations: Abdomen is soft.     Tenderness: There is no abdominal tenderness. There is no guarding or rebound.  Genitourinary:    Comments: No visible external hemorrhoids or abscess.  There is tenderness to palpation rectal exam. Musculoskeletal:        General: No tenderness. Normal range of motion.     Cervical  back: Normal range of motion and neck supple.  Skin:    General: Skin is warm.  Neurological:     Mental Status: He is alert and oriented to person, place, and time.     Cranial Nerves: No cranial nerve deficit.     Motor: No abnormal muscle tone.     Coordination: Coordination normal.     Comments: No ataxia on finger to nose bilaterally. No pronator drift. 5/5 strength throughout. CN 2-12 intact.Equal grip strength. Sensation intact.   Psychiatric:        Behavior: Behavior normal.    ED Results / Procedures / Treatments   Labs (all labs ordered are listed, but only abnormal results are  displayed) Labs Reviewed  COMPREHENSIVE METABOLIC PANEL - Abnormal; Notable for the following components:      Result Value   Glucose, Bld 112 (*)    All other components within normal limits  CBC WITH DIFFERENTIAL/PLATELET - Abnormal; Notable for the following components:   MCH 34.1 (*)    All other components within normal limits  POC OCCULT BLOOD, ED    EKG None  Radiology DG Abdomen 1 View  Result Date: 07/09/2021 CLINICAL DATA:  Abdominal pain, constipation EXAM: ABDOMEN - 1 VIEW COMPARISON:  12/18/2012 FINDINGS: Two supine frontal views of the abdomen and pelvis are obtained. No bowel obstruction or ileus. Minimal stool throughout the colon. There are no masses or abnormal calcifications. Cholecystectomy clips right upper quadrant. No acute bony abnormality. IMPRESSION: 1. Unremarkable bowel gas pattern. Electronically Signed   By: Sharlet Salina M.D.   On: 07/09/2021 15:19   CT ABDOMEN PELVIS W CONTRAST  Result Date: 07/09/2021 CLINICAL DATA:  Abdominal pain, acute, nonlocalizing rectal pain. Evaluate for abscess. Additional history constipation x1 month. EXAM: CT ABDOMEN AND PELVIS WITH CONTRAST TECHNIQUE: Multidetector CT imaging of the abdomen and pelvis was performed using the standard protocol following bolus administration of intravenous contrast. RADIATION DOSE REDUCTION: This exam was performed according to the departmental dose-optimization program which includes automated exposure control, adjustment of the mA and/or kV according to patient size and/or use of iterative reconstruction technique. CONTRAST:  OMNIPAQUE IOHEXOL 300 MG/ML  SOLN COMPARISON:  CTA abdomen and pelvis 12/24/2012 FINDINGS: Lower chest: No acute abnormality. Hepatobiliary: 15 cm in length liver with mild steatosis there is no mass enhancement in the visualized liver with extreme liver dome excluded from the exam. Interval cholecystectomy without significant biliary prominence. Prominent main portal  vein measures 1.7 cm but is unchanged. Pancreas: No focal abnormality or ductal dilatation. Spleen: No focal abnormality or splenomegaly. Adrenals/Urinary Tract: There is no adrenal mass. Persistent fetal lobation of the kidneys is again seen but no renal cortical mass. There is no urinary stone or obstruction. There is no bladder thickening. There left-sided pelvic phleboliths. Stomach/Bowel: The stomach is distended with fluid with mild fold thickening and mucosal enhancement. There is a rounded masslike mucosal abnormality at the junction of the duodenal bulb and descending segment warranting endoscopic follow-up. This is estimated to measure 1.5 x 1.3 cm on series 2 axial 19. Remainder of the small bowel is unremarkable. The appendix is normal caliber. There is mild fecal retention throughout the large intestine as well as uncomplicated left-sided diverticula most numerous in the sigmoid. There is no evidence of acute diverticulitis. There is noted mild mucosal enhancement in the rectum without inflammatory changes and could be due to underdistention or a mild proctitis. Vascular/Lymphatic: The aorta and branch arteries are unremarkable. There are a  few borderline to slightly prominent gastrohepatic ligament lymph nodes, largest is 1.1 cm in short axis on 2:16 but this was seen previously and seems unchanged. There is no further adenopathy. Reproductive: There is no prostatomegaly. Other: Small umbilical fat hernia. There is no incarcerated hernia, no free air, free hemorrhage or free fluid and no abscess is seen. There are multiple nodular subcutaneous masses in the right buttock, ranging in size from just under 1 cm up to 2.8 x 2 cm. These were not seen previously and could be due to prior injections but clinical correlation is advised. Musculoskeletal: There are mild degenerative changes of the lumbar spine. No acute or other significant osseous findings. IMPRESSION: 1. Constipation and diverticulosis. No  acute inflammatory changes or bowel obstruction. 2. Mild rectal mucosal enhancement without inflammatory change, could relate to underdistention or mild proctitis but there is no evidence of associated fistula or abscess. 3. Distended stomach, probable gastritis, gastric distention potentially due to recent ingestion or impaired gastric emptying. 4. Focal mucosal prominence or mass 1.5 x 1.3 cm at the junction of the duodenal bulb and descending segment. Endoscopic follow-up recommended. 5. Mildly steatotic liver with stable prominence of the hepatic portal vein 1.7 cm caliber unchanged since 2014. 6. Multiple subcutaneous masses in the right buttock up to 2.8 x 2.0 cm, possibly due to injections but warranting clinical correlation and not seen in 2014. Electronically Signed   By: Almira Bar M.D.   On: 07/09/2021 20:17    Procedures Procedures    Medications Ordered in ED Medications - No data to display  ED Course/ Medical Decision Making/ A&P                           Medical Decision Making Amount and/or Complexity of Data Reviewed Labs:  Decision-making details documented in ED Course. Radiology: ordered and independent interpretation performed. Decision-making details documented in ED Course. ECG/medicine tests: independent interpretation performed. Decision-making details documented in ED Course.  Risk Prescription drug management.  Abdominal pain and rectal pain.  Vitals are stable, no distress.  Will obtain imaging to rule out perirectal abscess.  No hemorrhoids are seen externally  Screening labs normal. No leukocytosis.   Patient eloped from the ED before CT results. Multiple critical patients in the department and code [facility] on inpatient floor.   CT shows constipation, mild proctitis, thickening of duodenum.  Will attempt to contact patient for GI followup.   Addendum: Only contact phone number in chart has been disconnected. Unable to reach patient.      Final  Clinical Impression(s) / ED Diagnoses Final diagnoses:  None    Rx / DC Orders ED Discharge Orders     None         Clide Remmers, Jeannett Senior, MD 07/10/21 7903    Glynn Octave, MD 07/11/21 1004

## 2021-07-09 NOTE — ED Triage Notes (Signed)
Pt bib [facility] from home d/t constipation x one month. Reports abdominal pain.

## 2021-07-09 NOTE — ED Provider Triage Note (Signed)
Emergency Medicine Provider Triage Evaluation Note  [name] , a 51 y.o. male  was evaluated in triage.  Pt complains of hemorrhoids and constipation.  Patient has not had a normal bowel movement in months.  He states that he is able to pass small amounts of stool with straining but is unable to fully pass because of rectal pain from hemorrhoids.  Denies any nausea or vomiting, fever, or chills.  Review of Systems  Positive:  Negative: See above  Physical Exam  BP 104/78 (BP Location: Left Arm)   Pulse 65   Temp 98.5 F (36.9 C) (Oral)   Resp 16   SpO2 98%  Gen:   Awake, no distress   Resp:  Normal effort  MSK:   Moves extremities without difficulty  Other:  Abdominal distention and mild diffuse tenderness.  Medical Decision Making  Medically screening exam initiated at 3:00 PM.  Appropriate orders placed.  [name] was informed that the remainder of the evaluation will be completed by another provider, this initial triage assessment does not replace that evaluation, and the importance of remaining in the ED until their evaluation is complete.     [name] [place], [place] 07/09/21 1501

## 2021-07-16 ENCOUNTER — Telehealth (HOSPITAL_COMMUNITY): Payer: Self-pay

## 2021-08-11 ENCOUNTER — Emergency Department (HOSPITAL_COMMUNITY): Payer: Medicaid Other

## 2021-08-11 ENCOUNTER — Emergency Department (HOSPITAL_COMMUNITY)
Admission: EM | Admit: 2021-08-11 | Discharge: 2021-08-11 | Disposition: A | Discharge status: Home / Self Care | Payer: Medicaid Other | Attending: Emergency Medicine | Admitting: Emergency Medicine

## 2021-08-11 DIAGNOSIS — K5900 Constipation, unspecified: Secondary | ICD-10-CM | POA: Diagnosis present

## 2021-08-11 DIAGNOSIS — K6289 Other specified diseases of anus and rectum: Secondary | ICD-10-CM | POA: Diagnosis not present

## 2021-08-11 MED ORDER — POLYETHYLENE GLYCOL 3350 17 GM/SCOOP PO POWD: 1.0000 | Freq: Once | ORAL | 0 refills | Status: AC

## 2021-08-11 MED ORDER — MESALAMINE 1000 MG RE SUPP: 1000.0000 mg | Freq: Every day | RECTAL | 12 refills | Status: DC

## 2021-08-11 NOTE — Discharge Instructions (Addendum)
You have been seen and discharged from the emergency department.  You have been diagnosed with constipation and proctitis which is some inflammation around the rectum.  Continue to take stool softeners, add MiraLAX.  Use suppository for relief.  It is important that you follow-up with gastroenterology for further evaluation and further care. Take home medications as prescribed. If you have any worsening symptoms, worsening rectal pain, pustular drainage from the rectum or further concerns for your health please return to an emergency department for further evaluation.

## 2021-08-11 NOTE — ED Notes (Signed)
18 ga placed and blood drawn. Pt is also requesting HIV testing, provider notified

## 2021-08-11 NOTE — ED Triage Notes (Signed)
Pt came in with c/o constipation times a few months. Pt states that he has hemmorhoids times a few days. Pt states that he was taking opiods but has not used them in a month. Pt has taken one stool softener to help. NAD noted

## 2021-08-11 NOTE — ED Provider Notes (Signed)
[facility]-EMERGENCY DEPT Provider Note   CSN: [id number] Arrival date & time: 08/11/21  1925     History  Chief Complaint  Patient presents with   Constipation    [name] is a 51 y.o. male.  HPI  51 year old male presents emergency department ongoing constipation and rectal discomfort.  He was seen about a month ago for the same complaint.  Was found to have mild proctitis and constipation however absconded from that visit.  Never had follow-up with GI.  Presents today with ongoing symptoms however constipation slightly improved.  He denies any significantly worse rectal pain, rectal discharge.  He states he is not sexually active and denies participating in anal.  He has had no fever/chills or genitourinary symptoms.  Home Medications Prior to Admission medications   Medication Sig Start Date End Date Taking? Authorizing Provider  mesalamine (CANASA) 1000 MG suppository Place 1 suppository (1,000 mg total) rectally at bedtime. 08/11/21  Yes Ryla Cauthon, Clabe Seal, DO  polyethylene glycol powder (MIRALAX) 17 GM/SCOOP powder Take 255 g by mouth once for 1 dose. 08/11/21 08/11/21 Yes Travonna Swindle, Clabe Seal, DO  INVEGA TRINZA 819 MG/2.625ML injection Inject 819 mg into the muscle every 3 (three) months. 10/02/19   [provider]  traZODone (DESYREL) 150 MG tablet Take 1 tablet (150 mg total) by mouth at bedtime as needed for sleep. 04/03/19   Malvin Johns, MD      Allergies    Patient has no known allergies.    Review of Systems   Review of Systems  Constitutional:  Negative for fever.  Respiratory:  Negative for shortness of breath.   Cardiovascular:  Negative for chest pain.  Gastrointestinal:  Positive for constipation and rectal pain. Negative for abdominal distention, abdominal pain, diarrhea and vomiting.  Skin:  Negative for rash.  Neurological:  Negative for headaches.    Physical Exam Updated Vital Signs BP 107/83   Pulse 73   Temp 97.9 F  (36.6 C) (Oral)   Resp 18   Wt 72.6 kg   SpO2 96%   BMI 23.63 kg/m  Physical Exam Vitals and nursing note reviewed.  Constitutional:      General: He is not in acute distress.    Appearance: Normal appearance.  HENT:     Head: Normocephalic.     Mouth/Throat:     Mouth: Mucous membranes are moist.  Cardiovascular:     Rate and Rhythm: Normal rate.  Pulmonary:     Effort: Pulmonary effort is normal. No respiratory distress.  Abdominal:     General: There is no distension.     Palpations: Abdomen is soft.     Tenderness: There is no abdominal tenderness. There is no guarding.  Genitourinary:    Comments: Rectal deferred, externally no concerning findings Skin:    General: Skin is warm.  Neurological:     Mental Status: He is alert and oriented to person, place, and time. Mental status is at baseline.  Psychiatric:        Mood and Affect: Mood normal.     ED Results / Procedures / Treatments   Labs (all labs ordered are listed, but only abnormal results are displayed) Labs Reviewed - No data to display  EKG None  Radiology DG Abdomen 1 View  Result Date: 08/11/2021 CLINICAL DATA:  Constipation. EXAM: ABDOMEN - 1 VIEW COMPARISON:  Abdominal radiograph dated 07/09/2021 and CT dated 07/09/2021 FINDINGS: Mild colonic stool burden. No bowel dilatation or evidence of  obstruction. No free air or radiopaque calculi. Right upper quadrant cholecystectomy clips. The osseous structures are intact the soft tissues are unremarkable. IMPRESSION: No bowel obstruction.  No significant stool burden. Electronically Signed   By: Elgie Collard M.D.   On: 08/11/2021 21:33    Procedures Procedures    Medications Ordered in ED Medications - No data to display  ED Course/ Medical Decision Making/ A&P                           Medical Decision Making Amount and/or Complexity of Data Reviewed Radiology: ordered.  Risk Prescription drug management.   Presents emergency  department concern for constipation ongoing rectal discomfort.  Externally there are no concerning rectal findings, rectal digital exam deferred.  Patient had a CT done a month ago that showed mild proctitis and constipation.  Abdomen is benign and nondistended.  X-ray shows no obstructive pattern.  Patient denies being sexually active/participating in anal.  He denies any purulent drainage, he has a low suspicion for STD.  We will plan to treat patient symptomatically and encourage outpatient GI follow-up.  Discussed strict return to ED instructions including purulent drainage, worsening you have been seen and discharged from the emergency department.  Follow-up with your primary provider for further evaluation and further care. Take home medications as prescribed. If you have any worsening symptoms or further concerns for your health please return to an emergency department for further evaluation.,  Concern for STD.  Patient at this time appears safe and stable for discharge and close outpatient follow up. Discharge plan and strict return to ED precautions discussed, patient verbalizes understanding and agreement.        Final Clinical Impression(s) / ED Diagnoses Final diagnoses:  Constipation, unspecified constipation type  Rectal pain    Rx / DC Orders ED Discharge Orders          Ordered    mesalamine (CANASA) 1000 MG suppository  Daily at bedtime        08/11/21 2243    polyethylene glycol powder (MIRALAX) 17 GM/SCOOP powder   Once        08/11/21 2243              Amna Welker, Clabe Seal, DO 08/11/21 2304

## 2022-01-22 ENCOUNTER — Other Ambulatory Visit: Payer: Self-pay

## 2022-01-22 ENCOUNTER — Emergency Department (HOSPITAL_COMMUNITY)
Admission: EM | Admit: 2022-01-22 | Discharge: 2022-01-24 | Disposition: A | Discharge status: Home / Self Care | Payer: Medicaid Other | Attending: Emergency Medicine | Admitting: Emergency Medicine

## 2022-01-22 DIAGNOSIS — Y900 Blood alcohol level of less than 20 mg/100 ml: Secondary | ICD-10-CM | POA: Insufficient documentation

## 2022-01-22 DIAGNOSIS — R45851 Suicidal ideations: Secondary | ICD-10-CM | POA: Insufficient documentation

## 2022-01-22 DIAGNOSIS — F1094 Alcohol use, unspecified with alcohol-induced mood disorder: Secondary | ICD-10-CM | POA: Insufficient documentation

## 2022-01-22 DIAGNOSIS — F250 Schizoaffective disorder, bipolar type: Secondary | ICD-10-CM | POA: Diagnosis present

## 2022-01-22 DIAGNOSIS — F19959 Other psychoactive substance use, unspecified with psychoactive substance-induced psychotic disorder, unspecified: Secondary | ICD-10-CM

## 2022-01-22 DIAGNOSIS — F1910 Other psychoactive substance abuse, uncomplicated: Secondary | ICD-10-CM

## 2022-01-22 DIAGNOSIS — Z046 Encounter for general psychiatric examination, requested by authority: Secondary | ICD-10-CM | POA: Diagnosis present

## 2022-01-22 LAB — COMPREHENSIVE METABOLIC PANEL
ALT: 29 U/L (ref 0–44)
AST: 25 U/L (ref 15–41)
Albumin: 4.5 g/dL (ref 3.5–5.0)
Alkaline Phosphatase: 78 U/L (ref 38–126)
Anion gap: 13 (ref 5–15)
BUN: <5 mg/dL — ABNORMAL LOW (ref 6–20)
CO2: 28 mmol/L (ref 22–32)
Calcium: 10 mg/dL (ref 8.9–10.3)
Chloride: 97 mmol/L — ABNORMAL LOW (ref 98–111)
Creatinine, Ser: 0.91 mg/dL (ref 0.61–1.24)
GFR, Estimated: >60 mL/min (ref >60)
Glucose, Bld: 120 mg/dL — ABNORMAL HIGH (ref 70–99)
Potassium: 4.1 mmol/L (ref 3.5–5.1)
Sodium: 138 mmol/L (ref 135–145)
Total Bilirubin: 1 mg/dL (ref 0.3–1.2)
Total Protein: 8 g/dL (ref 6.5–8.1)

## 2022-01-22 LAB — CBC
HCT: 46.2 % (ref 39.0–52.0)
Hemoglobin: 16.6 g/dL (ref 13.0–17.0)
MCH: 33.8 pg (ref 26.0–34.0)
MCHC: 35.9 g/dL (ref 30.0–36.0)
MCV: 94.1 fL (ref 80.0–100.0)
Platelets: 250 10*3/uL (ref 150–400)
RBC: 4.91 MIL/uL (ref 4.22–5.81)
RDW: 12.3 % (ref 11.5–15.5)
WBC: 9.3 10*3/uL (ref 4.0–10.5)
nRBC: 0 % (ref 0.0–0.2)

## 2022-01-22 LAB — RAPID URINE DRUG SCREEN, HOSP PERFORMED
Amphetamines: POSITIVE — AB
Barbiturates: NOT DETECTED
Benzodiazepines: NOT DETECTED
Cocaine: NOT DETECTED
Opiates: NOT DETECTED
Tetrahydrocannabinol: NOT DETECTED

## 2022-01-22 LAB — ACETAMINOPHEN LEVEL: Acetaminophen (Tylenol), Serum: <10 ug/mL — ABNORMAL LOW (ref 10–30)

## 2022-01-22 LAB — SALICYLATE LEVEL: Salicylate Lvl: <7 mg/dL — ABNORMAL LOW (ref 7.0–30.0)

## 2022-01-22 LAB — ETHANOL: Alcohol, Ethyl (B): <10 mg/dL (ref <10)

## 2022-01-22 NOTE — ED Triage Notes (Addendum)
Pt BIB EMS from the [employer].  Intoxicated and reported to EMS that he had received some information that made him want to hurt himself.  He reports he has had 3 beers, 3 percocet, and 3 tylenol today.  Pt reports hx of chronic pain.   Pt states he has stopped using crack and marijuana but does endorse abuse of adderall, oxy, and percocet.

## 2022-01-22 NOTE — ED Provider Notes (Signed)
[facility] EMERGENCY DEPARTMENT Provider Note   CSN: [id number] Arrival date & time: 01/22/22  2025     History {Add pertinent medical, surgical, social history, OB history to HPI:1} Chief Complaint  Patient presents with   Suicidal   Alcohol Intoxication    GEZA BERANEK is a 51 y.o. male.  51 year old male with past medical history of bipolar, homelessness, suicidal ideation, polysubstance abuse presents today for evaluation of suicidal ideation.  Prior to me engaging him in an interview as I approached him he said "hold on ya'll" before he addressed me.  Appearing that he was speaking to someone that was not visible to me or the sitter that is at bedside.  When asked if there is someone else other than [place] that he is able to see or speak to he states "that I rather keep that to myself".  He does endorse passive suicidal ideations.  Denies active plan.  Denies HI.  Requests to have detox services.  States that he has stopped using crack cocaine and marijuana.  The history is provided by the patient. No language interpreter was used.       Home Medications Prior to Admission medications   Medication Sig Start Date End Date Taking? Authorizing Provider  INVEGA TRINZA 819 MG/2.625ML injection Inject 819 mg into the muscle every 3 (three) months. 10/02/19   [provider]  mesalamine (CANASA) 1000 MG suppository Place 1 suppository (1,000 mg total) rectally at bedtime. 08/11/21   Horton, Clabe Seal, DO  traZODone (DESYREL) 150 MG tablet Take 1 tablet (150 mg total) by mouth at bedtime as needed for sleep. 04/03/19   Malvin Johns, MD      Allergies    Patient has no known allergies.    Review of Systems   Review of Systems  Constitutional:  Negative for fever.  Respiratory:  Negative for shortness of breath.   Cardiovascular:  Negative for chest pain.  Psychiatric/Behavioral:  Positive for suicidal ideas. Negative for self-injury and sleep disturbance.    All other systems reviewed and are negative.   Physical Exam Updated Vital Signs BP (!) 126/101   Pulse 83   Temp 97.8 F (36.6 C) (Oral)   Resp 18   SpO2 100%  Physical Exam Vitals and nursing note reviewed.  Constitutional:      General: He is not in acute distress.    Appearance: Normal appearance. He is not ill-appearing.  HENT:     Head: Normocephalic and atraumatic.     Nose: Nose normal.  Eyes:     General: No scleral icterus.    Extraocular Movements: Extraocular movements intact.     Conjunctiva/sclera: Conjunctivae normal.  Cardiovascular:     Rate and Rhythm: Normal rate and regular rhythm.     Pulses: Normal pulses.  Pulmonary:     Effort: Pulmonary effort is normal. No respiratory distress.     Breath sounds: Normal breath sounds. No wheezing or rales.  Abdominal:     General: There is no distension.     Tenderness: There is no abdominal tenderness.  Musculoskeletal:        General: Normal range of motion.     Cervical back: Normal range of motion.  Skin:    General: Skin is warm and dry.  Neurological:     General: No focal deficit present.     Mental Status: He is alert. Mental status is at baseline.  Psychiatric:  Attention and Perception: Attention normal.        Speech: Speech normal. Speech is not rapid and pressured.        Behavior: Behavior is withdrawn. Behavior is not aggressive or combative.        Thought Content: Thought content includes suicidal ideation. Thought content does not include homicidal ideation. Thought content does not include homicidal or suicidal plan.     ED Results / Procedures / Treatments   Labs (all labs ordered are listed, but only abnormal results are displayed) Labs Reviewed  COMPREHENSIVE METABOLIC PANEL - Abnormal; Notable for the following components:      Result Value   Chloride 97 (*)    Glucose, Bld 120 (*)    BUN <5 (*)    All other components within normal limits  SALICYLATE LEVEL - Abnormal;  Notable for the following components:   Salicylate Lvl <7.0 (*)    All other components within normal limits  ACETAMINOPHEN LEVEL - Abnormal; Notable for the following components:   Acetaminophen (Tylenol), Serum <10 (*)    All other components within normal limits  RAPID URINE DRUG SCREEN, HOSP PERFORMED - Abnormal; Notable for the following components:   Amphetamines POSITIVE (*)    All other components within normal limits  ETHANOL  CBC    EKG None  Radiology No results found.  Procedures Procedures  {Document cardiac monitor, telemetry assessment procedure when appropriate:1}  Medications Ordered in ED Medications - No data to display  ED Course/ Medical Decision Making/ A&P                           Medical Decision Making Amount and/or Complexity of Data Reviewed Labs: ordered.   ***  {Document critical care time when appropriate:1} {Document review of labs and clinical decision tools ie heart score, Chads2Vasc2 etc:1}  {Document your independent review of radiology images, and any outside records:1} {Document your discussion with family members, caretakers, and with consultants:1} {Document social determinants of health affecting pt's care:1} {Document your decision making why or why not admission, treatments were needed:1} Final Clinical Impression(s) / ED Diagnoses Final diagnoses:  None    Rx / DC Orders ED Discharge Orders     None

## 2022-01-22 NOTE — ED Notes (Signed)
The pt reports that he last had alcohol  in the past 24 hours.  H last had percocet earlier today co-operative at present

## 2022-01-22 NOTE — ED Notes (Signed)
The pt is talking to  someone not present

## 2022-01-23 DIAGNOSIS — F1910 Other psychoactive substance abuse, uncomplicated: Secondary | ICD-10-CM

## 2022-01-23 DIAGNOSIS — F19959 Other psychoactive substance use, unspecified with psychoactive substance-induced psychotic disorder, unspecified: Secondary | ICD-10-CM

## 2022-01-23 MED ORDER — ZIPRASIDONE MESYLATE 20 MG IM SOLR
20.0000 mg | Freq: Once | INTRAMUSCULAR | Status: AC
  Administered 2022-01-23: 20 mg via INTRAMUSCULAR
  Filled 2022-01-23: qty 20

## 2022-01-23 MED ORDER — LORAZEPAM 2 MG/ML IJ SOLN: 0.0000 mg | Freq: Four times a day (QID) | INTRAMUSCULAR | Status: DC

## 2022-01-23 MED ORDER — CLONIDINE HCL 0.1 MG PO TABS
0.1000 mg | ORAL_TABLET | Freq: Three times a day (TID) | ORAL | Status: DC
  Administered 2022-01-23 – 2022-01-24 (×3): 0.1 mg via ORAL
  Filled 2022-01-23 (×3): qty 1

## 2022-01-23 MED ORDER — HYDROXYZINE HCL 25 MG PO TABS: 25.0000 mg | ORAL_TABLET | Freq: Three times a day (TID) | ORAL | Status: DC | PRN

## 2022-01-23 MED ORDER — LORAZEPAM 2 MG/ML IJ SOLN: 0.0000 mg | Freq: Two times a day (BID) | INTRAMUSCULAR | Status: DC

## 2022-01-23 MED ORDER — LORAZEPAM 1 MG PO TABS: 0.0000 mg | ORAL_TABLET | Freq: Four times a day (QID) | ORAL | Status: DC

## 2022-01-23 MED ORDER — LORAZEPAM 1 MG PO TABS: 0.0000 mg | ORAL_TABLET | Freq: Two times a day (BID) | ORAL | Status: DC

## 2022-01-23 MED ORDER — STERILE WATER FOR INJECTION IJ SOLN
INTRAMUSCULAR | Status: AC
  Filled 2022-01-23: qty 10

## 2022-01-23 MED ORDER — OLANZAPINE 5 MG PO TABS
5.0000 mg | ORAL_TABLET | Freq: Every day | ORAL | Status: DC
  Filled 2022-01-23: qty 1

## 2022-01-23 NOTE — ED Provider Notes (Signed)
Emergency Medicine Observation Re-evaluation Note  [name] is a 51 y.o. male, seen on rounds today.  Pt initially presented to the ED for complaints of Suicidal and Alcohol Intoxication Currently, the patient is asleep, resting comfortably.  Physical Exam  BP (!) 147/100   Pulse 74   Temp 98.4 F (36.9 C) (Oral)   Resp 18   SpO2 100%  Physical Exam Vitals and nursing note reviewed.  Constitutional:      General: He is not in acute distress.    Appearance: He is well-developed.  HENT:     Head: Normocephalic and atraumatic.  Eyes:     Conjunctiva/sclera: Conjunctivae normal.  Cardiovascular:     Rate and Rhythm: Normal rate and regular rhythm.     Heart sounds: No murmur heard. Pulmonary:     Effort: Pulmonary effort is normal. No respiratory distress.  Musculoskeletal:        General: No swelling.     Cervical back: Neck supple.  Skin:    General: Skin is warm and dry.  Neurological:     Mental Status: He is alert.  Psychiatric:        Mood and Affect: Mood normal.     ED Course / MDM  EKG:   I have reviewed the labs performed to date as well as medications administered while in observation.  Recent changes in the last 24 hours include BH assessment and recommendation for inpatient psychiatric admission.  Plan  Current plan is for inpatient psychiatric admission.    Glendora Score, MD 01/23/22 [phone]

## 2022-01-23 NOTE — ED Notes (Signed)
Security bag [phone] 1 black phone with screen intact, $161.65 in cash and change, 1 truist visa card, 1 verizon prepaid card, 1 [employer] plasma visa card, 1 drivers license, 1 social security card, 1 medicaid card, 1 green button up shirt, 1 pair of brown pants, 1 pair of brown boots, 1 brown jacket, 1 white charging cable, 1 pack of cigarette, 1 blue lighter. Number of bags 3, locker 1 in purple

## 2022-01-23 NOTE — ED Notes (Signed)
Pt has sitter at this time and is being TTS at this time

## 2022-01-23 NOTE — ED Notes (Signed)
Pt was offered ativan as he is getting more tremors and becoming more sensitive to lights and sounds

## 2022-01-23 NOTE — ED Notes (Signed)
Pt is at nurses station stating that he wants his belongings and he wants to leave. Pt is yelling and foul language at staff. States that he will come back here and take his belongings. Security called, charge nurse made aware. Provider was called and made aware and was advised that he will be taking out IVC papers on pt.

## 2022-01-23 NOTE — Progress Notes (Signed)
[facility] MD Progress Note  01/23/2022 12:41 PM [name]  MRN:  [id number] Subjective:   Principal Problem: Substance-induced psychotic disorder [facility]) Diagnosis: Principal Problem:   Substance-induced psychotic disorder ([place]) Active Problems:   Alcohol-induced mood disorder ([place])   Schizoaffective disorder, bipolar type ([place])  Total Time spent with patient: 30 minutes  [name] is a 51 year old, male, with mental health history significant for Schizoaffective Disorder, Bipolar Disorder,Polysubstance Use, Alcohol Use Disorder, SI, HI, under IVC petition, re-evaluated per TTS consult due to psychotic symptoms, substance intoxication, and suicidal ideations.  Patient evaluated by TTS provider earlier this morning and was found to be intoxicated, responding verbally to internal stimuli, with admitted use of multiple substance prior to EMS transporting him here to [facility] including percocet, shots of liquor, beer, and patient admitted to this writer use of other pills including the percocet.  Patient required IM agitation medications this morning (given Ziprasidone IM 20 mg) after attempting to leave and yelling at staff. Patient has not exhibited any inappropriate behaviors since this around 0300 AM this morning.  Patient denies history of seizures related to substance or alcohol withdrawal. Patient request substance abuse treatment.  On re-evaluation today, patient, is evaluated laying bed, calm, cooperative, he is exhibiting intermittent visible generalized tremors, although denies nausea, vomiting, or HA. Patient is oriented x 4. At present, he is denies suicidal thoughts or thoughts of harming others. He was able to achieve sleep after receiving agitation medications, although endorses poor sleep. Patient endorse intake of alcohol nearly daily consisting mostly of beer with intake of liquor when he is able to get it. Patient has been followed by outpatient ACTT team for psychiatric medication and  symptom management, patient states he no longer receives any outpatient treatment and stopped taking antipsychotic medications sometime ago.    During evaluation [name] is laying in bed in no acute distress. He is alert, oriented x 4, calm, cooperative and with inattentiveness at times.  His mood is dysphoric with congruent affect. His speech is low volume, slow, but coherent and clear with appropriate behavior.  Objectively, patient does not appear to be responding to internal stimuli and there is no evidence of active psychosis or mania. Patient appears to be stabilizing after Zyprexa and Ativan, although presented with active psychotic symptoms less than 12 hours ago and has a diagnosis of schizoaffective disorder with polysubstance use disorder. Given history, patient unreliably at this able to contract for safety, will continue to recommend inpatient psychiatric treatment for now. Psychiatry to re-assess tomorrow. Patient is under IVC petition and CIWA protocol.   Past Psychiatric History: Schizoaffective Disorder, Bipolar Disorder,Polysubstance Use, Alcohol Use Disorder  Past Medical History:  Past Medical History:  Diagnosis Date   Anxiety    Bipolar 1 disorder ([place])    Stroke [facility])     Past Surgical History:  Procedure Laterality Date   CHOLECYSTECTOMY N/A 12/24/2012   Procedure: LAPAROSCOPIC CHOLECYSTECTOMY;  Surgeon: Gwenyth Ober, MD;  Location: [facility] OR;  Service: General;  Laterality: N/A;   FINGER SURGERY     Family History:  Family History  Problem Relation Age of Onset   Heart disease Mother    Social History:  Social History   Substance and Sexual Activity  Alcohol Use Yes   Alcohol/week: 20.0 standard drinks of alcohol   Types: 20 Cans of beer per week   Comment: BAC was clear     Social History   Substance and Sexual Activity  Drug Use No   Types: Marijuana   Comment: UDS was clear    Social History   Socioeconomic History   Marital status: Single     Spouse name: Not on file   Number of children: Not on file   Years of education: Not on file   Highest education level: Not on file  Occupational History   Not on file  Tobacco Use   Smoking status: Every Day    Packs/day: 1.00    Years: 5.00    Total pack years: 5.00    Types: Cigarettes   Smokeless tobacco: Never  Substance and Sexual Activity   Alcohol use: Yes    Alcohol/week: 20.0 standard drinks of alcohol    Types: 20 Cans of beer per week    Comment: BAC was clear   Drug use: No    Types: Marijuana    Comment: UDS was clear   Sexual activity: Never    Birth control/protection: Condom  Other Topics Concern   Not on file  Social History Narrative   Not on file   Social Determinants of Health   Financial Resource Strain: Not on file  Food Insecurity: Not on file  Transportation Needs: Not on file  Physical Activity: Not on file  Stress: Not on file  Social Connections: Not on file   Additional Social History:    Pain Medications: See MAR Prescriptions: See MAR Over the Counter: See MAR History of alcohol / drug use?: Yes Longest period of sobriety (when/how long): [place] Negative Consequences of Use:  (Unknown) Withdrawal Symptoms: Sweats Name of Substance 1: Alcohol 1 - Age of First Use: 14 1 - Amount (size/oz): Unknown specific amount 1 - Frequency: Daily 1 - Duration: Ongoing 1 - Last Use / Amount: Today 1 - Method of Aquiring: Self 1- Route of Use: Oral Name of Substance 2: Amphetamines 2 - Age of First Use: 22S 2 - Amount (size/oz): 3 percocets 2 - Frequency: Daily 2 - Duration: Ongoing 2 - Last Use / Amount: Today 2 - Method of Aquiring: Unknown 2 - Route of Substance Use: Oral                Sleep: Poor  Appetite:  Fair  Current Medications: Current Facility-Administered Medications  Medication Dose Route Frequency Provider Last Rate Last Admin   cloNIDine (CATAPRES) tablet 0.1 mg  0.1 mg Oral TID Scot Jun, FNP        hydrOXYzine (ATARAX) tablet 25 mg  25 mg Oral TID PRN Scot Jun, FNP       LORazepam (ATIVAN) injection 0-4 mg  0-4 mg Intravenous Q6H Ali, Amjad, PA-C       Or   LORazepam (ATIVAN) tablet 0-4 mg  0-4 mg Oral Q6H Ali, Amjad, PA-C       [START ON 01/25/2022] LORazepam (ATIVAN) injection 0-4 mg  0-4 mg Intravenous Q12H Ali, Amjad, PA-C       Or   [START ON 01/25/2022] LORazepam (ATIVAN) tablet 0-4 mg  0-4 mg Oral Q12H Ali, Amjad, PA-C       OLANZapine (ZYPREXA) tablet 5 mg  5 mg Oral QHS Scot Jun, FNP       Current Outpatient Medications  Medication Sig Dispense Refill   mesalamine (CANASA) 1000 MG suppository Place 1 suppository (1,000 mg total) rectally at bedtime. (Patient not taking: Reported on 01/23/2022) 7 suppository 12    Lab Results:  Results for orders placed or performed during the hospital  encounter of 01/22/22 (from the past 48 hour(s))  Comprehensive metabolic panel     Status: Abnormal   Collection Time: 01/22/22  8:37 PM  Result Value Ref Range   Sodium 138 135 - 145 mmol/L   Potassium 4.1 3.5 - 5.1 mmol/L   Chloride 97 (L) 98 - 111 mmol/L   CO2 28 22 - 32 mmol/L   Glucose, Bld 120 (H) 70 - 99 mg/dL    Comment: Glucose reference range applies only to samples taken after fasting for at least 8 hours.   BUN <5 (L) 6 - 20 mg/dL   Creatinine, Ser 0.91 0.61 - 1.24 mg/dL   Calcium 10.0 8.9 - 10.3 mg/dL   Total Protein 8.0 6.5 - 8.1 g/dL   Albumin 4.5 3.5 - 5.0 g/dL   AST 25 15 - 41 U/L   ALT 29 0 - 44 U/L   Alkaline Phosphatase 78 38 - 126 U/L   Total Bilirubin 1.0 0.3 - 1.2 mg/dL   GFR, Estimated >60 >60 mL/min    Comment: (NOTE) Calculated using the CKD-EPI Creatinine Equation (2021)    Anion gap 13 5 - 15    Comment: Performed at [facility] [address], [place], [place] [address]  Ethanol     Status: None   Collection Time: 01/22/22  8:37 PM  Result Value Ref Range   Alcohol, Ethyl (B) <10 <10 mg/dL    Comment: (NOTE) Lowest  detectable limit for serum alcohol is 10 mg/dL.  For medical purposes only. Performed at [facility], [place] [address] [place], [place] [id number]   Salicylate level     Status: Abnormal   Collection Time: 01/22/22  8:37 PM  Result Value Ref Range   Salicylate Lvl Q000111Q (L) 7.0 - 30.0 mg/dL    Comment: Performed at [facility] [address] [place], [place] [address]  Acetaminophen level     Status: Abnormal   Collection Time: 01/22/22  8:37 PM  Result Value Ref Range   Acetaminophen (Tylenol), Serum <10 (L) 10 - 30 ug/mL    Comment: (NOTE) Therapeutic concentrations vary significantly. A range of 10-30 ug/mL  may be an effective concentration for many patients. However, some  are best treated at concentrations outside of this range. Acetaminophen concentrations >150 ug/mL at 4 hours after ingestion  and >50 ug/mL at 12 hours after ingestion are often associated with  toxic reactions.  Performed at [facility], [place] [address], [place], [place] [address]   cbc     Status: None   Collection Time: 01/22/22  8:37 PM  Result Value Ref Range   WBC 9.3 4.0 - 10.5 K/uL   RBC 4.91 4.22 - 5.81 MIL/uL   Hemoglobin 16.6 13.0 - 17.0 g/dL   HCT 46.2 39.0 - 52.0 %   MCV 94.1 80.0 - 100.0 fL   MCH 33.8 26.0 - 34.0 pg   MCHC 35.9 30.0 - 36.0 g/dL   RDW 12.3 11.5 - 15.5 %   Platelets 250 150 - 400 K/uL   nRBC 0.0 0.0 - 0.2 %    Comment: Performed at [facility], [place] [address], [place], [place] [address]  Rapid urine drug screen (hospital performed)     Status: Abnormal   Collection Time: 01/22/22  8:41 PM  Result Value Ref Range   Opiates NONE DETECTED NONE DETECTED   Cocaine NONE DETECTED NONE DETECTED   Benzodiazepines NONE DETECTED NONE DETECTED  Amphetamines POSITIVE (A) NONE DETECTED   Tetrahydrocannabinol NONE DETECTED NONE DETECTED   Barbiturates NONE DETECTED NONE DETECTED    Comment: (NOTE) DRUG SCREEN FOR MEDICAL PURPOSES ONLY.  IF CONFIRMATION  IS NEEDED FOR ANY PURPOSE, NOTIFY LAB WITHIN 5 DAYS.  LOWEST DETECTABLE LIMITS FOR URINE DRUG SCREEN Drug Class                     Cutoff (ng/mL) Amphetamine and metabolites    1000 Barbiturate and metabolites    200 Benzodiazepine                 200 Opiates and metabolites        300 Cocaine and metabolites        300 THC                            50 Performed at [facility], [address] [place], [place] [address]     Blood Alcohol level:  Lab Results  Component Value Date   ETH <10 01/22/2022   ETH 85 (H) 10/17/2019    Metabolic Disorder Labs: Lab Results  Component Value Date   HGBA1C 5.1 04/02/2019   MPG 100 04/02/2019   No results found for: "PROLACTIN" Lab Results  Component Value Date   CHOL 165 04/02/2019   TRIG 203 (H) 04/02/2019   HDL 56 04/02/2019   CHOLHDL 2.9 04/02/2019   VLDL 41 (H) 04/02/2019   LDLCALC 68 04/02/2019    Physical Findings:  Psychiatric Specialty Exam:  Presentation  General Appearance:  Disheveled  Eye Contact: Fair  Speech: Clear and Coherent  Speech Volume: Normal   Mood and Affect  Mood: Dysphoric  Affect: Restricted   Thought Process  Thought Processes: Linear  Descriptions of Associations:Circumstantial  Orientation:Full (Time, Place and Person)  Thought Content:Rumination; Logical  History of Schizophrenia/Schizoaffective disorder:Yes  Duration of Psychotic Symptoms:Less than six months  Hallucinations:Hallucinations: -- (None at present on assessment (present early AM, patient responding to internal stimuli per TTS evaluation))  Ideas of Reference:None  Suicidal Thoughts:Suicidal Thoughts: No  Homicidal Thoughts:Homicidal Thoughts: No   Sensorium  Memory: Immediate Good; Recent Good; Remote Good  Judgment: Poor  Insight: Lacking   Executive Functions  Concentration: Poor  Attention Span: Poor  Recall: [employer] of  Knowledge: Fair  Language: Fair   Psychomotor Activity  Psychomotor Activity: Psychomotor Activity: Tremor   Assets  Assets: Desire for Improvement; Communication Skills   Sleep  Sleep: Sleep: Fair    Physical Exam: Physical Exam Vitals reviewed.  HENT:     Head: Normocephalic.     Right Ear: External ear normal.     Left Ear: External ear normal.  Eyes:     Extraocular Movements: Extraocular movements intact.     Pupils: Pupils are equal, round, and reactive to light.  Cardiovascular:     Rate and Rhythm: Normal rate and regular rhythm.  Pulmonary:     Effort: Pulmonary effort is normal.  Musculoskeletal:     Cervical back: Normal range of motion.  Neurological:     General: No focal deficit present.     Mental Status: He is alert.    Review of Systems  Psychiatric/Behavioral:  Positive for substance abuse and suicidal ideas.    Blood pressure (!) 147/100, pulse 74, temperature 98.4 F (36.9 C), temperature source Oral, resp. rate 18, SpO2 100 %. There is no height or weight  on file to calculate BMI.   Treatment Plan Summary: Patient case review and discussed with Dr. Dwyane Dee, patient initially recommended for inpatient treatment per NP Bobbitt earlier this morning. Patient reassessed today, appears to be stable and responding appropriately during evaluation, although he is experiencing mild symptoms of alcohol and substance withdrawal, at present he is not responding to internal stimuli.  Psychiatry will re-evaluate in the morning. Patient recommended for inpatient psychiatric treatment for crisis stabilization, medication management, substance treatment and safety. Per TTS provider NP Bobbitt evaluation , patient recommended for inpatient psychiatric treatment however, there is currently no availability at  [facility] for substance-induced thought disorder order treatment. CSW has already faxed patient out.  CSW, RN, EDP notified. Psychiatry will re-evaluate 01/24/2022.  Patient is currently under IVC petition.  Molli Barrows, FNP-C, PMHNP-BC 01/23/2022, 12:41 PM

## 2022-01-23 NOTE — BH Assessment (Signed)
Comprehensive Clinical Assessment (CCA) Note  01/23/2022 [name] [id number]  Disposition: Jeffery Naegeli, NP recommends inpatient psychiatric admission. No [facility] bed availability per [facility]. RN L. Toler and Dr. Casimer Lanius notified of recommendation. Social Worker to assist with placement.  The patient demonstrates the following risk factors for suicide: Chronic risk factors for suicide include: substance use disorder. Acute risk factors for suicide include: unemployment. Protective factors for this patient include: hope for the future. Considering these factors, the overall suicide risk at this point appears to be low. Patient is not appropriate for outpatient follow up.   Flowsheet Row ED from 01/22/2022 in [facility] EMERGENCY DEPARTMENT ED from 08/11/2021 in [facility] from 07/09/2021 in [facility]  C-SSRS RISK CATEGORY No Risk No Risk No Risk      Patient is a 51 year-old single male who presents to the [facility] via [facility] from [employer], after being found intoxicated. Pt reported to EMS that he wants to hurt himself.  Upon assessment, Pt reports he needs to detox. Pt denies SI. Pt states he had 3 or 4 percocet's, a couple of millerlights and some shots today. Pt reports he started drinking this morning and stopped just before dark. He shares he also took the pills in the morning. He says he quit using marijuana a few weeks ago. Pt states he has not slept in 3 days. Pt denies HI. Pt reports a past suicide attempted by overdose, however he is unable to recall when it occurred. Pt states he has been hospitalized for his mental health on and off throughout his life. Pt denies access to guns or other weapons. Pt denies auditory or visual hallucinations, however Pt appeared to respond to internal stimuli throughout assessment. During assessment, Pt looks into the distance and yells "if you say that one more time,  I'm going to wax your ass." Clinician asked Pt who he was talking to and states himself. Pt's UDS is positive for amphetamines.  Pt identifies no stressors when asked, although he is homeless. Pt reports he has no supports. Pt states he has a history of bipolar and schizophrenia. Pt says he had an ACTT Team, however he "let them go because they were money hungry." Pt denies having a psychiatrist or currently taking any medications. Pt denies a history of abuse and reports no legal concerns.   Pt is dressed in scrubs, alert and oriented. Pt speaks loudly and is restless. Pt makes eye contact and affect is anxious. Pt responds to internal stimuli by yelling out. Pt's is lacking insight. Pt is initially cooperative, however he states multiple times "I'm ready to go to sleep" and requests to be detox. Pt threatens to end the assessment at a point in time, however continues answering questions. Pt states "I'm trying to save my life."  Chief Complaint:  Chief Complaint  Patient presents with   Suicidal   Alcohol Intoxication   Visit Diagnosis: Alcohol-induced mood disorder (HCC)    CCA Screening, Triage and Referral (STR)  Patient Reported Information How did you hear about [place]? Other (Comment) (EMS)  What Is the Reason for Your Visit/Call Today? Pt reports "I need to detox. I drank beer and took 3 percocets."  How Long Has This Been Causing You Problems? <Week  What Do You Feel Would Help You the Most Today? Alcohol or Drug Use Treatment; Treatment for Depression or other mood problem   Have You Recently Had Any  Thoughts About Hurting Yourself? No  Are You Planning to Commit Suicide/Harm Yourself At This time? No   Flowsheet Row ED from 01/22/2022 in [facility] EMERGENCY [facility]PART[facility] ED from 08/11/2021 in [facility] from 07/09/2021 in [facility]  C-SSRS RISK CATEGORY No Risk No Risk No Risk        Have you Recently Had Thoughts About Hurting Someone [name]? No  Are You Planning to Harm Someone at This Time? No  Explanation: N/A   Have You Used Any Alcohol or Drugs in the Past 24 Hours? Yes  What Did You Use and How Much? "Couple of Millerlights and some shots."   Do You Currently Have a Therapist/Psychiatrist? No  Name of Therapist/Psychiatrist: Name of Therapist/Psychiatrist: N/A   Have You Been Recently Discharged From Any Office Practice or Programs? No  Explanation of Discharge From Practice/Program: N/A     CCA Screening Triage Referral Assessment Type of Contact: Tele-Assessment  Telemedicine Service Delivery: Telemedicine service delivery: This service was provided via telemedicine using a 2-way, interactive audio and video technology  Is this Initial or Reassessment? Is this Initial or Reassessment?: Initial Assessment  Date Telepsych consult ordered in [facility]:  Date Telepsych consult ordered in [facility]: 01/23/22  Time Telepsych consult ordered in [facility]:  Time Telepsych consult ordered in [facility]: 0033  Location of Assessment: [facility] ED  Provider Location: [facility] Assessment Services   Collateral Involvement: N/A   Does Patient Have a Automotive engineer Guardian? No  Legal Guardian Contact Information: N/A  Copy of Legal Guardianship Form: -- (N/A)  Legal Guardian Notified of Arrival: -- (N/A)  Legal Guardian Notified of Pending Discharge: -- (N/A)  If Minor and Not Living with Parent(s), Who has Custody? N/A  Is CPS involved or ever been involved? Never  Is APS involved or ever been involved? Never   Patient Determined To Be At Risk for Harm To Self or Others Based on Review of Patient Reported Information or Presenting Complaint? No  Method: -- (N/A)  Availability of Means: -- (N/A)  Intent: -- (N/A)  Notification Required: -- (N/A)  Additional Information for Danger to Others Potential: Active psychosis  Additional Comments for Danger to  Others Potential: N/A  Are There Guns or Other Weapons in Your Home? No  Types of Guns/Weapons: N/A  Are These Weapons Safely Secured?                            -- (N/A)  Who Could Verify You Are Able To Have These Secured: N/A  Do You Have any Outstanding Charges, Pending Court Dates, Parole/Probation? N/A  Contacted To Inform of Risk of Harm To Self or Others: -- (N/A)    Does Patient Present under Involuntary Commitment? No    [place] of Residence: [name]   Patient Currently Receiving the Following Services: Not Receiving Services   Determination of Need: Emergent (2 hours)   Options For Referral: Inpatient Hospitalization; Medication Management     CCA Biopsychosocial Patient Reported Schizophrenia/Schizoaffective Diagnosis in Past: No   Strengths: Unknown   Mental Health Symptoms Depression:   Irritability; Fatigue   Duration of Depressive symptoms:  Duration of Depressive Symptoms: Less than two weeks   Mania:   None   Anxiety:    Irritability   Psychosis:   Hallucinations   Duration of Psychotic symptoms:  Duration of Psychotic Symptoms: Less than six months  Trauma:   None   Obsessions:   None   Compulsions:   None   Inattention:   None   Hyperactivity/Impulsivity:   None   Oppositional/Defiant Behaviors:  No data recorded  Emotional Irregularity:   None   Other Mood/Personality Symptoms:   N/A    Mental Status Exam Appearance and self-care  Stature:   Average   Weight:   Average weight   Clothing:   -- (Hospital scrubs)   Grooming:   Normal   Cosmetic use:   None   Posture/gait:   Normal   Motor activity:   Agitated   Sensorium  Attention:   Distractible   Concentration:   Scattered   Orientation:   X5   Recall/memory:   Normal   Affect and Mood  Affect:   Anxious   Mood:   Irritable   Relating  Eye contact:   Normal   Facial expression:   Responsive   Attitude toward  examiner:   Irritable   Thought and Language  Speech flow:  Loud   Thought content:   Illusions   Preoccupation:   Other (Comment)   Hallucinations:   Auditory   Organization:   Disorganized   Company secretaryxecutive Functions  Fund of Knowledge:   Average   Intelligence:   Average   Abstraction:   Functional   Judgement:   Impaired   Reality Testing:   Distorted   Insight:   Lacking   Decision Making:   Normal   Social Functioning  Social Maturity:   Irresponsible   Social Judgement:   Normal   Stress  Stressors:   Other (Comment) (Substance use)   Coping Ability:   Deficient supports   Skill Deficits:   None   Supports:   Support needed     Religion: Religion/Spirituality Are You A Religious Person?:  ("Rather not say") How Might This Affect Treatment?: N/A  Leisure/Recreation:    Exercise/Diet: Exercise/Diet Do You Exercise?:  (Unknown) Have You Gained or Lost A Significant Amount of Weight in the Past Six Months?:  (Unknown) Do You Follow a Special Diet?:  (Unknown) Do You Have Any Trouble Sleeping?: Yes Explanation of Sleeping Difficulties: Pt reports he has been awake for 3 days   CCA Employment/Education Employment/Work Situation: Employment / Work Situation Employment Situation: On disability Why is Patient on Disability: Due to mental health diagnosis How Long has Patient Been on Disability: Unknown Patient's Job has Been Impacted by Current Illness: No Has Patient ever Been in the [employer]?: No  Education: Education Is Patient Currently Attending School?: No Last Grade Completed:  (Unknown) Did You Attend College?: No Did You Have An Individualized Education Program (IIEP): No Did You Have Any Difficulty At School?: No Patient's Education Has Been Impacted by Current Illness: No   CCA Family/Childhood History Family and Relationship History: Family history Marital status: Single Does patient have children?:  No  Childhood History:  Childhood History By whom was/is the patient raised?: Mother Did patient suffer any verbal/emotional/physical/sexual abuse as a child?: No Did patient suffer from severe childhood neglect?: No Has patient ever been sexually abused/assaulted/raped as an adolescent or adult?: No Was the patient ever a victim of a crime or a disaster?: No Witnessed domestic violence?: No Has patient been affected by domestic violence as an adult?: No       CCA Substance Use Alcohol/Drug Use: Alcohol / Drug Use Pain Medications: See MAR Prescriptions: See MAR Over the Counter: See MAR History of alcohol /  drug use?: Yes Longest period of sobriety (when/how long): [place] Negative Consequences of Use:  (Unknown) Withdrawal Symptoms: Sweats Substance #1 Name of Substance 1: Alcohol 1 - Age of First Use: 14 1 - Amount (size/oz): Unknown specific amount 1 - Frequency: Daily 1 - Duration: Ongoing 1 - Last Use / Amount: Today 1 - Method of Aquiring: Self 1- Route of Use: Oral Substance #2 Name of Substance 2: Amphetamines 2 - Age of First Use: 22S 2 - Amount (size/oz): 3 percocets 2 - Frequency: Daily 2 - Duration: Ongoing 2 - Last Use / Amount: Today 2 - Method of Aquiring: Unknown 2 - Route of Substance Use: Oral                     ASAM's:  Six Dimensions of Multidimensional Assessment  Dimension 1:  Acute Intoxication and/or Withdrawal Potential:   Dimension 1:  Description of individual's past and current experiences of substance use and withdrawal: Pt reports wirthdrawl symptom to include sweating  Dimension 2:  Biomedical Conditions and Complications:   Dimension 2:  Description of patient's biomedical conditions and  complications: No known complications directly related to substance use  Dimension 3:  Emotional, Behavioral, or Cognitive Conditions and Complications:  Dimension 3:  Description of emotional, behavioral, or cognitive conditions and  complications: Pt experiencing AH  Dimension 4:  Readiness to Change:  Dimension 4:  Description of Readiness to Change criteria: Pt reports he would like to stop substance use  Dimension 5:  Relapse, Continued use, or Continued Problem Potential:  Dimension 5:  Relapse, continued use, or continued problem potential critiera description: Pt reports substance use since being a child. No reported past treatment  Dimension 6:  Recovery/Living Environment:  Dimension 6:  Recovery/Iiving environment criteria description: Pt reports he is homeless  ASAM Severity Score: ASAM's Severity Rating Score: 8  ASAM Recommended Level of Treatment: ASAM Recommended Level of Treatment: Level II Partial Hospitalization Treatment   Substance use Disorder (SUD) Substance Use Disorder (SUD)  Checklist Symptoms of Substance Use: Continued use despite having a persistent/recurrent physical/psychological problem caused/exacerbated by use, Continued use despite persistent or recurrent social, interpersonal problems, caused or exacerbated by use, Presence of craving or strong urge to use  Recommendations for Services/Supports/Treatments: Recommendations for Services/Supports/Treatments Recommendations For Services/Supports/Treatments: Partial Hospitalization, Inpatient Hospitalization, Individual Therapy, Medication Management  Discharge Disposition:    DSM5 Diagnoses: Patient Active Problem List   Diagnosis Date Noted   Substance induced mood disorder (HCC) 03/31/2019   Cocaine abuse with cocaine-induced mood disorder (HCC) 04/08/2014   Alcohol abuse, episodic 04/07/2014   Alcohol dependence with alcohol-induced mood disorder (HCC)    Anxiety state, unspecified 08/31/2013   Schizoaffective disorder, bipolar type (HCC) 08/30/2013   Alcohol-induced mood disorder (HCC) 08/29/2013   Diarrhea 01/30/2013   Abdominal pain 12/18/2012   Compulsive tobacco user syndrome 09/20/2012     Referrals to Alternative  Service(s): Referred to Alternative Service(s):   Place:   Date:   Time:    Referred to Alternative Service(s):   Place:   Date:   Time:    Referred to Alternative Service(s):   Place:   Date:   Time:    Referred to Alternative Service(s):   Place:   Date:   Time:     Cleda Clarks, Theresia Majors

## 2022-01-23 NOTE — ED Notes (Signed)
Pt is now back in his room at this time. While ambulating to his room he continues to yell and use foul language and is threatening security at this time. Pt is now back in his room and continues to yell and use foul language

## 2022-01-23 NOTE — ED Notes (Signed)
IVC paperwork completed. At [facility] nursing station.

## 2022-01-23 NOTE — ED Notes (Signed)
Entered the room to introduce myself and was advised by pt that I have 15 min to do what I needed to do and he was checking out of here. That he has been here for so long that we haven't did anything for him and that he has been up for 3 days. Pt was advised at this time my name and that we just met. Pt stated that it was nothing against me

## 2022-01-23 NOTE — ED Notes (Signed)
Pt is currently refusing to answer any questions. Pt will not even answer question to identify him self.

## 2022-01-23 NOTE — ED Notes (Signed)
Pt remains at the nurses station yelling about leaving that he has money and he can go to a hotel. Security arrives at this time and pt starts to talk about he will take them on. Continues to use a loud voice and foul language. Attempting to get pt back to his room

## 2022-01-23 NOTE — Progress Notes (Signed)
CSW was contacted by Kriste Basque with [facility] regarding reviewing this patient for placement. Becky requested HMP and assessment for further review.   Crissie Reese, MSW, Lenice Pressman Phone: [phone] Disposition/TOC

## 2022-01-23 NOTE — Progress Notes (Signed)
Per Roselyn Bering, NP, patient meets criteria for inpatient treatment. There are no available beds at [facility] today. CSW faxed referrals to the following facilities for review:  [facility]  Pending - Request Sent N/A [address], [place] [address] [phone] --  [facility]  Pending - Request Sent N/A [address] [place] [address] [phone] --  [facility]  Pending - Request Sent N/A [address] [place] [address] [phone] --  [facility]  Pending - Request Sent N/A [address] [place] [address] [phone] --  [facility]  Pending - Request Sent N/A [address] [place], [place] [address] [phone] --  [facility]  Pending - Request Sent N/A [address] [place] [address] [phone] --  [facility]  Pending - Request Sent N/A [address] [place] [address] [phone] --  [facility]  Pending - Request Sent N/A [address], [place] [address] [phone] --  [facility]  Pending - Request Sent N/A [address] [place] [address] [phone] --  [facility]  Pending - Request Sent N/A [address], [place] [address] [phone] --  [facility]  Pending - Request Sent N/A [address] [place] [address] [phone] --  [facility]  Pending - Request Sent N/A [address], [place] [address] [phone] --  [facility]  Pending - Request Sent N/A [address], [place] [address] [phone] --  [facility]  Pending - Request Sent N/A [address]  [place] [address] [phone] --   TTS will continue to seek bed placement.  Crissie Reese, MSW, Lenice Pressman Phone: [phone] Disposition/TOC

## 2022-01-23 NOTE — ED Notes (Signed)
Sitter remains at beside. No needs from pt at this time.

## 2022-01-23 NOTE — ED Notes (Signed)
Received verbal report from Chris C RN at this time ?

## 2022-01-23 NOTE — ED Notes (Signed)
The pt keeps talking nonsense  he is talking to people  that are not there  many trips to the bathroom

## 2022-01-24 MED ORDER — OLANZAPINE 5 MG PO TABS: 5.0000 mg | ORAL_TABLET | Freq: Every day | ORAL | 0 refills | Status: DC

## 2022-01-24 MED ORDER — HYDROXYZINE HCL 25 MG PO TABS: 25.0000 mg | ORAL_TABLET | Freq: Three times a day (TID) | ORAL | 0 refills | Status: DC | PRN

## 2022-01-24 NOTE — ED Notes (Signed)
RN provided pt a bus pass. RN discussed facility options and NP provided prescriptions. Pt verbalized understanding and no questions at this time.

## 2022-01-24 NOTE — Discharge Summary (Signed)
[name] Psych ED Discharge  01/24/2022 9:39 AM [name]  MRN:  [id number]  Principal Problem: Substance-induced psychotic disorder [facility]) Discharge Diagnoses: Principal Problem:   Substance-induced psychotic disorder (HCC) Active Problems:   Alcohol-induced mood disorder (HCC)   Schizoaffective disorder, bipolar type (HCC)  Clinical Impression:  Final diagnoses:  Suicidal ideations   Subjective: "I feel better today"  ED Assessment Time Calculation: Start Time: 0830 Stop Time: 0855 Total Time in Minutes (Assessment Completion): 25   [name] is a 51 year old, male, with mental health history significant for Schizoaffective Disorder, Bipolar Disorder,Polysubstance Use, Alcohol Use Disorder, SI, HI, under IVC petition, re-evaluated per TTS consult due to psychotic symptoms, substance intoxication, and suicidal ideations.    Prior to EMS transporting him here to [facility] including percocet, shots of liquor, beer, and patient admitted to this writer use of other pills including the percocet. Patient required IM agitation medications this morning (given Ziprasidone IM 20 mg) after attempting to leave and yelling at staff. Patient has not exhibited any inappropriate behaviors since this around 0300 AM this morning.  Patient denies history of seizures related to substance or alcohol withdrawal. Patient did experience withdrawal tremors yesterday, added clonidine , Zyprexa, in addition to ativan protocol. Patient today denies any withdrawal symptoms and is in agreement to receive outpatient substance abuse treatment resources. He continues to denies SI/HI/AH/VH.  During evaluation [name] is sitting upright on bed  in no acute distress. He is alert, oriented x 4, calm, cooperative and attentive.  His mood is euthymic with congruent affect.  He has normal speech, and behavior.  Objectively there is no evidence of psychosis/mania or delusional thinking.  Patient is able to converse  coherently, goal directed thoughts, no distractibility, or pre-occupation. He also denies suicidal/self-harm/homicidal ideation, psychosis, and paranoia.  Patient answered question appropriately.   Patient is able to contract for safety and no longer meets inpatient psychiatric criteria.      Past Medical History:  Past Medical History:  Diagnosis Date   Anxiety    Bipolar 1 disorder (HCC)    Stroke [facility])     Past Surgical History:  Procedure Laterality Date   CHOLECYSTECTOMY N/A 12/24/2012   Procedure: LAPAROSCOPIC CHOLECYSTECTOMY;  Surgeon: Cherylynn Ridges, MD;  Location: [facility] OR;  Service: General;  Laterality: N/A;   FINGER SURGERY     Family History:  Family History  Problem Relation Age of Onset   Heart disease Mother    Social History:  Social History   Substance and Sexual Activity  Alcohol Use Yes   Alcohol/week: 20.0 standard drinks of alcohol   Types: 20 Cans of beer per week   Comment: BAC was clear     Social History   Substance and Sexual Activity  Drug Use No   Types: Marijuana   Comment: UDS was clear    Social History   Socioeconomic History   Marital status: Single    Spouse name: Not on file   Number of children: Not on file   Years of education: Not on file   Highest education level: Not on file  Occupational History   Not on file  Tobacco Use   Smoking status: Every Day    Packs/day: 1.00    Years: 5.00    Total pack years: 5.00    Types: Cigarettes   Smokeless tobacco: Never  Substance and Sexual Activity   Alcohol use: Yes    Alcohol/week: 20.0 standard drinks of alcohol  Types: 20 Cans of beer per week    Comment: BAC was clear   Drug use: No    Types: Marijuana    Comment: UDS was clear   Sexual activity: Never    Birth control/protection: Condom  Other Topics Concern   Not on file  Social History Narrative   Not on file   Social Determinants of Health   Financial Resource Strain: Not on file  Food Insecurity: Not on file   Transportation Needs: Not on file  Physical Activity: Not on file  Stress: Not on file  Social Connections: Not on file    Tobacco Cessation:  A prescription for an FDA-approved tobacco cessation medication was offered at discharge and the patient refused  Current Medications: Current Facility-Administered Medications  Medication Dose Route Frequency Provider Last Rate Last Admin   cloNIDine (CATAPRES) tablet 0.1 mg  0.1 mg Oral TID Bing Neighbors, FNP   0.1 mg at 01/23/22 1856   hydrOXYzine (ATARAX) tablet 25 mg  25 mg Oral TID PRN Bing Neighbors, FNP       LORazepam (ATIVAN) injection 0-4 mg  0-4 mg Intravenous Q6H Ali, Amjad, PA-C       Or   LORazepam (ATIVAN) tablet 0-4 mg  0-4 mg Oral Q6H Ali, Amjad, PA-C       [START ON 01/25/2022] LORazepam (ATIVAN) injection 0-4 mg  0-4 mg Intravenous Q12H Ali, Amjad, PA-C       Or   [START ON 01/25/2022] LORazepam (ATIVAN) tablet 0-4 mg  0-4 mg Oral Q12H Ali, Amjad, PA-C       OLANZapine (ZYPREXA) tablet 5 mg  5 mg Oral QHS Bing Neighbors, FNP       Current Outpatient Medications  Medication Sig Dispense Refill   hydrOXYzine (ATARAX) 25 MG tablet Take 1 tablet (25 mg total) by mouth 3 (three) times daily as needed for anxiety. 30 tablet 0   mesalamine (CANASA) 1000 MG suppository Place 1 suppository (1,000 mg total) rectally at bedtime. (Patient not taking: Reported on 01/23/2022) 7 suppository 12   OLANZapine (ZYPREXA) 5 MG tablet Take 1 tablet (5 mg total) by mouth at bedtime. 30 tablet 0   PTA Medications: (Not in a hospital admission)   [place] Scale:  Flowsheet Row ED from 01/22/2022 in [facility] EMERGENCY DEPARTMENT ED from 08/11/2021 in [facility]PT ED from 07/09/2021 in [facility]  C-SSRS RISK CATEGORY No Risk No Risk No Risk      Psychiatric Specialty Exam: Presentation  General Appearance:  Disheveled  Eye  Contact: Fair  Speech: Clear and Coherent  Speech Volume: Normal  Handedness: Right   Mood and Affect  Mood: Euthymic  Affect: Appropriate   Thought Process  Thought Processes: Coherent  Descriptions of Associations:Intact  Orientation:Full (Time, Place and Person)  Thought Content:Logical  History of Schizophrenia/Schizoaffective disorder:Yes  Duration of Psychotic Symptoms:Less than six months  Hallucinations:Hallucinations: None  Ideas of Reference:None  Suicidal Thoughts:Suicidal Thoughts: No  Homicidal Thoughts:Homicidal Thoughts: No   Sensorium  Memory: Immediate Good; Recent Good; Remote Good  Judgment: Intact  Insight: Fair   Chartered certified accountant: Fair  Attention Span: Fair  Recall: [employer] of Knowledge: Fair  Language: Fair   Psychomotor Activity  Psychomotor Activity: Psychomotor Activity: Normal   Assets  Assets: Desire for Improvement; Communication Skills   Sleep  Sleep: Sleep: Good (endorses improved sleep while here at hospital)    Physical Exam:  Physical Exam Vitals reviewed.  HENT:     Head: Normocephalic.     Right Ear: External ear normal.     Left Ear: External ear normal.  Eyes:     Extraocular Movements: Extraocular movements intact.     Pupils: Pupils are equal, round, and reactive to light.  Cardiovascular:     Rate and Rhythm: Normal rate and regular rhythm.  Pulmonary:     Effort: Pulmonary effort is normal.  Musculoskeletal:     Cervical back: Normal range of motion.  Neurological:     General: No focal deficit present.     Mental Status: He is alert.      Review of Systems  Psychiatric/Behavioral:  Positive for substance abuse    Blood pressure 94/71, pulse 80, temperature 98.4 F (36.9 C), temperature source Oral, resp. rate 20, SpO2 95 %. There is no height or weight on file to calculate BMI.    Follow-up Information     [facility]. Go to.   Specialty: Urgent Care Why: for any urgent mental health needs. For outpatient substance treatment, follow-up Monday-Friday at the walk in clinic at 7:30 am. The outpatient clinic is first come/first serve. Contact information: [phone] [place] [address] [phone]                Plan Of Care/Follow-up recommendations:  Other:  [facility] outpatient follow-up recommended. Resources for outpatient and residential substance treatment facilities included with discharge instructions.  Medical Decision Making: Patient case discussed and reviewed with Dr. Lucianne Muss, patient is able to contract for safety. Patient does not meet inpatient criteria for psychiatric treatment. EDP, RN, LCSW notifed.    Disposition: Eloped  Joaquin Courts, FNP-C, PMHNP-BC 01/24/2022, 9:39 AM

## 2022-01-24 NOTE — Discharge Instructions (Addendum)
I have included a resources for inpatient and outpatient substance treatment facilities.  You have been provided a 30 day supply of Zyprexa to stabilize your mood and hydroxyzine for anxiety and sleep.  Please follow-up at [facility] to establish care for ongoing mental health services:  [address] [place], [place] [address]  Therapy Walk-in Hours  Monday-Wednesday: 8 AM until slots are full  Friday: 1 PM to 5 PM  For Monday to Wednesday, it is recommended that patients arrive between 7:30 AM and 7:45 AM because patients will be seen in the order of arrival.  For Friday, we ask that patients arrive between 12 PM to 12:30 PM.  Go to the second floor on arrival and check in.  Availability is limited; therefore, patients may not be seen on the same day.  Medication management walk-ins:  Monday to Friday: 8 AM to 11 AM.  It is recommended that patients arrive by 7:30 AM to 7:45 AM because patients will be seen in the order of arrival.  Go to the second floor on arrival and check in. Availability is limited; therefore, patients may not be seen on the same day.

## 2022-01-24 NOTE — ED Provider Notes (Signed)
  Physical Exam  BP 94/71 (BP Location: Left Arm)   Pulse 80   Temp 98.4 F (36.9 C) (Oral)   Resp 20   SpO2 95%   Physical Exam  Procedures  Procedures  ED Course / MDM    Medical Decision Making Amount and/or Complexity of Data Reviewed Labs: ordered.  Risk Prescription drug management.   Under IVC.  Alcohol with some withdrawal.  Pending placement.  Sleeping comfortably at this time.       Benjiman Core, MD 01/24/22 [phone]

## 2022-01-24 NOTE — ED Notes (Signed)
RN asked pt if he would like to eat his breakfast. Pt declined at this time.

## 2022-01-24 NOTE — ED Notes (Signed)
Pt was provided 3 bags and items from security. Pt signed paperwork.

## 2022-01-24 NOTE — ED Notes (Signed)
Faxed Rescind paperwork to [employer].

## 2022-04-08 ENCOUNTER — Ambulatory Visit (HOSPITAL_COMMUNITY)
Admission: EM | Admit: 2022-04-08 | Discharge: 2022-04-09 | Disposition: A | Discharge status: Home / Self Care | Payer: Medicaid Other | Attending: Psychiatry | Admitting: Psychiatry

## 2022-04-08 DIAGNOSIS — F250 Schizoaffective disorder, bipolar type: Secondary | ICD-10-CM | POA: Diagnosis not present

## 2022-04-08 DIAGNOSIS — F1110 Opioid abuse, uncomplicated: Secondary | ICD-10-CM | POA: Diagnosis not present

## 2022-04-08 DIAGNOSIS — Z91148 Patient's other noncompliance with medication regimen for other reason: Secondary | ICD-10-CM | POA: Diagnosis not present

## 2022-04-08 DIAGNOSIS — Z1152 Encounter for screening for COVID-19: Secondary | ICD-10-CM | POA: Insufficient documentation

## 2022-04-08 DIAGNOSIS — R45851 Suicidal ideations: Secondary | ICD-10-CM | POA: Insufficient documentation

## 2022-04-08 LAB — POCT URINE DRUG SCREEN - MANUAL ENTRY (I-SCREEN)
POC Amphetamine UR: NOT DETECTED
POC Buprenorphine (BUP): NOT DETECTED
POC Cocaine UR: NOT DETECTED
POC Marijuana UR: NOT DETECTED
POC Methadone UR: NOT DETECTED
POC Methamphetamine UR: NOT DETECTED
POC Morphine: NOT DETECTED
POC Oxazepam (BZO): NOT DETECTED
POC Oxycodone UR: NOT DETECTED
POC Secobarbital (BAR): NOT DETECTED

## 2022-04-08 LAB — CBC WITH DIFFERENTIAL/PLATELET
Abs Immature Granulocytes: 0.03 10*3/uL (ref 0.00–0.07)
Basophils Absolute: 0 10*3/uL (ref 0.0–0.1)
Basophils Relative: 0 %
Eosinophils Absolute: 0 10*3/uL (ref 0.0–0.5)
Eosinophils Relative: 0 %
HCT: 46.2 % (ref 39.0–52.0)
Hemoglobin: 16.4 g/dL (ref 13.0–17.0)
Immature Granulocytes: 0 %
Lymphocytes Relative: 23 %
Lymphs Abs: 2.4 10*3/uL (ref 0.7–4.0)
MCH: 33.3 pg (ref 26.0–34.0)
MCHC: 35.5 g/dL (ref 30.0–36.0)
MCV: 93.9 fL (ref 80.0–100.0)
Monocytes Absolute: 0.8 10*3/uL (ref 0.1–1.0)
Monocytes Relative: 7 %
Neutro Abs: 7.3 10*3/uL (ref 1.7–7.7)
Neutrophils Relative %: 70 %
Platelets: 245 10*3/uL (ref 150–400)
RBC: 4.92 MIL/uL (ref 4.22–5.81)
RDW: 12.6 % (ref 11.5–15.5)
WBC: 10.5 10*3/uL (ref 4.0–10.5)
nRBC: 0 % (ref 0.0–0.2)

## 2022-04-08 LAB — RESP PANEL BY RT-PCR (RSV, FLU A&B, COVID)  RVPGX2
Influenza A by PCR: NEGATIVE
Influenza B by PCR: NEGATIVE
Resp Syncytial Virus by PCR: NEGATIVE
SARS Coronavirus 2 by RT PCR: NEGATIVE

## 2022-04-08 LAB — ETHANOL: Alcohol, Ethyl (B): <10 mg/dL (ref <10)

## 2022-04-08 LAB — POC SARS CORONAVIRUS 2 AG: SARSCOV2ONAVIRUS 2 AG: NEGATIVE

## 2022-04-08 MED ORDER — ALUM & MAG HYDROXIDE-SIMETH 200-200-20 MG/5ML PO SUSP: 30.0000 mL | ORAL | Status: DC | PRN

## 2022-04-08 MED ORDER — HYDROXYZINE HCL 25 MG PO TABS
25.0000 mg | ORAL_TABLET | Freq: Three times a day (TID) | ORAL | Status: DC | PRN
  Administered 2022-04-08: 25 mg via ORAL
  Filled 2022-04-08: qty 1

## 2022-04-08 MED ORDER — LORAZEPAM 1 MG PO TABS: 1.0000 mg | ORAL_TABLET | ORAL | Status: DC | PRN

## 2022-04-08 MED ORDER — MAGNESIUM HYDROXIDE 400 MG/5ML PO SUSP: 30.0000 mL | Freq: Every day | ORAL | Status: DC | PRN

## 2022-04-08 MED ORDER — ZIPRASIDONE MESYLATE 20 MG IM SOLR: 20.0000 mg | INTRAMUSCULAR | Status: DC | PRN

## 2022-04-08 MED ORDER — ACETAMINOPHEN 325 MG PO TABS: 650.0000 mg | ORAL_TABLET | Freq: Four times a day (QID) | ORAL | Status: DC | PRN

## 2022-04-08 MED ORDER — MELATONIN 5 MG PO TABS
5.0000 mg | ORAL_TABLET | Freq: Every evening | ORAL | Status: DC | PRN
  Administered 2022-04-08: 5 mg via ORAL
  Filled 2022-04-08: qty 1

## 2022-04-08 MED ORDER — OLANZAPINE 10 MG PO TBDP: 10.0000 mg | ORAL_TABLET | Freq: Three times a day (TID) | ORAL | Status: DC | PRN

## 2022-04-08 NOTE — BH Assessment (Signed)
Comprehensive Clinical Assessment (CCA) Note  04/08/2022 [name] [id number]  Disposition: Jeffery Georges, NP recommends inpatient psychiatric admission.  The patient demonstrates the following risk factors for suicide: Chronic risk factors for suicide include: substance use disorder. Acute risk factors for suicide include: N/A. Protective factors for this patient include: hope for the future. Considering these factors, the overall suicide risk at this point appears to be low. Patient is not appropriate for outpatient follow up.  Patient is a 52 y.o. single male who presents to [facility] via [employer]. Patient reports he called police to have him brought in, because he needs to get back on his medications. Patient reports passive SI, with no plan or intent. Patient reports he has not been on medications in more than 6 months. He states that he had an ACT Team with [facility], however he fired them due to them being "money hungry." Patient is currently not receiving any mental health services. Patient endorses AH of voices "saying all kinds of stuff," and states he has spirits around him. Patient denies HI or visual hallucinations. Patient reports her smokes crack, uses opioids and drinks beer. He states he last used crack 2-3 days ago, pills the other day and had beer yesterday. Patient's UDS is negative.  Patient reports he lives with his mother. He denies access to guns or weapons. He was dressed casually, alert and oriented. Patient has normal speech and he is cooperative throughout the assessment. Patient reports he is motivated to seek treatment for his mental health and alcohol use at this time.   Chief Complaint:  Chief Complaint  Patient presents with   Psychiatric Evaluation   Visit Diagnosis:   Medication non-compliant Opioid abuse ([name]) Suicidal thoughts  CCA Screening, Triage and Referral (STR)  Patient Reported Information How did you hear about [place]? Legal  System  What Is the Reason for Your Visit/Call Today? Patient presents voluntarily, accompanied by [employer] stating he needs to get back on his medications. Patient reports a history of bipolar, schizophrenia, anxiety and depression. Paitent also reports passive SI. Patient denies HI, auditory or visual hallucinations.  How Long Has This Been Causing You Problems? 1-6 months  What Do You Feel Would Help You the Most Today? Treatment for Depression or other mood problem; Alcohol or Drug Use Treatment   Have You Recently Had Any Thoughts About Hurting Yourself? Yes  Are You Planning to Commit Suicide/Harm Yourself At This time? No   Flowsheet Row ED from 04/08/2022 in [facility] ED from 01/22/2022 in [facility] Emergency Department at [facility] ED from 08/11/2021 in [facility] Emergency Department at [place] No Risk No Risk       Have you Recently Had Thoughts About [place]? No  Are You Planning to Harm Someone at This Time? No  Explanation: N/A   Have You Used Any Alcohol or Drugs in the Past 24 Hours? Yes  What Did You Use and How Much? Patient reports he drank 2 beers yesterday.   Do You Currently Have a Therapist/Psychiatrist? No  Name of Therapist/Psychiatrist: Name of Therapist/Psychiatrist: N/A   Have You Been Recently Discharged From Any Office Practice or Programs? No  Explanation of Discharge From Practice/Program: N/A     CCA Screening Triage Referral Assessment Type of Contact: Face-to-Face  Telemedicine Service Delivery:   Is this Initial or Reassessment?   Date Telepsych consult ordered in [facility]:  Time Telepsych consult ordered in [facility]:    Location of Assessment: [facility] Assessment Services  Provider Location: [facility] Assessment Services   Collateral Involvement: None   Does Patient Have a Stage manager Guardian? No  Legal Guardian Contact  Information: N/A  Copy of Legal Guardianship Form: -- (N/A)  Legal Guardian Notified of Arrival: -- (N/A)  Legal Guardian Notified of Pending Discharge: -- (N/A)  If Minor and Not Living with Parent(s), Who has Custody? N/A  Is CPS involved or ever been involved? Never  Is APS involved or ever been involved? Never   Patient Determined To Be At Risk for Harm To Self or Others Based on Review of Patient Reported Information or Presenting Complaint? No  Method: No Plan  Availability of Means: No access or NA  Intent: Vague intent or NA  Notification Required: No need or identified person  Additional Information for Danger to Others Potential: -- (N/A)  Additional Comments for Danger to Others Potential: N/A  Are There Guns or Other Weapons in Your Home? No  Types of Guns/Weapons: N/A  Are These Weapons Safely Secured?                            -- (N/A)  Who Could Verify You Are Able To Have These Secured: N/A  Do You Have any Outstanding Charges, Pending Court Dates, Parole/Probation? No  Contacted To Inform of Risk of Harm To Self or Others: -- (N/A)    Does Patient Present under Involuntary Commitment? No    [place] of Residence: [name]   Patient Currently Receiving the Following Services: Not Receiving Services   Determination of Need: Urgent (48 hours)   Options For Referral: Inpatient Hospitalization     CCA Biopsychosocial Patient Reported Schizophrenia/Schizoaffective Diagnosis in Past: Yes   Strengths: Motivated to receive help.   Mental Health Symptoms Depression:   None   Duration of Depressive symptoms:    Mania:   None   Anxiety:    None   Psychosis:   Hallucinations   Duration of Psychotic symptoms:    Trauma:   None   Obsessions:   None   Compulsions:   None   Inattention:   None   Hyperactivity/Impulsivity:   None   Oppositional/Defiant Behaviors:  None   Emotional Irregularity:   None   Other  Mood/Personality Symptoms:   N/A    Mental Status Exam Appearance and self-care  Stature:   Tall   Weight:   Average weight   Clothing:   Disheveled   Grooming:   Neglected   Cosmetic use:   None   Posture/gait:   Normal   Motor activity:   Not Remarkable   Sensorium  Attention:   Normal   Concentration:   Scattered   Orientation:   X5   Recall/memory:   Normal   Affect and Mood  Affect:   Full Range   Mood:   Euthymic   Relating  Eye contact:   Normal   Facial expression:   Responsive   Attitude toward examiner:   Cooperative   Thought and Language  Speech flow:  Normal   Thought content:   Appropriate to Mood and Circumstances   Preoccupation:   None   Hallucinations:   Auditory   Organization:   Engineer, building services of Knowledge:   Average   Intelligence:   Average   Abstraction:   Functional  Judgement:   Fair   Art therapist:   Adequate   Insight:   Fair   Decision Making:   Normal   Social Functioning  Social Maturity:   Isolates   Social Judgement:   Normal   Stress  Stressors:   Other (Comment) (Substance use)   Coping Ability:   Deficient supports   Skill Deficits:   None   Supports:   Support needed     Religion: Religion/Spirituality Are You A Religious Person?: No How Might This Affect Treatment?: N/A  Leisure/Recreation: Leisure / Recreation Do You Have Hobbies?: No  Exercise/Diet: Exercise/Diet Do You Exercise?: No Have You Gained or Lost A Significant Amount of Weight in the Past Six Months?: No Do You Follow a Special Diet?: No Do You Have Any Trouble Sleeping?: No   CCA Employment/Education Employment/Work Situation: Employment / Work Technical sales engineer: On disability Why is Patient on Disability: Due to mental health How Long has Patient Been on Disability: Unknown Patient's Job has Been Impacted by Current Illness: No Has Patient  ever Been in the [employer]?: No  Education: Education Is Patient Currently Attending School?: No Last Grade Completed: 10 Did You Attend College?: No Did You Have An Individualized Education Program (IIEP): No Did You Have Any Difficulty At School?: No Patient's Education Has Been Impacted by Current Illness: No   CCA Family/Childhood History Family and Relationship History: Family history Marital status: Single Does patient have children?: No  Childhood History:  Childhood History By whom was/is the patient raised?: Mother Did patient suffer any verbal/emotional/physical/sexual abuse as a child?: No Did patient suffer from severe childhood neglect?: No Has patient ever been sexually abused/assaulted/raped as an adolescent or adult?: No Was the patient ever a victim of a crime or a disaster?: No Witnessed domestic violence?: No Has patient been affected by domestic violence as an adult?: No       CCA Substance Use Alcohol/Drug Use: Alcohol / Drug Use Pain Medications: See MAR Prescriptions: See MAR Over the Counter: See MAR History of alcohol / drug use?: Yes Longest period of sobriety (when/how long): Unknown Negative Consequences of Use:  (N/A) Withdrawal Symptoms: Sweats Substance #1 Name of Substance 1: Alcohol 1 - Age of First Use: 14 1 - Amount (size/oz): Varies 1 - Frequency: Daily 1 - Duration: Ongoing 1 - Last Use / Amount: Yesterday 1 - Method of Aquiring: Purchase 1- Route of Use: Orally Substance #2 Name of Substance 2: Crack 2 - Age of First Use: 20's 2 - Amount (size/oz): Varies 2 - Frequency: Every few days 2 - Duration: Ongoing 2 - Last Use / Amount: 2-3 days ago 2 - Method of Aquiring: Street 2 - Route of Substance Use: Smokes                     ASAM's:  Six Dimensions of Multidimensional Assessment  Dimension 1:  Acute Intoxication and/or Withdrawal Potential:   Dimension 1:  Description of individual's past and current  experiences of substance use and withdrawal: Reports sweating, no current symptoms  Dimension 2:  Biomedical Conditions and Complications:   Dimension 2:  Description of patient's biomedical conditions and  complications: No known complications directly related to substance use  Dimension 3:  Emotional, Behavioral, or Cognitive Conditions and Complications:  Dimension 3:  Description of emotional, behavioral, or cognitive conditions and complications: Pt experiencing AH  Dimension 4:  Readiness to Change:  Dimension 4:  Description of Readiness to Change criteria:  Pt reports he would like to stop substance use  Dimension 5:  Relapse, Continued use, or Continued Problem Potential:  Dimension 5:  Relapse, continued use, or continued problem potential critiera description: Pt reports substance use since being a child. No reported past treatment to prevent relapse  Dimension 6:  Recovery/Living Environment:  Dimension 6:  Recovery/Iiving environment criteria description: Patient reports he lives with his mother  ASAM Severity Score: ASAM's Severity Rating Score: 6  ASAM Recommended Level of Treatment: ASAM Recommended Level of Treatment: Level II Intensive Outpatient Treatment   Substance use Disorder (SUD) Substance Use Disorder (SUD)  Checklist Symptoms of Substance Use: Continued use despite having a persistent/recurrent physical/psychological problem caused/exacerbated by use, Continued use despite persistent or recurrent social, interpersonal problems, caused or exacerbated by use, Presence of craving or strong urge to use, Persistent desire or unsuccessful efforts to cut down or control use  Recommendations for Services/Supports/Treatments: Recommendations for Services/Supports/Treatments Recommendations For Services/Supports/Treatments: IOP (Intensive Outpatient Program), Individual Therapy, Partial Hospitalization, SAIOP (Substance Abuse Intensive Outpatient Program)  Discharge Disposition:     DSM5 Diagnoses: Patient Active Problem List   Diagnosis Date Noted   Substance-induced psychotic disorder ([employer]) 01/23/2022   Substance induced mood disorder ([place]) 03/31/2019   Cocaine abuse with cocaine-induced mood disorder ([place]) 04/08/2014   Alcohol abuse, episodic 04/07/2014   Alcohol dependence with alcohol-induced mood disorder ([place])    Anxiety state, unspecified 08/31/2013   Schizoaffective disorder, bipolar type ([place]) 08/30/2013   Alcohol-induced mood disorder ([place]) 08/29/2013   Diarrhea 01/30/2013   Abdominal pain 12/18/2012   Compulsive tobacco user syndrome 09/20/2012     Referrals to Alternative Service(s): Referred to Alternative Service(s):   Place:   Date:   Time:    Referred to Alternative Service(s):   Place:   Date:   Time:    Referred to Alternative Service(s):   Place:   Date:   Time:    Referred to Alternative Service(s):   Place:   Date:   Time:     Waylan Boga, LCSW

## 2022-04-08 NOTE — ED Provider Notes (Signed)
[facility] Continuous Assessment Admission H&P  Date: 04/08/22 Patient Name: [name] MRN: [id number] Chief Complaint: medication restart  Diagnoses:  Final diagnoses:  H/O medication noncompliance  Suicidal thoughts  Opioid abuse [facility])    HPI: [name],  52 y/o male with a history of suicidal ideation schizoaffective disorder depression, hallucination, adjustment disorder, bipolar disorder medication noncompliance.  Presented to [facility] via [facility] voluntarily.  Per the patient he needs to get back on his medications last time he took them was probably 6 to 8 months ago.  According to patient he had an ACT team at 1 point but per the patient ACT team through [facility] according to the patient he had to fired them because they were money hungry.  According to the patient he is currently not seeing a psychiatrist or therapist.  Per the patient he lives with his mother, patient is unemployed.  According to patient he drinks alcohol 2 days ago, patient reported he does cocaine/pills which include oxycodone and hydrocodone every other day.  Face-to-face observation of patient, patient is alert and oriented x 4, speech is clear maintain minimal eye contact.  Patient is very cooperative and answers questions appropriately.  Patient is calm and is demeanor.  Patient reports he had some suicidal thoughts but it was nothing to be worried about but it was just early on today.  Patient denies current suicidal ideation.  Denies HI, according to patient he does hear voices talking all the time but he cannot say what they are saying and just keep talking talking talking.  Patient reports he drinks 2 beers yesterday, patient reports he used cocaine/oxycodone/hydrocodone on yesterday also.  According to patient he just want to get back on his meds because he is tired of all of this.  Recommend inpatient observation   Total Time spent with patient: 30 minutes  Musculoskeletal  Strength &  Muscle Tone: within normal limits Gait & Station: normal Patient leans: N/A  Psychiatric Specialty Exam  Presentation General Appearance:  Disheveled  Eye Contact: Good  Speech: Clear and Coherent  Speech Volume: Normal  Handedness: Right   Mood and Affect  Mood: Euthymic  Affect: Appropriate   Thought Process  Thought Processes: Coherent  Descriptions of Associations:Circumstantial  Orientation:Full (Time, Place and Person)  Thought Content:WDL  Diagnosis of Schizophrenia or Schizoaffective disorder in past: Yes  Duration of Psychotic Symptoms: Less than six months  Hallucinations:Hallucinations: Auditory Description of Auditory Hallucinations: hearing voices but could not say what the voices were saying  Ideas of Reference:None  Suicidal Thoughts:Suicidal Thoughts: Yes, Passive SI Passive Intent and/or Plan: Without Plan  Homicidal Thoughts:Homicidal Thoughts: No   Sensorium  Memory: Immediate Fair  Judgment: Poor  Insight: Fair   Materials engineer: Fair  Attention Span: Fair  Recall: [place] of Knowledge: Good  Language: Good   Psychomotor Activity  Psychomotor Activity: Psychomotor Activity: Normal   Assets  Assets: Desire for Improvement; Resilience   Sleep  Sleep: Sleep: Fair   Nutritional Assessment (For OBS and FBC admissions only) Has the patient had a weight loss or gain of 10 pounds or more in the last 3 months?: No Has the patient had a decrease in food intake/or appetite?: No Does the patient have dental problems?: No Does the patient have eating habits or behaviors that may be indicators of an eating disorder including binging or inducing vomiting?: No Has the patient recently lost weight without trying?: 0 Has the patient been eating  poorly because of a decreased appetite?: 0 Malnutrition Screening Tool Score: 0    Physical Exam HENT:     Head: Normocephalic.     Nose: Nose  normal.  Cardiovascular:     Rate and Rhythm: Normal rate.  Pulmonary:     Effort: Pulmonary effort is normal.  Musculoskeletal:        General: Normal range of motion.     Cervical back: Normal range of motion.  Skin:    General: Skin is warm.  Neurological:     General: No focal deficit present.     Mental Status: He is alert.  Psychiatric:        Mood and Affect: Mood normal.        Behavior: Behavior normal.        Thought Content: Thought content normal.        Judgment: Judgment normal.    Review of Systems  Constitutional: Negative.   HENT: Negative.    Eyes: Negative.   Respiratory: Negative.    Cardiovascular: Negative.   Gastrointestinal: Negative.   Genitourinary: Negative.   Musculoskeletal: Negative.   Skin: Negative.   Neurological: Negative.   Psychiatric/Behavioral:  Positive for substance abuse and suicidal ideas. The patient is nervous/anxious.     Blood pressure (!) 140/89, pulse 86, temperature 98.3 F (36.8 C), temperature source Oral, resp. rate 18, SpO2 97 %. There is no height or weight on file to calculate BMI.  Past Psychiatric History: Suicidal ideation, schizoaffective disorder, depression, hallucination, adjustment disorder  Is the patient at risk to self? Yes  Has the patient been a risk to self in the past 6 months? Yes .    Has the patient been a risk to self within the distant past? Yes   Is the patient a risk to others? No   Has the patient been a risk to others in the past 6 months? No   Has the patient been a risk to others within the distant past? No   Past Medical History: see chart  Family History: unknown   Social History: Cocaine abuse, opioid abuse  Last Labs:  Admission on 01/22/2022, Discharged on 01/24/2022  Component Date Value Ref Range Status   Sodium 01/22/2022 138  135 - 145 mmol/L Final   Potassium 01/22/2022 4.1  3.5 - 5.1 mmol/L Final   Chloride 01/22/2022 97 (L)  98 - 111 mmol/L Final   CO2 01/22/2022 28   22 - 32 mmol/L Final   Glucose, Bld 01/22/2022 120 (H)  70 - 99 mg/dL Final   Glucose reference range applies only to samples taken after fasting for at least 8 hours.   BUN 01/22/2022 <5 (L)  6 - 20 mg/dL Final   Creatinine, Ser 01/22/2022 0.91  0.61 - 1.24 mg/dL Final   Calcium 01/22/2022 10.0  8.9 - 10.3 mg/dL Final   Total Protein 01/22/2022 8.0  6.5 - 8.1 g/dL Final   Albumin 01/22/2022 4.5  3.5 - 5.0 g/dL Final   AST 01/22/2022 25  15 - 41 U/L Final   ALT 01/22/2022 29  0 - 44 U/L Final   Alkaline Phosphatase 01/22/2022 78  38 - 126 U/L Final   Total Bilirubin 01/22/2022 1.0  0.3 - 1.2 mg/dL Final   GFR, Estimated 01/22/2022 >60  >60 mL/min Final   Comment: (NOTE) Calculated using the CKD-EPI Creatinine Equation (2021)    Anion gap 01/22/2022 13  5 - 15 Final   Performed at [facility], [address], [place], [place] [address]   Alcohol, Ethyl (B) 01/22/2022 <10  <10 mg/dL Final   Comment: (NOTE) Lowest detectable limit for serum alcohol is 10 mg/dL.  For medical purposes only. Performed at [facility], [place] [address], [place], [place] [id number]    Salicylate Lvl 123XX123 <7.0 (L)  7.0 - 30.0 mg/dL Final   Performed at [place] [address], [place], [place] [address]   Acetaminophen (Tylenol), Serum 01/22/2022 <10 (L)  10 - 30 ug/mL Final   Comment: (NOTE) Therapeutic concentrations vary significantly. A range of 10-30 ug/mL  may be an effective concentration for many patients. However, some  are best treated at concentrations outside of this range. Acetaminophen concentrations >150 ug/mL at 4 hours after ingestion  and >50 ug/mL at 12 hours after ingestion are often associated with  toxic reactions.  Performed at [facility], [place] [address] [place], [place] [address]    WBC 01/22/2022 9.3  4.0 - 10.5 K/uL Final   RBC 01/22/2022 4.91  4.22 - 5.81 MIL/uL Final   Hemoglobin 01/22/2022 16.6  13.0 - 17.0 g/dL Final   HCT 01/22/2022  46.2  39.0 - 52.0 % Final   MCV 01/22/2022 94.1  80.0 - 100.0 fL Final   MCH 01/22/2022 33.8  26.0 - 34.0 pg Final   MCHC 01/22/2022 35.9  30.0 - 36.0 g/dL Final   RDW 01/22/2022 12.3  11.5 - 15.5 % Final   Platelets 01/22/2022 250  150 - 400 K/uL Final   nRBC 01/22/2022 0.0  0.0 - 0.2 % Final   Performed at [place] [address], [place], [place] [address]   Opiates 01/22/2022 NONE DETECTED  NONE DETECTED Final   Cocaine 01/22/2022 NONE DETECTED  NONE DETECTED Final   Benzodiazepines 01/22/2022 NONE DETECTED  NONE DETECTED Final   Amphetamines 01/22/2022 POSITIVE (A)  NONE DETECTED Final   Tetrahydrocannabinol 01/22/2022 NONE DETECTED  NONE DETECTED Final   Barbiturates 01/22/2022 NONE DETECTED  NONE DETECTED Final   Comment: (NOTE) DRUG SCREEN FOR MEDICAL PURPOSES ONLY.  IF CONFIRMATION IS NEEDED FOR ANY PURPOSE, NOTIFY LAB WITHIN 5 DAYS.  LOWEST DETECTABLE LIMITS FOR URINE DRUG SCREEN Drug Class                     Cutoff (ng/mL) Amphetamine and metabolites    1000 Barbiturate and metabolites    200 Benzodiazepine                 200 Opiates and metabolites        300 Cocaine and metabolites        300 THC                            50 Performed at [facility], [place] [address], [place], [place] [address]     Allergies: Patient has no known allergies.  Medications:  PTA Medications  Medication Sig   mesalamine (CANASA) 1000 MG suppository Place 1 suppository (1,000 mg total) rectally at bedtime. (Patient not taking: Reported on 01/23/2022)   OLANZapine (ZYPREXA) 5 MG tablet Take 1 tablet (5 mg total) by mouth at bedtime.   hydrOXYzine (ATARAX) 25 MG tablet Take 1 tablet (25 mg total) by mouth 3 (three) times daily as needed for anxiety.    Medical Decision Making  Inpatient observation    Lab Orders  Resp panel by RT-PCR (RSV, Flu A&B, Covid) Anterior Nasal Swab         CBC with Differential/Platelet         Comprehensive metabolic panel          Ethanol         Lipid panel         TSH         POCT Urine Drug Screen - (I-Screen)         POC SARS Coronavirus 2 Ag      Meds ordered this encounter  Medications   acetaminophen (TYLENOL) tablet 650 mg   alum & mag hydroxide-simeth (MAALOX/MYLANTA) 200-200-20 MG/5ML suspension 30 mL   magnesium hydroxide (MILK OF MAGNESIA) suspension 30 mL   AND Linked Order Group    OLANZapine zydis (ZYPREXA) disintegrating tablet 10 mg    LORazepam (ATIVAN) tablet 1 mg    ziprasidone (GEODON) injection 20 mg   hydrOXYzine (ATARAX) tablet 25 mg   melatonin tablet 5 mg   OLANZapine (ZYPREXA) tablet 5 mg     Recommendations  Based on my evaluation the patient appears to have an emergency medical condition for which I recommend the patient be transferred to the emergency department for further evaluation.  [name], NP 04/08/22  9:05 PM

## 2022-04-08 NOTE — ED Notes (Signed)
Patient admitted to Obs as he walked in voluntarily seeking for help in his medication regimen. Stated has not been on his meds for the past 3 months and needed help. Patient was cooperative during the admission assessment. Skin assessment complete. Belongings inventoried. Patient oriented to unit and unit rules. Juice offered to patient.  Patient verbalized agreement to treatment plans. Patient verbally contracts for safety while hospitalized. Will monitor for safety.

## 2022-04-09 ENCOUNTER — Encounter (HOSPITAL_COMMUNITY): Payer: Self-pay | Admitting: Emergency Medicine

## 2022-04-09 LAB — LIPID PANEL
Cholesterol: 173 mg/dL (ref 0–200)
HDL: 63 mg/dL (ref >40)
LDL Cholesterol: 53 mg/dL (ref 0–99)
Total CHOL/HDL Ratio: 2.7 RATIO
Triglycerides: 284 mg/dL — ABNORMAL HIGH (ref <150)
VLDL: 57 mg/dL — ABNORMAL HIGH (ref 0–40)

## 2022-04-09 LAB — COMPREHENSIVE METABOLIC PANEL
ALT: 34 U/L (ref 0–44)
AST: 23 U/L (ref 15–41)
Albumin: 4.3 g/dL (ref 3.5–5.0)
Alkaline Phosphatase: 71 U/L (ref 38–126)
Anion gap: 12 (ref 5–15)
BUN: <5 mg/dL — ABNORMAL LOW (ref 6–20)
CO2: 26 mmol/L (ref 22–32)
Calcium: 10.4 mg/dL — ABNORMAL HIGH (ref 8.9–10.3)
Chloride: 101 mmol/L (ref 98–111)
Creatinine, Ser: 0.88 mg/dL (ref 0.61–1.24)
GFR, Estimated: >60 mL/min (ref >60)
Glucose, Bld: 114 mg/dL — ABNORMAL HIGH (ref 70–99)
Potassium: 4.9 mmol/L (ref 3.5–5.1)
Sodium: 139 mmol/L (ref 135–145)
Total Bilirubin: 0.3 mg/dL (ref 0.3–1.2)
Total Protein: 7.4 g/dL (ref 6.5–8.1)

## 2022-04-09 LAB — TSH: TSH: 1.96 u[IU]/mL (ref 0.350–4.500)

## 2022-04-09 MED ORDER — OLANZAPINE 5 MG PO TABS: 5.0000 mg | ORAL_TABLET | Freq: Every day | ORAL | 0 refills | Status: DC

## 2022-04-09 MED ORDER — OLANZAPINE 5 MG PO TABS: 5.0000 mg | ORAL_TABLET | Freq: Every day | ORAL | Status: DC

## 2022-04-09 MED ORDER — MELATONIN 5 MG PO TABS: 5.0000 mg | ORAL_TABLET | Freq: Every evening | ORAL | 0 refills | Status: DC | PRN

## 2022-04-09 NOTE — ED Notes (Signed)
Called nonemergency transportation for services home. Patient informed staff that law enforcement brought him here and was told they would transport him home. Informed patient that the call has been made for transportation services. No eta provided.

## 2022-04-09 NOTE — ED Notes (Signed)
Discharge instructions provided and Pt stated understanding. Pt alert, orient and ambulatory prior to d/c from facility. Personal belongings returned from locker number 1. [place] [employer] providing transportation to home. Safety maintained.

## 2022-04-09 NOTE — Discharge Instructions (Addendum)
Patient is instructed prior to discharge to:  Take all medications as prescribed by his/her mental healthcare provider. Report any adverse effects and or reactions from the medicines to his/her outpatient provider promptly. Keep all scheduled appointments, to ensure that you are getting refills on time and to avoid any interruption in your medication.  If you are unable to keep an appointment call to reschedule.  Be sure to follow-up with resources and follow-up appointments provided.  Patient has been instructed & cautioned: To not engage in alcohol and or illegal drug use while on prescription medicines. In the event of worsening symptoms, patient is instructed to call the crisis hotline, 911 and or go to the nearest ED for appropriate evaluation and treatment of symptoms. To follow-up with his/her primary care provider for your other medical issues, concerns and or health care needs.  Information: -National Suicide Prevention Lifeline 1-800-SUICIDE or (925)715-2275.  -988 offers 24/7 access to trained crisis counselors who can help people experiencing mental health-related distress. People can call or text 988 or chat 988lifeline.org for themselves or if they are worried about a loved one who may need crisis support.         For your behavioral health needs, you are advised to follow up with an outpatient provider.  You may be eligible for ACT Team services, which would include more frequent visits with your provider, as well as in-home services.  The following providers offer ACT Team services.  Contact them at your earliest opportunity to ask about enrolling in their program:   Writer contacted Genta with [facility] and set up an intake appointment for the patient on Wednesday 04-14-2022 at 11am.  The intake worker will be coming to the patient home for the initial meeting. Writer informed the patient that [facility] will be coming to his home for the initial appointment.   Writer informed the NP, Otila Kluver,       [facility]      [address] [place], [place] [address]      [phone] [address] [place]      [place], [place] [address]      [phone]       [facility] [address] [place], [place] [address]      [phone]       [facility] ACT Team      [employer], [address]      [address]      [place], [place]  [address]      [phone]       [facility]      [address]      [place],  [address]      [phone]

## 2022-04-09 NOTE — ED Notes (Signed)
Pt sleeping at present, no distress noted.  Monitoring for safety. 

## 2022-04-09 NOTE — ED Provider Notes (Signed)
FBC/OBS ASAP Discharge Summary  Date and Time: 04/09/2022 8:56 AM  Name: [name]  MRN:  [id number]   Discharge Diagnoses:  Final diagnoses:  H/O medication noncompliance  Suicidal thoughts  Opioid abuse ([place])    Subjective: Patient states "I am ready to go back home, I just came in because I wanted to get back on my medication and also get a new act team."  [name] is reassessed, face-to-face by nurse practitioner.  He is reclined in observation area upon approach, appears asleep.  He is easily awakened.  He is alert and oriented, pleasant and cooperative during assessment.  Patient reports readiness to discharge home.  Patient presents with euthymic mood, congruent affect.  Patient reports history of schizoaffective disorder, bipolar type, polysubstance use disorder including alcohol use.  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  Patient has been followed by [facility] however fired ACT team 2 years ago.  He also stopped his medications 2 years ago.  Patient endorses history of multiple previous inpatient psychiatric hospitalizations.  Most recent hospitalization approximately 3 years ago at [facility] behavioral health.  No family mental health or addiction history reported.  Dshon denies suicidal and homicidal ideations today.  He easily contracts verbally for safety with this Probation officer.  He denies auditory and visual hallucinations.  There is no evidence of delusional thought content and no indication that patient is responding to internal stimuli.  Patient denies symptoms of paranoia.  Patient reports recent stressors include substance use disorder.  He states "I need to stay off of the alcohol and off of the pills (opiates)."  Patient states "I have stopped smoking marijuana but I need to stop smoking crack cocaine, I have been weaning myself off."  Patient offered facility based crisis unit admission or residential substance  use treatment, he declines.  He states "I really just want to go home."  [name] resides in [place] with his mother.  He denies access to weapons.  He receives SSI income.  He reports average appetite and decreased sleep.  He attributes decreased sleep to cocaine use.  Patient offered support and encouragement.  He declines any person to contact for collateral information at this time.  He states "my mom has a fall but she will give me the number anymore."   Patient educated and verbalizes understanding of mental health resources and other crisis services in the community. They are instructed to call 911 and present to the nearest emergency room should patient experience any suicidal/homicidal ideation, auditory/visual/hallucinations, or detrimental worsening of mental health condition.     Stay Summary: 02/15/ 2024- 2056pm [name],  52 y/o male with a history of suicidal ideation schizoaffective disorder depression, hallucination, adjustment disorder, bipolar disorder medication noncompliance.  Presented to [facility] via [facility] voluntarily.  Per the patient he needs to get back on his medications last time he took them was probably 6 to 8 months ago.  According to patient he had an ACT team at 1 point but per the patient ACT team through [facility] according to the patient he had to fired them because they were money hungry.  According to the patient he is currently not seeing a psychiatrist or therapist.  Per the patient he lives with his mother, patient is unemployed.  According to patient he drinks alcohol 2 days ago, patient reported he does cocaine/pills which include oxycodone and hydrocodone every other day.   Face-to-face observation of patient, patient is alert and  oriented x 4, speech is clear maintain minimal eye contact.  Patient is very cooperative and answers questions appropriately.  Patient is calm and is demeanor.  Patient reports he had some suicidal thoughts but it  was nothing to be worried about but it was just early on today.  Patient denies current suicidal ideation.  Denies HI, according to patient he does hear voices talking all the time but he cannot say what they are saying and just keep talking talking talking.  Patient reports he drinks 2 beers yesterday, patient reports he used cocaine/oxycodone/hydrocodone on yesterday also.  According to patient he just want to get back on his meds because he is tired of all of this.   Recommend inpatient observation   Total Time spent with patient: 30 minutes  Past Psychiatric History: see above Past Medical History: Abdominal pain, tobacco use Family History: none reported Family Psychiatric History: none reported Social History: Resides with mother, polysubstance use current Tobacco Cessation:  A prescription for an FDA-approved tobacco cessation medication was offered at discharge and the patient refused  Current Medications:  Current Facility-Administered Medications  Medication Dose Route Frequency Provider Last Rate Last Admin   acetaminophen (TYLENOL) tablet 650 mg  650 mg Oral Q6H PRN [name], NP       alum & mag hydroxide-simeth (MAALOX/MYLANTA) 200-200-20 MG/5ML suspension 30 mL  30 mL Oral Q4H PRN [name], NP       hydrOXYzine (ATARAX) tablet 25 mg  25 mg Oral TID PRN Onuoha, Chinwendu V, NP   25 mg at 04/08/22 2234   OLANZapine zydis (ZYPREXA) disintegrating tablet 10 mg  10 mg Oral Q8H PRN [name], NP       And   LORazepam (ATIVAN) tablet 1 mg  1 mg Oral PRN [name], NP       And   ziprasidone (GEODON) injection 20 mg  20 mg Intramuscular PRN [name], NP       magnesium hydroxide (MILK OF MAGNESIA) suspension 30 mL  30 mL Oral Daily PRN [name], NP       melatonin tablet 5 mg  5 mg Oral QHS PRN Onuoha, Chinwendu V, NP   5 mg at 04/08/22 2234   OLANZapine (ZYPREXA) tablet 5 mg  5 mg Oral QHS [name], NP       Current Outpatient Medications   Medication Sig Dispense Refill   hydrOXYzine (ATARAX) 25 MG tablet Take 1 tablet (25 mg total) by mouth 3 (three) times daily as needed for anxiety. 30 tablet 0   mesalamine (CANASA) 1000 MG suppository Place 1 suppository (1,000 mg total) rectally at bedtime. (Patient not taking: Reported on 01/23/2022) 7 suppository 12   OLANZapine (ZYPREXA) 5 MG tablet Take 1 tablet (5 mg total) by mouth at bedtime. 30 tablet 0    PTA Medications:  Facility Ordered Medications  Medication   acetaminophen (TYLENOL) tablet 650 mg   alum & mag hydroxide-simeth (MAALOX/MYLANTA) 200-200-20 MG/5ML suspension 30 mL   magnesium hydroxide (MILK OF MAGNESIA) suspension 30 mL   OLANZapine zydis (ZYPREXA) disintegrating tablet 10 mg   And   LORazepam (ATIVAN) tablet 1 mg   And   ziprasidone (GEODON) injection 20 mg   hydrOXYzine (ATARAX) tablet 25 mg   melatonin tablet 5 mg   OLANZapine (ZYPREXA) tablet 5 mg   PTA Medications  Medication Sig   mesalamine (CANASA) 1000 MG suppository Place 1 suppository (1,000 mg total) rectally at bedtime. (Patient not taking: Reported on  01/23/2022)   OLANZapine (ZYPREXA) 5 MG tablet Take 1 tablet (5 mg total) by mouth at bedtime.   hydrOXYzine (ATARAX) 25 MG tablet Take 1 tablet (25 mg total) by mouth 3 (three) times daily as needed for anxiety.        No data to display          [facility] ED from 04/08/2022 in [facility] ED from 01/22/2022 in [facility] Emergency Department at [facility] ED from 08/11/2021 in [facility] Emergency Department at [place] Error: Question 6 not populated No Risk No Risk       Musculoskeletal  Strength & Muscle Tone: within normal limits Gait & Station: normal Patient leans: N/A  Psychiatric Specialty Exam  Presentation  General Appearance:  Casual  Eye Contact: Good  Speech: Clear and Coherent; Normal Rate  Speech  Volume: Normal  Handedness: Right   Mood and Affect  Mood: Euthymic  Affect: Appropriate; Congruent   Thought Process  Thought Processes: Coherent; Goal Directed; Linear  Descriptions of Associations:Intact  Orientation:Full (Time, Place and Person)  Thought Content:Logical; WDL  Diagnosis of Schizophrenia or Schizoaffective disorder in past: Yes  Duration of Psychotic Symptoms: Less than six months   Hallucinations:Hallucinations: None Description of Auditory Hallucinations: hearing voices but could not say what the voices were saying  Ideas of Reference:None  Suicidal Thoughts:Suicidal Thoughts: No SI Passive Intent and/or Plan: Without Plan  Homicidal Thoughts:Homicidal Thoughts: No   Sensorium  Memory: Immediate Good; Recent Fair  Judgment: Intact  Insight: Fair   Community education officer  Concentration: Good  Attention Span: Good  Recall: Good  Fund of Knowledge: Fair  Language: Fair   Psychomotor Activity  Psychomotor Activity: Psychomotor Activity: Normal   Assets  Assets: Communication Skills; Desire for Improvement; Social Support; Resilience; Housing; Physical Health   Sleep  Sleep: Sleep: Fair   Nutritional Assessment (For OBS and FBC admissions only) Has the patient had a weight loss or gain of 10 pounds or more in the last 3 months?: No Has the patient had a decrease in food intake/or appetite?: No Does the patient have dental problems?: No Does the patient have eating habits or behaviors that may be indicators of an eating disorder including binging or inducing vomiting?: No Has the patient recently lost weight without trying?: 0 Has the patient been eating poorly because of a decreased appetite?: 0 Malnutrition Screening Tool Score: 0    Physical Exam  Physical Exam Vitals and nursing note reviewed.  Constitutional:      Appearance: Normal appearance. He is well-developed and normal weight.  HENT:     Head:  Normocephalic and atraumatic.     Nose: Nose normal.  Cardiovascular:     Rate and Rhythm: Normal rate.  Pulmonary:     Effort: Pulmonary effort is normal.  Musculoskeletal:        General: Normal range of motion.     Cervical back: Normal range of motion.  Skin:    General: Skin is warm and dry.  Neurological:     Mental Status: He is alert and oriented to person, place, and time.  Psychiatric:        Attention and Perception: Attention and perception normal.        Mood and Affect: Mood and affect normal.        Speech: Speech normal.        Behavior: Behavior normal. Behavior is cooperative.  Thought Content: Thought content normal.        Cognition and Memory: Cognition normal.   Review of Systems  Constitutional: Negative.   HENT: Negative.    Eyes: Negative.   Respiratory: Negative.    Cardiovascular: Negative.   Gastrointestinal: Negative.   Genitourinary: Negative.   Musculoskeletal: Negative.   Skin: Negative.   Neurological: Negative.   Psychiatric/Behavioral:  Positive for substance abuse.    Blood pressure 95/75, pulse 94, temperature 98.4 F (36.9 C), resp. rate 18, SpO2 100 %. There is no height or weight on file to calculate BMI.  Demographic Factors:  Male and Caucasian  Loss Factors: NA  Historical Factors: NA  Risk Reduction Factors:   Sense of responsibility to family, Living with another person, especially a relative, Positive social support, Positive therapeutic relationship, and Positive coping skills or problem solving skills  Continued Clinical Symptoms:  Alcohol/Substance Abuse/Dependencies Previous Psychiatric Diagnoses and Treatments  Cognitive Features That Contribute To Risk:  None    Suicide Risk:  Minimal: No identifiable suicidal ideation.  Patients presenting with no risk factors but with morbid ruminations; may be classified as minimal risk based on the severity of the depressive symptoms  Plan Of Care/Follow-up  recommendations:  Follow-up with outpatient psychiatry, resources provided.  Envisions of life initial screening schedule. Continue current medications: -Hydroxyzine 25 mg 3 times daily and as needed/anxiety -Melatonin 5 mg nightly as needed/sleep -Olanzapine 5 mg nightly  Disposition: Discharge  Lucky Rathke, FNP 04/09/2022, 8:56 AM

## 2022-04-09 NOTE — ED Notes (Signed)
Patient currently resting on pullout bed/chair in no distress observed. Safety maintained and will continue to monitor.

## 2022-06-15 ENCOUNTER — Other Ambulatory Visit: Payer: Self-pay

## 2022-06-15 ENCOUNTER — Emergency Department (HOSPITAL_COMMUNITY)
Admission: EM | Admit: 2022-06-15 | Discharge: 2022-06-17 | Disposition: A | Discharge status: Home / Self Care | Payer: Medicaid Other | Attending: Emergency Medicine | Admitting: Emergency Medicine

## 2022-06-15 ENCOUNTER — Encounter (HOSPITAL_COMMUNITY): Payer: Self-pay

## 2022-06-15 DIAGNOSIS — R45850 Homicidal ideations: Secondary | ICD-10-CM | POA: Insufficient documentation

## 2022-06-15 DIAGNOSIS — F19159 Other psychoactive substance abuse with psychoactive substance-induced psychotic disorder, unspecified: Secondary | ICD-10-CM | POA: Insufficient documentation

## 2022-06-15 DIAGNOSIS — X58XXXA Exposure to other specified factors, initial encounter: Secondary | ICD-10-CM | POA: Insufficient documentation

## 2022-06-15 DIAGNOSIS — Z8673 Personal history of transient ischemic attack (TIA), and cerebral infarction without residual deficits: Secondary | ICD-10-CM | POA: Insufficient documentation

## 2022-06-15 DIAGNOSIS — Z1152 Encounter for screening for COVID-19: Secondary | ICD-10-CM | POA: Insufficient documentation

## 2022-06-15 DIAGNOSIS — Z5900 Homelessness unspecified: Secondary | ICD-10-CM | POA: Insufficient documentation

## 2022-06-15 DIAGNOSIS — R45851 Suicidal ideations: Secondary | ICD-10-CM | POA: Diagnosis not present

## 2022-06-15 DIAGNOSIS — S90822A Blister (nonthermal), left foot, initial encounter: Secondary | ICD-10-CM | POA: Insufficient documentation

## 2022-06-15 DIAGNOSIS — F250 Schizoaffective disorder, bipolar type: Secondary | ICD-10-CM | POA: Insufficient documentation

## 2022-06-15 DIAGNOSIS — F29 Unspecified psychosis not due to a substance or known physiological condition: Secondary | ICD-10-CM | POA: Insufficient documentation

## 2022-06-15 DIAGNOSIS — F19959 Other psychoactive substance use, unspecified with psychoactive substance-induced psychotic disorder, unspecified: Secondary | ICD-10-CM | POA: Diagnosis present

## 2022-06-15 HISTORY — DX: Schizoaffective disorder, bipolar type: F25.0

## 2022-06-15 LAB — COMPREHENSIVE METABOLIC PANEL
ALT: 33 U/L (ref 0–44)
AST: 29 U/L (ref 15–41)
Albumin: 4.3 g/dL (ref 3.5–5.0)
Alkaline Phosphatase: 75 U/L (ref 38–126)
Anion gap: 10 (ref 5–15)
BUN: 8 mg/dL (ref 6–20)
CO2: 22 mmol/L (ref 22–32)
Calcium: 9.3 mg/dL (ref 8.9–10.3)
Chloride: 104 mmol/L (ref 98–111)
Creatinine, Ser: 0.91 mg/dL (ref 0.61–1.24)
GFR, Estimated: >60 mL/min (ref >60)
Glucose, Bld: 123 mg/dL — ABNORMAL HIGH (ref 70–99)
Potassium: 4 mmol/L (ref 3.5–5.1)
Sodium: 136 mmol/L (ref 135–145)
Total Bilirubin: 0.9 mg/dL (ref 0.3–1.2)
Total Protein: 7.5 g/dL (ref 6.5–8.1)

## 2022-06-15 LAB — CBC WITH DIFFERENTIAL/PLATELET
Abs Immature Granulocytes: 0.02 10*3/uL (ref 0.00–0.07)
Basophils Absolute: 0 10*3/uL (ref 0.0–0.1)
Basophils Relative: 0 %
Eosinophils Absolute: 0 10*3/uL (ref 0.0–0.5)
Eosinophils Relative: 1 %
HCT: 45.3 % (ref 39.0–52.0)
Hemoglobin: 15.8 g/dL (ref 13.0–17.0)
Immature Granulocytes: 0 %
Lymphocytes Relative: 26 %
Lymphs Abs: 1.8 10*3/uL (ref 0.7–4.0)
MCH: 33.1 pg (ref 26.0–34.0)
MCHC: 34.9 g/dL (ref 30.0–36.0)
MCV: 95 fL (ref 80.0–100.0)
Monocytes Absolute: 0.5 10*3/uL (ref 0.1–1.0)
Monocytes Relative: 7 %
Neutro Abs: 4.8 10*3/uL (ref 1.7–7.7)
Neutrophils Relative %: 66 %
Platelets: 248 10*3/uL (ref 150–400)
RBC: 4.77 MIL/uL (ref 4.22–5.81)
RDW: 12.1 % (ref 11.5–15.5)
WBC: 7.2 10*3/uL (ref 4.0–10.5)
nRBC: 0 % (ref 0.0–0.2)

## 2022-06-15 LAB — SARS CORONAVIRUS 2 BY RT PCR: SARS Coronavirus 2 by RT PCR: NEGATIVE

## 2022-06-15 LAB — RAPID URINE DRUG SCREEN, HOSP PERFORMED
Amphetamines: NOT DETECTED
Barbiturates: NOT DETECTED
Benzodiazepines: NOT DETECTED
Cocaine: NOT DETECTED
Opiates: NOT DETECTED
Tetrahydrocannabinol: NOT DETECTED

## 2022-06-15 LAB — SALICYLATE LEVEL: Salicylate Lvl: <7 mg/dL — ABNORMAL LOW (ref 7.0–30.0)

## 2022-06-15 LAB — RAPID HIV SCREEN (HIV 1/2 AB+AG)
HIV 1/2 Antibodies: NONREACTIVE
HIV-1 P24 Antigen - HIV24: NONREACTIVE

## 2022-06-15 LAB — ETHANOL: Alcohol, Ethyl (B): <10 mg/dL (ref <10)

## 2022-06-15 LAB — ACETAMINOPHEN LEVEL: Acetaminophen (Tylenol), Serum: <10 ug/mL — ABNORMAL LOW (ref 10–30)

## 2022-06-15 MED ORDER — DROPERIDOL 2.5 MG/ML IJ SOLN
2.5000 mg | Freq: Once | INTRAMUSCULAR | Status: AC
  Administered 2022-06-15: 2.5 mg via INTRAMUSCULAR
  Filled 2022-06-15: qty 2

## 2022-06-15 MED ORDER — ONDANSETRON HCL 4 MG PO TABS: 4.0000 mg | ORAL_TABLET | Freq: Three times a day (TID) | ORAL | Status: DC | PRN

## 2022-06-15 MED ORDER — MELATONIN 3 MG PO TABS
3.0000 mg | ORAL_TABLET | Freq: Every day | ORAL | Status: DC
  Filled 2022-06-15: qty 1

## 2022-06-15 MED ORDER — PALIPERIDONE ER 3 MG PO TB24
3.0000 mg | ORAL_TABLET | Freq: Every day | ORAL | Status: DC
  Administered 2022-06-16 – 2022-06-17 (×2): 3 mg via ORAL
  Filled 2022-06-15 (×4): qty 1

## 2022-06-15 MED ORDER — IBUPROFEN 400 MG PO TABS: 600.0000 mg | ORAL_TABLET | Freq: Three times a day (TID) | ORAL | Status: DC | PRN

## 2022-06-15 MED ORDER — OLANZAPINE 5 MG PO TBDP
5.0000 mg | ORAL_TABLET | Freq: Once | ORAL | Status: AC
  Administered 2022-06-15: 5 mg via ORAL
  Filled 2022-06-15: qty 1

## 2022-06-15 MED ORDER — NICOTINE 21 MG/24HR TD PT24
21.0000 mg | MEDICATED_PATCH | Freq: Every day | TRANSDERMAL | Status: DC
  Administered 2022-06-15 – 2022-06-17 (×3): 21 mg via TRANSDERMAL
  Filled 2022-06-15 (×3): qty 1

## 2022-06-15 NOTE — ED Notes (Signed)
IM droperidol given w/ security at bedside. Explained to pt that he has to put the scrubs on per policy. Security officer was able to convince pt to put scrubs on. Pt still responding to internal stimuli w/ intermittent bursts of loud speech.

## 2022-06-15 NOTE — ED Notes (Signed)
Effie Shy NP at bedside speaking w/ pt

## 2022-06-15 NOTE — ED Notes (Signed)
Pt refusing to wear scrubs d/t "It's not my color" Pt has been yelling in room responding to internal stimuli. Pt initially refused zyprexa, but then was able to be convinced to take it.
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
## 2022-06-15 NOTE — ED Notes (Signed)
Pt belongings placed in locker number 1.  Non-inventoried
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
## 2022-06-15 NOTE — ED Notes (Signed)
Called lab to check on status of labs in process as well as resp swab that was sent. Per lab "they are on the analyzer and when they are done they're done". Transferred to [facility] and they confirmed that they have the covid swab

## 2022-06-15 NOTE — Progress Notes (Addendum)
LCSW Progress Note  [id number]   [name]  06/15/2022  10:04 PM    Inpatient Behavioral Health Placement  Pt meets inpatient criteria per Eligha Bridegroom, NP. There are no available beds within [facility] system per [facility] Rona Ravens, RN. Referral was sent to the following facilities;    Destination  Service Provider Address Phone Fax  [facility]  [address], [place] [address] [phone]  [facility]  [address] [place] [address] [phone]  [facility] [address]  [address], [place] [address] [phone]  [facility]  [address] [place], [place] [address] [phone]  [facility] [address] [place]  [address] [place] [address] [phone]  [facility] [place] [address] [phone]  [facility]  [address], [place] [address] [phone]  [place]  [address] [place], [place] [address] [phone]  [facility]  [address] [place] [address] [phone]  [facility]  [address], [place] [address] [phone]  [facility]  [address] [place] [address] [phone]  [facility]  [address], [place] [address] [phone]  [facility]  [address] [place] [address] [phone]  [facility]  [address] [place] [address] [phone]  [facility]  [address] [place] [address] [phone]  [facility]  [address] [place] [address] [phone]  [facility]  [address] [place] [address] [phone]  [facility]  [address] [place] [address] [phone]  [facility]  [address] [place] [address] [phone]  [facility]  [address], [place] [address] [phone]  [facility]  [address] [place] [address] [phone]  [facility]  [address] [place] [address] [phone]  [facility]  [address] [place] [address] [phone]   Situation ongoing,  CSW will follow up.    [name], MSW, [facility] 06/15/2022 10:04 PM

## 2022-06-15 NOTE — Consult Note (Incomplete)
BH ED ASSESSMENT   Reason for Consult:  *** Referring Physician:  *** Patient Identification: [name] MRN:  [id number] ED Chief Complaint: Schizoaffective disorder, bipolar type  Diagnosis:  Principal Problem:   Schizoaffective disorder, bipolar type Active Problems:   Substance-induced psychotic disorder   ED Assessment Time Calculation: Start Time: 1730 Stop Time: 1830 Total Time in Minutes (Assessment Completion): 60   Subjective:   [name] is a 52 y.o. male patient admitted with ***.  HPI:  ***  Past Psychiatric History: ***  Risk to Self or Others: Is the patient at risk to self? {yes/no:20286} Has the patient been a risk to self in the past 6 months? {yes/no:20286} Has the patient been a risk to self within the distant past? {yes/no:20286} Is the patient a risk to others? {yes/no:20286} Has the patient been a risk to others in the past 6 months? {yes/no:20286} Has the patient been a risk to others within the distant past? {yes/no:20286}  [place] Scale:  Flowsheet Row ED from 06/15/2022 in [facility] Emergency Department at [facility] ED from 04/08/2022 in [facility] ED from 01/22/2022 in [facility] Emergency Department at [facility]  C-SSRS RISK CATEGORY Moderate Risk Error: Question 6 not populated No Risk       AIMS:  , , ,  ,   ASAM:    Substance Abuse:     Past Medical History:  Past Medical History:  Diagnosis Date  . Anxiety   . Bipolar 1 disorder (HCC)   . Stroke [facility])     Past Surgical History:  Procedure Laterality Date  . CHOLECYSTECTOMY N/A 12/24/2012   Procedure: LAPAROSCOPIC CHOLECYSTECTOMY;  Surgeon: Cherylynn Ridges, MD;  Location: [facility] OR;  Service: General;  Laterality: N/A;  . FINGER SURGERY     Family History:  Family History  Problem Relation Age of Onset  . Heart disease Mother    Family Psychiatric  History: *** Social History:  Social History   Substance and Sexual  Activity  Alcohol Use Yes  . Alcohol/week: 20.0 standard drinks of alcohol  . Types: 20 Cans of beer per week   Comment: BAC was clear     Social History   Substance and Sexual Activity  Drug Use No  . Types: Marijuana   Comment: UDS was clear    Social History   Socioeconomic History  . Marital status: Single    Spouse name: Not on file  . Number of children: Not on file  . Years of education: Not on file  . Highest education level: Not on file  Occupational History  . Not on file  Tobacco Use  . Smoking status: Every Day    Packs/day: 1.00    Years: 5.00    Additional pack years: 0.00    Total pack years: 5.00    Types: Cigarettes  . Smokeless tobacco: Never  Substance and Sexual Activity  . Alcohol use: Yes    Alcohol/week: 20.0 standard drinks of alcohol    Types: 20 Cans of beer per week    Comment: BAC was clear  . Drug use: No    Types: Marijuana    Comment: UDS was clear  . Sexual activity: Never    Birth control/protection: Condom  Other Topics Concern  . Not on file  Social History Narrative  . Not on file   Social Determinants of Health   Financial Resource Strain: Not on file  Food Insecurity:  Not on file  Transportation Needs: Not on file  Physical Activity: Not on file  Stress: Not on file  Social Connections: Not on file   Additional Social History:    Allergies:  No Known Allergies  Labs:  Results for orders placed or performed during the hospital encounter of 06/15/22 (from the past 48 hour(s))  SARS Coronavirus 2 by RT PCR (hospital order, performed in [facility] hospital lab) *cepheid single result test* Anterior Nasal Swab     Status: None   Collection Time: 06/15/22  1:57 PM   Specimen: Anterior Nasal Swab  Result Value Ref Range   SARS Coronavirus 2 by RT PCR NEGATIVE NEGATIVE    Comment: Performed at [facility], [address], [place], [place] [address]  Comprehensive metabolic panel     Status: Abnormal    Collection Time: 06/15/22  2:00 PM  Result Value Ref Range   Sodium 136 135 - 145 mmol/L   Potassium 4.0 3.5 - 5.1 mmol/L   Chloride 104 98 - 111 mmol/L   CO2 22 22 - 32 mmol/L   Glucose, Bld 123 (H) 70 - 99 mg/dL    Comment: Glucose reference range applies only to samples taken after fasting for at least 8 hours.   BUN 8 6 - 20 mg/dL   Creatinine, Ser 6.04 0.61 - 1.24 mg/dL   Calcium 9.3 8.9 - 54.0 mg/dL   Total Protein 7.5 6.5 - 8.1 g/dL   Albumin 4.3 3.5 - 5.0 g/dL   AST 29 15 - 41 U/L   ALT 33 0 - 44 U/L   Alkaline Phosphatase 75 38 - 126 U/L   Total Bilirubin 0.9 0.3 - 1.2 mg/dL   GFR, Estimated >98 >11 mL/min    Comment: (NOTE) Calculated using the CKD-EPI Creatinine Equation (2021)    Anion gap 10 5 - 15    Comment: Performed at [facility], [address], [place], [place] [address]  Ethanol     Status: None   Collection Time: 06/15/22  2:00 PM  Result Value Ref Range   Alcohol, Ethyl (B) <10 <10 mg/dL    Comment: (NOTE) Lowest detectable limit for serum alcohol is 10 mg/dL.  For medical purposes only. Performed at [facility] Lab, [address], [place], [place] [address]   CBC with Diff     Status: None   Collection Time: 06/15/22  2:00 PM  Result Value Ref Range   WBC 7.2 4.0 - 10.5 K/uL   RBC 4.77 4.22 - 5.81 MIL/uL   Hemoglobin 15.8 13.0 - 17.0 g/dL   HCT 13.0 86.5 - 78.4 %   MCV 95.0 80.0 - 100.0 fL   MCH 33.1 26.0 - 34.0 pg   MCHC 34.9 30.0 - 36.0 g/dL   RDW 69.6 29.5 - 28.4 %   Platelets 248 150 - 400 K/uL   nRBC 0.0 0.0 - 0.2 %   Neutrophils Relative % 66 %   Neutro Abs 4.8 1.7 - 7.7 K/uL   Lymphocytes Relative 26 %   Lymphs Abs 1.8 0.7 - 4.0 K/uL   Monocytes Relative 7 %   Monocytes Absolute 0.5 0.1 - 1.0 K/uL   Eosinophils Relative 1 %   Eosinophils Absolute 0.0 0.0 - 0.5 K/uL   Basophils Relative 0 %   Basophils Absolute 0.0 0.0 - 0.1 K/uL   Immature Granulocytes 0 %   Abs Immature Granulocytes 0.02 0.00 - 0.07 K/uL    Comment:  Performed at [facility] Lab, [address], [place], [place] [address]  Acetaminophen level     Status: Abnormal   Collection Time: 06/15/22  2:00 PM  Result Value Ref Range   Acetaminophen (Tylenol), Serum <10 (L) 10 - 30 ug/mL    Comment: (NOTE) Therapeutic concentrations vary significantly. A range of 10-30 ug/mL  may be an effective concentration for many patients. However, some  are best treated at concentrations outside of this range. Acetaminophen concentrations >150 ug/mL at 4 hours after ingestion  and >50 ug/mL at 12 hours after ingestion are often associated with  toxic reactions.  Performed at [facility], [address] [place], [place] [address]   Salicylate level     Status: Abnormal   Collection Time: 06/15/22  2:00 PM  Result Value Ref Range   Salicylate Lvl <7.0 (L) 7.0 - 30.0 mg/dL    Comment: Performed at [name], [address], [place], [place] [address]  Rapid HIV screen (HIV 1/2 Ab+Ag)     Status: None   Collection Time: 06/15/22  2:05 PM  Result Value Ref Range   HIV-1 P24 Antigen - HIV24 NON REACTIVE NON REACTIVE    Comment: (NOTE) Detection of p24 may be inhibited by biotin in the sample, causing false negative results in acute infection.    HIV 1/2 Antibodies NON REACTIVE NON REACTIVE   Interpretation (HIV Ag Ab)      A non reactive test result means that HIV 1 or HIV 2 antibodies and HIV 1 p24 antigen were not detected in the specimen.    Comment: Performed at [facility], [address] [place], [place] [address]  Urine rapid drug screen (hosp performed)     Status: None   Collection Time: 06/15/22  3:00 PM  Result Value Ref Range   Opiates NONE DETECTED NONE DETECTED   Cocaine NONE DETECTED NONE DETECTED   Benzodiazepines NONE DETECTED NONE DETECTED   Amphetamines NONE DETECTED NONE DETECTED   Tetrahydrocannabinol NONE DETECTED NONE DETECTED   Barbiturates NONE DETECTED NONE DETECTED    Comment: (NOTE) DRUG  SCREEN FOR MEDICAL PURPOSES ONLY.  IF CONFIRMATION IS NEEDED FOR ANY PURPOSE, NOTIFY LAB WITHIN 5 DAYS.  LOWEST DETECTABLE LIMITS FOR URINE DRUG SCREEN Drug Class                     Cutoff (ng/mL) Amphetamine and metabolites    1000 Barbiturate and metabolites    200 Benzodiazepine                 200 Opiates and metabolites        300 Cocaine and metabolites        300 THC                            50 Performed at [facility], [address] [place], [place] [address]     Current Facility-Administered Medications  Medication Dose Route Frequency Provider Last Rate Last Admin  . ibuprofen (ADVIL) tablet 600 mg  600 mg Oral Q8H PRN Jeannie Fend, PA-C      . melatonin tablet 3 mg  3 mg Oral QHS Army Melia A, PA-C      . nicotine (NICODERM CQ - dosed in mg/24 hours) patch 21 mg  21 mg Transdermal Daily Army Melia A, PA-C   21 mg at 06/15/22 1717  .  ondansetron (ZOFRAN) tablet 4 mg  4 mg Oral Q8H PRN Jeannie Fend, PA-C       Current Outpatient Medications  Medication Sig Dispense Refill  . hydrOXYzine (ATARAX) 25 MG tablet Take 1 tablet (25 mg total) by mouth 3 (three) times daily as needed for anxiety. 30 tablet 0  . melatonin 5 MG TABS Take 1 tablet (5 mg total) by mouth at bedtime as needed. 30 tablet 0  . OLANZapine (ZYPREXA) 5 MG tablet Take 1 tablet (5 mg total) by mouth at bedtime. 30 tablet 0    Musculoskeletal: Strength & Muscle Tone: {desc; muscle tone:32375} Gait & Station: {PE GAIT ED ZOXW:96045} Patient leans: {Patient Leans:21022755}   Psychiatric Specialty Exam: Presentation  General Appearance:  Fairly Groomed  Eye Contact: Fair  Speech: Pressured  Speech Volume: Normal  Handedness: Right   Mood and Affect  Mood: Euthymic  Affect: Blunt   Thought Process  Thought Processes: Disorganized  Descriptions of Associations:Circumstantial  Orientation:Full (Time, Place and Person)  Thought Content:Paranoid Ideation;  Illogical  History of Schizophrenia/Schizoaffective disorder:Yes  Duration of Psychotic Symptoms:Greater than six months  Hallucinations:Hallucinations: Auditory; Visual  Ideas of Reference:None  Suicidal Thoughts:Suicidal Thoughts: No  Homicidal Thoughts:Homicidal Thoughts: No   Sensorium  Memory: Immediate Fair; Recent Fair  Judgment: Impaired  Insight: Lacking   Executive Functions  Concentration: Fair  Attention Span: Fair  Recall: Fair  Fund of Knowledge: Fair  Language: Fair   Psychomotor Activity  Psychomotor Activity: Psychomotor Activity: Normal   Assets  Assets: Desire for Improvement; Leisure Time; Physical Health; Resilience    Sleep  Sleep: Sleep: Fair   Physical Exam: Physical Exam Neurological:     Mental Status: He is alert and oriented to person, place, and time.  Psychiatric:        Attention and Perception: He perceives auditory and visual hallucinations.        Mood and Affect: Mood normal.        Speech: Speech is rapid and pressured.        Behavior: Behavior is cooperative.        Thought Content: Thought content is paranoid and delusional.    Review of Systems  Psychiatric/Behavioral:  Positive for hallucinations and substance abuse. The patient has insomnia.   All other systems reviewed and are negative.  Blood pressure 130/85, pulse 82, temperature 97.7 F (36.5 C), temperature source Oral, resp. rate 16, SpO2 97 %. There is no height or weight on file to calculate BMI.  Medical Decision Making: ***  Problem 1: ***  Problem 2: ***  Problem 3: ***  Disposition: {CHL BHH Consult WUJW:11914}  Eligha Bridegroom, NP 06/15/2022 5:38 PM
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
## 2022-06-16 NOTE — ED Notes (Signed)
IVC'd 06/15/22, exp 06/22/22-docs in purple zone

## 2022-06-16 NOTE — ED Provider Notes (Signed)
Emergency Medicine Observation Re-evaluation Note  [name] is a 52 y.o. male, seen on rounds today.  Pt initially presented to the ED for complaints of Homicidal and Psychosis Currently, the patient is resting in NAD.  Physical Exam  BP 130/85 (BP Location: Right Arm)   Pulse 82   Temp 97.7 F (36.5 C) (Oral)   Resp 16   SpO2 97%  Physical Exam General: Appears to be resting comfortably in bed, no acute distress. Cardiac: Regular rate, normal heart rate, non-emergent blood pressure for this morning's vitals. Lungs: No increased work of breathing.  Equal chest rise appreciated Psych: Calm, asleep in bed.   ED Course / MDM  EKG:EKG Interpretation  Date/Time:  Tuesday June 15 2022 15:34:42 EDT Ventricular Rate:  78 PR Interval:  164 QRS Duration: 86 QT Interval:  370 QTC Calculation: 421 R Axis:   2 Text Interpretation: Normal sinus rhythm Normal ECG When compared with ECG of 09-Apr-2022 07:04, PREVIOUS ECG IS PRESENT no stemi Confirmed by Tanda Rockers ([id number]) on 06/15/2022 4:44:55 PM  I have reviewed the labs performed to date as well as medications administered while in observation.  Plan  Current plan is for psychiatric inpatient hospitalization.  Glyn Ade, MD 06/16/22 [phone]

## 2022-06-17 ENCOUNTER — Encounter (HOSPITAL_COMMUNITY): Payer: Self-pay

## 2022-06-17 ENCOUNTER — Other Ambulatory Visit: Payer: Self-pay

## 2022-06-17 ENCOUNTER — Emergency Department (HOSPITAL_COMMUNITY)
Admission: EM | Admit: 2022-06-17 | Discharge: 2022-06-18 | Disposition: A | Discharge status: Home / Self Care | Payer: Medicaid Other | Source: Home / Self Care | Attending: Emergency Medicine | Admitting: Emergency Medicine

## 2022-06-17 DIAGNOSIS — X58XXXA Exposure to other specified factors, initial encounter: Secondary | ICD-10-CM | POA: Insufficient documentation

## 2022-06-17 DIAGNOSIS — F250 Schizoaffective disorder, bipolar type: Secondary | ICD-10-CM

## 2022-06-17 DIAGNOSIS — S90822A Blister (nonthermal), left foot, initial encounter: Secondary | ICD-10-CM

## 2022-06-17 MED ORDER — PALIPERIDONE ER 3 MG PO TB24: 3.0000 mg | ORAL_TABLET | Freq: Every day | ORAL | 0 refills | Status: DC

## 2022-06-17 NOTE — Progress Notes (Signed)
LCSW Progress Note  [id number]   [name]  06/17/2022  1:04 AM    Inpatient Behavioral Health Placement  At the request of psych provider Ophelia Shoulder, NP pt has been sent to out of network providers. Pt continues to meet inpatient behavioral health placement.  Destination  Service Provider Address Phone Fax  [facility]  [address] [place] [address] [phone]  [facility]  [address] [place] [address] [phone]  [facility] [address]  [address], [place] [address] [phone]  [facility]  [address] [place], [place] [address] [phone]  [facility] [address] [place]  [address] [place] [address] [phone]  [facility] [place] [address] [phone]  [facility]  [address] [place] [address] [phone]  [facility]  [address] [place], [place] [address] [phone]  [facility]  [address] [place] [address] [phone]  [facility]  [address], [place] [address] [phone]  [facility]  [address] [place] [address] [phone]  [facility]  [address] [place] [address] [phone]  [facility]  [address] [place] [address] [phone]  [facility]  [address], [place] [address] [phone]  [facility]  [address] [place] [address] [phone]  [facility]  [address] [place] [address] [phone]  [facility]  [address] [place] [address] [phone]  [facility]  [address] [place] [address] [phone]  [facility]  [address] [place] [address] [phone]  [facility]  [address], [place] [address] [phone]  [facility]  [address] [place] [address] [phone]  [facility]  [address] [place] [address] [phone]  [facility]  [address] [place] [address] [phone]  [facility]  [address] [place], [place] [address] [phone]  [facility]  [address] [place] [address] [phone]  [facility]  [address], [place] [address] [phone]  [facility]  [address] [place] [address] [phone]  [facility]  [address] [place] [address] [phone]  [facility]  [address] [place] [address] [phone]    Situation ongoing,  CSW will follow up.    [name], MSW, LCSWA 06/17/2022 1:04 AM

## 2022-06-17 NOTE — Progress Notes (Signed)
This CSW received a phone call from Sabina with [facility] informing that pt was under review PENDING the following items: IVC paperwork, Labs, medical clearance notes, and psych provider documentation. CSW informed care team and requested assistance from nursing to fax the IVC paperwork to  [phone], since it is not scanned into pt's chart. CSW will continue to assist with inpatient behavioral health placement.   [name], MSW, [facility] 06/17/2022 3:29 PM

## 2022-06-17 NOTE — Progress Notes (Signed)
TOC CSW was asked to provide pt with shelter resources.  Shelter and social service resources was shared with pt via AVS.  Insurance underwriter, MSW, LCSW-A Pronouns:  She/Her/Hers [facility] Clinical Social Worker Direct Number:  [phone] [email or web address]
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
## 2022-06-17 NOTE — ED Notes (Signed)
Meal provided 

## 2022-06-17 NOTE — ED Notes (Signed)
Pt is resting in bed, but will wake up and talk when spoken to. Vitals obtained. Pt denies any distress or pain.

## 2022-06-17 NOTE — Consult Note (Signed)
BH ED ASSESSMENT   Reason for Consult:  SI/HI, hallucinations,  Referring Physician:  Army Melia, PA-C Patient Identification: [name] MRN:  [id number] ED Chief Complaint: Schizoaffective disorder, bipolar type  Diagnosis:  Principal Problem:   Schizoaffective disorder, bipolar type Active Problems:   Substance-induced psychotic disorder   ED Assessment Time Calculation: Start Time: 1440 Stop Time: 1515 Total Time in Minutes (Assessment Completion): 35   HPI: Per Triage Note: Patient reports he tried to turn himself into the police for murder but they instead directed him to the ED for psychiatric evaluation. He states the person who he killed is still alive and is after him and has killed him multiple times. Also wants to be off the poisonous adderall and oxycodone he's been on for years. Endorses SI.    Subjective: Patient seen via telepsych by this provider, consulted with Dr. Lucianne Muss; and chart reviewed on 06/17/22.  On evaluation [name] reports that he is trying to get off the pills, and alcohol.  When provider asked him if he wanted to go into an substance abuse detox facility, he stated no.  Patient states that his appetite and sleep are fair.  He currently denies SI/HI/AVH, but states that he used to hear voices and feeling paranoid, but he states "I guess it comes from using the pills."  Patient is resting in his bed, upon evaluation he is he is alert and oriented, pleasant and cooperative during assessment.  Patient reports readiness to be sent to either behavioral health Hospital, in order to wait for his disability check that will arrive Wednesday, or he says he is ready to be discharged by the sheriff or even to his home or to the [facility], states he cannot go back to the [facility].   Patient presents with euthymic mood, congruent affect.  Patient reports history of schizoaffective disorder, bipolar type, polysubstance use disorder including  alcohol use.  Patient denies suicidal, homicidal ideations today, he is able to contract verbally for safety with this Clinical research associate.  He denies auditory and visual hallucinations. Objectively there is no evidence of psychosis/mania or delusional thinking.  Patient is able to converse coherently, goal directed thoughts, no distractibility, or pre-occupation. He also denies suicidal/self-harm/homicidal ideation, psychosis, and paranoia.    Patient has been followed by [facility] however fired ACT team 2 years ago. Patient endorses history of multiple previous inpatient psychiatric hospitalizations.  Most recent hospitalization approximately 3 years ago at [facility] behavioral health.  Patient understands that he needs to stop using alcohol and opiates, because it does increase his stress.  Patient informed provider that he can either stay with his mother and [facility] [place], or he will go to [facility] and stay.   Patient offered support and encouragement.  He declines any person to contact for collateral information at this time.  Attempted to call patient's mother [name] through his contact number, number does not work.   Patient educated and verbalizes understanding of mental health resources and other crisis services in the community. They are instructed to call 911 and present to the nearest emergency room should patient experience any suicidal/homicidal ideation, auditory/visual/hallucinations, or detrimental worsening of mental health condition.       Past Psychiatric History: schizoaffective disorder, polysubstance d/o   Risk to Self or Others: Is the patient at risk to self? No Has the patient been a risk to self in the past 6 months? No Has the patient been a risk  to self within the distant past? No Is the patient a risk to others? No Has the patient been a risk to others in the past 6 months? No Has the patient been a risk to others within the distant past?  No  [place] Scale:  Flowsheet Row ED from 06/15/2022 in [facility] Emergency Department at [facility] ED from 04/08/2022 in [facility] ED from 01/22/2022 in [facility] Emergency Department at [facility]  C-SSRS RISK CATEGORY No Risk Error: Question 6 not populated No Risk       AIMS:  , , ,  ,   ASAM:    Substance Abuse:     Past Medical History:  Past Medical History:  Diagnosis Date   Anxiety    Bipolar 1 disorder    Schizoaffective disorder, bipolar type    Stroke     Past Surgical History:  Procedure Laterality Date   CHOLECYSTECTOMY N/A 12/24/2012   Procedure: LAPAROSCOPIC CHOLECYSTECTOMY;  Surgeon: Cherylynn Ridges, MD;  Location: [facility] OR;  Service: General;  Laterality: N/A;   FINGER SURGERY     Family History:  Family History  Problem Relation Age of Onset   Heart disease Mother     Social History:  Social History   Substance and Sexual Activity  Alcohol Use Yes   Alcohol/week: 20.0 standard drinks of alcohol   Types: 20 Cans of beer per week   Comment: BAC was clear     Social History   Substance and Sexual Activity  Drug Use No   Types: Marijuana   Comment: UDS was clear    Social History   Socioeconomic History   Marital status: Single    Spouse name: Not on file   Number of children: Not on file   Years of education: Not on file   Highest education level: Not on file  Occupational History   Not on file  Tobacco Use   Smoking status: Every Day    Packs/day: 1.00    Years: 5.00    Additional pack years: 0.00    Total pack years: 5.00    Types: Cigarettes   Smokeless tobacco: Never  Substance and Sexual Activity   Alcohol use: Yes    Alcohol/week: 20.0 standard drinks of alcohol    Types: 20 Cans of beer per week    Comment: BAC was clear   Drug use: No    Types: Marijuana    Comment: UDS was clear   Sexual activity: Never    Birth control/protection: Condom  Other Topics Concern   Not  on file  Social History Narrative   Not on file   Social Determinants of Health   Financial Resource Strain: Not on file  Food Insecurity: Not on file  Transportation Needs: Not on file  Physical Activity: Not on file  Stress: Not on file  Social Connections: Not on file      Allergies:  No Known Allergies  Labs: No results found for this or any previous visit (from the past 48 hour(s)).  Current Facility-Administered Medications  Medication Dose Route Frequency Provider Last Rate Last Admin   ibuprofen (ADVIL) tablet 600 mg  600 mg Oral Q8H PRN Army Melia A, PA-C       melatonin tablet 3 mg  3 mg Oral QHS Army Melia A, PA-C       nicotine (NICODERM CQ - dosed in mg/24 hours) patch 21 mg  21 mg Transdermal Daily  Army Melia A, PA-C   21 mg at 06/17/22 1000   ondansetron (ZOFRAN) tablet 4 mg  4 mg Oral Q8H PRN Jeannie Fend, PA-C       paliperidone (INVEGA) 24 hr tablet 3 mg  3 mg Oral Daily Eligha Bridegroom, NP   3 mg at 06/17/22 1042   Current Outpatient Medications  Medication Sig Dispense Refill   hydrOXYzine (ATARAX) 25 MG tablet Take 1 tablet (25 mg total) by mouth 3 (three) times daily as needed for anxiety. (Patient not taking: Reported on 06/16/2022) 30 tablet 0   melatonin 5 MG TABS Take 1 tablet (5 mg total) by mouth at bedtime as needed. (Patient not taking: Reported on 06/16/2022) 30 tablet 0   OLANZapine (ZYPREXA) 5 MG tablet Take 1 tablet (5 mg total) by mouth at bedtime. (Patient not taking: Reported on 06/16/2022) 30 tablet 0    Musculoskeletal: Strength & Muscle Tone: within normal limits Gait & Station: normal Patient leans: N/A   Psychiatric Specialty Exam: Presentation  General Appearance:  Fairly Groomed  Eye Contact: Fair  Speech: Pressured  Speech Volume: Normal  Handedness: Right   Mood and Affect  Mood: Euthymic  Affect: Blunt   Thought Process  Thought Processes: Disorganized  Descriptions of  Associations:Circumstantial  Orientation:Full (Time, Place and Person)  Thought Content:Paranoid Ideation; Illogical  History of Schizophrenia/Schizoaffective disorder:Yes  Duration of Psychotic Symptoms:Greater than six months  Hallucinations:No data recorded Ideas of Reference:None  Suicidal Thoughts:No data recorded Homicidal Thoughts:No data recorded  Sensorium  Memory: Immediate Fair; Recent Fair  Judgment: Impaired  Insight: Lacking   Executive Functions  Concentration: Fair  Attention Span: Fair  Recall: [employer] of Knowledge: Fair  Language: Fair   Psychomotor Activity  Psychomotor Activity:No data recorded  Assets  Assets: Desire for Improvement; Leisure Time; Physical Health; Resilience    Sleep  Sleep:No data recorded  Physical Exam: Physical Exam Vitals and nursing note reviewed. Exam conducted with a chaperone present.  Eyes:     Pupils: Pupils are equal, round, and reactive to light.  Musculoskeletal:        General: Normal range of motion.     Cervical back: Normal range of motion.  Neurological:     Mental Status: He is alert.  Psychiatric:        Mood and Affect: Mood normal.        Behavior: Behavior normal.        Thought Content: Thought content normal.        Judgment: Judgment normal.    Review of Systems  Constitutional: Negative.   Musculoskeletal: Negative.   Skin: Negative.   Psychiatric/Behavioral: Negative.     Blood pressure (!) 89/61, pulse 75, temperature 98.7 F (37.1 C), temperature source Oral, resp. rate 14, SpO2 96 %. There is no height or weight on file to calculate BMI.    Medical Decision Making: Patient is psychiatrically cleared. Patient case review and discussed with Dr. Lucianne Muss, and patient does not meet inpatient criteria for inpatient psychiatric treatment. At time of discharge, patient denies SI, HI, AVH and can contract for safety. He demonstrated no overt evidence of psychosis or  mania. Prior to discharge, he verbalized that they understood warning signs, triggers, and symptoms of worsening mental health and how to access emergency mental health care if they felt it was needed. Patient was instructed to call 911 or return to the emergency room if they experienced any concerning symptoms after discharge. Patient voiced understanding and  agreed to the above.  Patient given resources to follow up with behavioral health urgent care for therapy and medication management. Patient denies access to weapons.  EDP will rescind patient's IVC.  Patient given a 14-day supply of his Invega 3 mg, also given resources to behavioral health urgent care, and information to get in contact with his ACT team.  This service was provided via telemedicine using a 2-way, interactive audio and video technology.   Names of all persons participating in this telemedicine service and their role in this encounter. Name: Eliud Polo Role: Patient  Name: Nelly Rout Role: Psychaitrist  Name: Alona Bene Role: PMHNP     Disposition: Patient does not meet criteria for psychiatric inpatient admission. Supportive therapy provided about ongoing stressors. Discussed crisis plan, support from social network, calling 911, coming to the Emergency Department, and calling Suicide Hotline.  Alona Bene, PMHNP 06/17/2022 3:17 PM

## 2022-06-17 NOTE — ED Provider Notes (Signed)
Emergency Medicine Observation Re-evaluation Note  [name] is a 52 y.o. male, seen on rounds today.  Pt initially presented to the ED for complaints of Homicidal and Psychosis Currently, the patient is delusional, but calm currently.  Physical Exam  BP (!) 89/61 (BP Location: Left Arm)   Pulse 75   Temp 98.7 F (37.1 C) (Oral)   Resp 14   SpO2 96%  Physical Exam General: No distress Cardiac: Regular rate and rhythm Lungs: No increased work of breathing Psych: Calm  ED Course / MDM  EKG:EKG Interpretation  Date/Time:  Tuesday June 15 2022 15:34:42 EDT Ventricular Rate:  78 PR Interval:  164 QRS Duration: 86 QT Interval:  370 QTC Calculation: 421 R Axis:   2 Text Interpretation: Normal sinus rhythm Normal ECG When compared with ECG of 09-Apr-2022 07:04, PREVIOUS ECG IS PRESENT no stemi Confirmed by Tanda Rockers ([id number]) on 06/15/2022 4:44:55 PM  I have reviewed the labs performed to date as well as medications administered while in observation.  Recent changes in the last 24 hours include none.  Plan  Current plan is for placement.    Gerhard Munch, MD 06/17/22 [phone]

## 2022-06-17 NOTE — Discharge Instructions (Addendum)

## 2022-06-17 NOTE — Progress Notes (Signed)
Per Alona Bene, PMHNP pt will be psych cleared. This CSW will now remove pt from the [facility] shift report. TOC to assist with any identified discharge needs.  [name], MSW, [facility] 06/17/2022 3:55 PM

## 2022-06-18 ENCOUNTER — Encounter (HOSPITAL_COMMUNITY): Payer: Self-pay

## 2022-06-18 ENCOUNTER — Other Ambulatory Visit: Payer: Self-pay

## 2022-06-18 ENCOUNTER — Ambulatory Visit (HOSPITAL_COMMUNITY)
Admission: EM | Admit: 2022-06-18 | Discharge: 2022-06-18 | Disposition: A | Discharge status: Home / Self Care | Payer: Medicaid Other | Attending: Nurse Practitioner | Admitting: Nurse Practitioner

## 2022-06-18 ENCOUNTER — Emergency Department (HOSPITAL_COMMUNITY)
Admission: EM | Admit: 2022-06-18 | Discharge: 2022-06-18 | Disposition: A | Discharge status: Home / Self Care | Payer: Medicaid Other | Attending: Student | Admitting: Student

## 2022-06-18 DIAGNOSIS — Z5900 Homelessness unspecified: Secondary | ICD-10-CM | POA: Insufficient documentation

## 2022-06-18 DIAGNOSIS — F1124 Opioid dependence with opioid-induced mood disorder: Secondary | ICD-10-CM | POA: Insufficient documentation

## 2022-06-18 DIAGNOSIS — Z765 Malingerer [conscious simulation]: Secondary | ICD-10-CM

## 2022-06-18 DIAGNOSIS — F17210 Nicotine dependence, cigarettes, uncomplicated: Secondary | ICD-10-CM | POA: Insufficient documentation

## 2022-06-18 DIAGNOSIS — F1129 Opioid dependence with unspecified opioid-induced disorder: Secondary | ICD-10-CM

## 2022-06-18 DIAGNOSIS — R7309 Other abnormal glucose: Secondary | ICD-10-CM | POA: Diagnosis not present

## 2022-06-18 DIAGNOSIS — F39 Unspecified mood [affective] disorder: Secondary | ICD-10-CM | POA: Insufficient documentation

## 2022-06-18 DIAGNOSIS — F1911 Other psychoactive substance abuse, in remission: Secondary | ICD-10-CM | POA: Insufficient documentation

## 2022-06-18 LAB — CBG MONITORING, ED: Glucose-Capillary: 122 mg/dL — ABNORMAL HIGH (ref 70–99)

## 2022-06-18 NOTE — ED Notes (Signed)
Pt arrived to room ambulatory with steady gait dressed in purple as he once again professes to be suicidal.Patient is homeless was seen and cleared by psych at [facility] and discharged at 0230 this morning. Pt then called 911 several times in attempt to get arrested and taken to jail so he would have place to stay. When that did not work he called 911 stating he had taken8-10 oxys and drank a beer in attempt to kill himself. Pt now requesting psych care and rehab. Pt a/o x 4 respirations even and non labored vs wnl no smell of alcohol about his person pupils 4 round brisk

## 2022-06-18 NOTE — ED Notes (Signed)
Discharge papers and follow up instructions reviewed with pt. Pt provided with band aids and gauze for blister on foot

## 2022-06-18 NOTE — ED Triage Notes (Addendum)
Patient reports calling 911 multiple times to night to get police to arrest him. Reports he just found out he is homeless and was thinking about robbing someone.   Patient states "I want long term help for my alcohol and pills."   Reports taking 8-10 10 mg Oxycodone & 1 beer tonight around 0100. Patient alert & Oriented x 4 in triage. Pupils PERRLA 3  Patient also c/o bilateral foot pain, 7/10  Patient seen at [facility] tonight for same complaint, cleared by psych and discharged around 0230 for malingering & homelessness.

## 2022-06-18 NOTE — Discharge Instructions (Signed)

## 2022-06-18 NOTE — ED Provider Notes (Signed)
[place] EMERGENCY DEPARTMENT AT [facility] Provider Note  CSN: [id number] Arrival date & time: 06/18/22 4098  Chief Complaint(s) Psychiatric Evaluation and Drug Overdose  HPI [name] is a 52 y.o. male With PMH bipolar 1, schizoaffective disorder, polysubstance abuse, frequent hospital use for secondary gain who presents emergency department for evaluation of depression and reported possible drug overdose.  Patient was seen at the behavioral health urgent care at 2:30 in the morning this morning with complaints of homelessness and treatment for opioid use disorder.  He was ultimately psych cleared with concerns for malingering as he has been seen in the emergency department on 4/23, 4/24 and 4/25 and ultimately psych cleared with concern for using the hospital system as housing and shelter.  Today, it appears that the patient called 911 several times and attempt to be arrested and taken to jail so that he would have a place to stay.  He had reportedly taken 8 to 10 pills of oxycodone in an attempt to kill himself at 1 AM this morning, but the story appears to have changed on arrival to the emergency department here.  Here in the emergency department he declines taking anything for himself and states he is only here requesting long-term placement for opioid addiction.  He was showing no signs of intoxication or overdose at 230 this morning in the behavioral health urgent care or here in the emergency department.  He currently denies chest pain, shortness of breath, abdominal pain, nausea, vomiting or other systemic symptoms.  He does endorse a left foot blister which she was previously evaluated for yesterday at midnight at [facility].   Past Medical History Past Medical History:  Diagnosis Date   Anxiety    Bipolar 1 disorder (HCC)    Schizoaffective disorder, bipolar type (HCC)    Stroke [facility])    Patient Active Problem List   Diagnosis Date Noted   Substance-induced  psychotic disorder (HCC) 01/23/2022   Substance induced mood disorder (HCC) 03/31/2019   Cocaine abuse with cocaine-induced mood disorder (HCC) 04/08/2014   Alcohol abuse, episodic 04/07/2014   Alcohol dependence with alcohol-induced mood disorder (HCC)    Anxiety state, unspecified 08/31/2013   Schizoaffective disorder, bipolar type (HCC) 08/30/2013   Alcohol-induced mood disorder (HCC) 08/29/2013   Diarrhea 01/30/2013   Abdominal pain 12/18/2012   Compulsive tobacco user syndrome 09/20/2012   Home Medication(s) Prior to Admission medications   Medication Sig Start Date End Date Taking? Authorizing Provider  hydrOXYzine (ATARAX) 25 MG tablet Take 1 tablet (25 mg total) by mouth 3 (three) times daily as needed for anxiety. Patient not taking: Reported on 06/16/2022 01/24/22   Bing Neighbors, NP  melatonin 5 MG TABS Take 1 tablet (5 mg total) by mouth at bedtime as needed. Patient not taking: Reported on 06/16/2022 04/09/22   Lenard Lance, FNP  paliperidone (INVEGA) 3 MG 24 hr tablet Take 1 tablet (3 mg total) by mouth daily. 06/18/22   [place]ley[place], Ezra Sites, PMHNP  Past Surgical History Past Surgical History:  Procedure Laterality Date   CHOLECYSTECTOMY N/A 12/24/2012   Procedure: LAPAROSCOPIC CHOLECYSTECTOMY;  Surgeon: Cherylynn Ridges, MD;  Location: [facility] OR;  Service: General;  Laterality: N/A;   FINGER SURGERY     Family History Family History  Problem Relation Age of Onset   Heart disease Mother     Social History Social History   Tobacco Use   Smoking status: Every Day    Packs/day: 1.00    Years: 5.00    Additional pack years: 0.00    Total pack years: 5.00    Types: Cigarettes   Smokeless tobacco: Never  Substance Use Topics   Alcohol use: Yes    Alcohol/week: 20.0 standard drinks of alcohol    Types: 20 Cans of beer per week     Comment: BAC was clear   Drug use: No    Types: Marijuana    Comment: UDS was clear   Allergies Patient has no known allergies.  Review of Systems Review of Systems  Skin:  Positive for wound.  Psychiatric/Behavioral:  Positive for behavioral problems.     Physical Exam Vital Signs  I have reviewed the triage vital signs BP 130/85 (BP Location: Right Arm)   Pulse 98   Temp 97.9 F (36.6 C)   Resp 17   Ht 5\' 9"  (1.753 m)   Wt 74.8 kg   SpO2 97%   BMI 24.37 kg/m   Physical Exam Constitutional:      General: He is not in acute distress.    Appearance: Normal appearance.  HENT:     Head: Normocephalic and atraumatic.     Nose: No congestion or rhinorrhea.  Eyes:     General:        Right eye: No discharge.        Left eye: No discharge.     Extraocular Movements: Extraocular movements intact.     Pupils: Pupils are equal, round, and reactive to light.  Cardiovascular:     Rate and Rhythm: Normal rate and regular rhythm.     Heart sounds: No murmur heard. Pulmonary:     Effort: No respiratory distress.     Breath sounds: No wheezing or rales.  Abdominal:     General: There is no distension.     Tenderness: There is no abdominal tenderness.  Musculoskeletal:        General: Normal range of motion.     Cervical back: Normal range of motion.  Skin:    General: Skin is warm and dry.     Comments: Left-sided foot blister  Neurological:     General: No focal deficit present.     Mental Status: He is alert.     ED Results and Treatments Labs (all labs ordered are listed, but only abnormal results are displayed) Labs Reviewed  CBG MONITORING, ED - Abnormal; Notable for the following components:      Result Value   Glucose-Capillary 122 (*)    All other components within normal limits  COMPREHENSIVE METABOLIC PANEL  ETHANOL  CBC  RAPID URINE DRUG SCREEN, HOSP PERFORMED  SALICYLATE LEVEL  ACETAMINOPHEN LEVEL  Radiology No results found.  Pertinent labs & imaging results that were available during my care of the patient were reviewed by me and considered in my medical decision making (see MDM for details).  Medications Ordered in ED Medications - No data to display                                                                                                                                   Procedures Procedures  (including critical care time)  Medical Decision Making / ED Course   This patient presents to the ED for concern of depression, homelessness, this involves an extensive number of treatment options, and is a complaint that carries with it a high risk of complications and morbidity.  The differential diagnosis includes homelessness, depression, overdose, psychosis, malingering, secondary gain  MDM: Patient seen emergency room for evaluation of depression and request for long-term placement for opioid dependence.  Physical exam with a small blister over the left foot but is otherwise unremarkable.  Patient has been seen and evaluated extensively at multiple emergency departments and across multiple psychiatric facilities.  Patient's behavior is highly concerning for frequent hospital use and secondary gain in order to obtain shelter and at this time does not present to the emergency department with any emergent medical complaints.  He is not homicidal, suicidal or complaining of auditory or visual hallucinations.  He is not displaying any evidence of psychosis.  At this time, patient does not meet inpatient hospital or psychiatric criteria and was discharged with resources to follow-up outpatient for opioid use disorder.   Additional history obtained:  -External records from outside source obtained and reviewed including: Chart review including previous notes, labs, imaging, consultation notes   Lab Tests: -I  ordered, reviewed, and interpreted labs.   The pertinent results include:   Labs Reviewed  CBG MONITORING, ED - Abnormal; Notable for the following components:      Result Value   Glucose-Capillary 122 (*)    All other components within normal limits  COMPREHENSIVE METABOLIC PANEL  ETHANOL  CBC  RAPID URINE DRUG SCREEN, HOSP PERFORMED  SALICYLATE LEVEL  ACETAMINOPHEN LEVEL      EKG   EKG Interpretation  Date/Time:  Friday June 18 2022 05:35:07 EDT Ventricular Rate:  95 PR Interval:  156 QRS Duration: 80 QT Interval:  348 QTC Calculation: 437 R Axis:   -10 Text Interpretation: Normal sinus rhythm Low voltage QRS Borderline ECG When compared with ECG of 15-Jun-2022 15:34, PREVIOUS ECG IS PRESENT Confirmed by Damarys Speir ([id number]) on 06/18/2022 5:49:58 AM          Medicines ordered and prescription drug management: No orders of the defined types were placed in this encounter.   -I have reviewed the patients home medicines and have made adjustments as needed  Critical interventions none    Cardiac Monitoring: The patient was maintained on a cardiac monitor.  I personally viewed  and interpreted the cardiac monitored which showed an underlying rhythm of: NSR  Social Determinants of Health:  Factors impacting patients care include: Homeless   Reevaluation: After the interventions noted above, I reevaluated the patient and found that they have :stayed the same  Co morbidities that complicate the patient evaluation  Past Medical History:  Diagnosis Date   Anxiety    Bipolar 1 disorder (HCC)    Schizoaffective disorder, bipolar type (HCC)    Stroke (HCC)       Dispostion: I considered admission for this patient, but he does not meet inpatient criteria for admission he was discharged with outpatient follow-up     Final Clinical Impression(s) / ED Diagnoses Final diagnoses:  None     @PCDICTATION @    Eann Cleland, Wyn Forster, MD 06/19/22 1133

## 2022-06-18 NOTE — Discharge Instructions (Signed)
They gave you a list of shelters that you could go to earlier today.  The behavioral health urgent care center can see you for mental health needs 24 hours a day 7 days a week.

## 2022-06-18 NOTE — ED Notes (Signed)
Pt medically cleared by md and dispo home with resources for shelters and follow up care

## 2022-06-18 NOTE — ED Provider Notes (Signed)
[place] EMERGENCY DEPARTMENT AT [facility] Provider Note   CSN: [id number] Arrival date & time: 06/17/22  2334     History  Chief Complaint  Patient presents with   Foot Pain   Suicidal    Pt bib ems for bilateral foot pain and SI with no plan. Pt was recently d/c from [facility], walked a good distance and called EMS, no injury.    RANNIE CRANEY is a 52 y.o. male.  52 yo M with a chief complaint of suicidal and homicidal ideation.  The patient tells me that he recently is homeless and he feels worthless and she is thinking about taking his own life and may be hurting some other people.  He denies any specific plan.  He has been walking quite a bit today and feels like both his feet hurt left worse than right.  Denies specific trauma.   Foot Pain       Home Medications Prior to Admission medications   Medication Sig Start Date End Date Taking? Authorizing Provider  hydrOXYzine (ATARAX) 25 MG tablet Take 1 tablet (25 mg total) by mouth 3 (three) times daily as needed for anxiety. Patient not taking: Reported on 06/16/2022 01/24/22   Bing Neighbors, NP  melatonin 5 MG TABS Take 1 tablet (5 mg total) by mouth at bedtime as needed. Patient not taking: Reported on 06/16/2022 04/09/22   Lenard Lance, FNP  paliperidone (INVEGA) 3 MG 24 hr tablet Take 1 tablet (3 mg total) by mouth daily. 06/18/22   [place], Ezra Sites, PMHNP      Allergies    Patient has no known allergies.    Review of Systems   Review of Systems  Physical Exam Updated Vital Signs BP (!) 109/91 (BP Location: Right Arm)   Pulse 76   Temp 97.7 F (36.5 C) (Oral)   Resp 18   SpO2 100%  Physical Exam Vitals and nursing note reviewed.  Constitutional:      Appearance: He is well-developed.  HENT:     Head: Normocephalic and atraumatic.  Eyes:     Pupils: Pupils are equal, round, and reactive to light.  Neck:     Vascular: No JVD.  Cardiovascular:     Rate and Rhythm: Normal rate and  regular rhythm.     Heart sounds: No murmur heard.    No friction rub. No gallop.  Pulmonary:     Effort: No respiratory distress.     Breath sounds: No wheezing.  Abdominal:     General: There is no distension.     Tenderness: There is no abdominal tenderness. There is no guarding or rebound.  Musculoskeletal:        General: Normal range of motion.     Cervical back: Normal range of motion and neck supple.     Comments: Patient has a small blister at the ball of the left foot.  Pulse motor and sensation intact bilateral lower extremities.  Skin:    Coloration: Skin is not pale.     Findings: No rash.  Neurological:     Mental Status: He is alert and oriented to person, place, and time.  Psychiatric:        Behavior: Behavior normal.     ED Results / Procedures / Treatments   Labs (all labs ordered are listed, but only abnormal results are displayed) Labs Reviewed - No data to display  EKG None  Radiology No results found.  Procedures Procedures  Medications Ordered in ED Medications - No data to display  ED Course/ Medical Decision Making/ A&P                             Medical Decision Making  52 yo M with a chief complaints of suicidal and homicidal ideation and bilateral foot pain.  Patient was discharged from the [facility] emergency department less than 6 hours ago.  He was cleared by psychiatry at that time.  I do not feel that this warrants another acute psychiatric evaluation.  I encouraged him to follow-up as an outpatient.  Given information for the behavioral health urgent care center as well.  12:12 AM:  I have discussed the diagnosis/risks/treatment options with the patient.  Evaluation and diagnostic testing in the emergency department does not suggest an emergent condition requiring admission or immediate intervention beyond what has been performed at this time.  They will follow up with PCP. We also discussed returning to the ED immediately if new  or worsening sx occur. We discussed the sx which are most concerning (e.g., sudden worsening pain, fever, inability to tolerate by mouth) that necessitate immediate return. Medications administered to the patient during their visit and any new prescriptions provided to the patient are listed below.  Medications given during this visit Medications - No data to display   The patient appears reasonably screen and/or stabilized for discharge and I doubt any other medical condition or other [facility] requiring further screening, evaluation, or treatment in the ED at this time prior to discharge.          Final Clinical Impression(s) / ED Diagnoses Final diagnoses:  Blister of left foot, initial encounter    Rx / DC Orders ED Discharge Orders     None         Melene Plan, DO 06/18/22 0012

## 2022-06-18 NOTE — ED Provider Notes (Signed)
Behavioral Health Urgent Care Medical Screening Exam  Patient Name: [name] MRN: [id number] Date of Evaluation: 06/18/22 Chief Complaint:   Diagnosis:  Final diagnoses:  Malingering  Homelessness    History of Present illness: [name] is a 52 y.o. male.  Psychiatric history of polysubstance abuse, alcohol induced mood disorder, he is affective disorder, bipolar type, and substance-induced mood disorder, who presented voluntarily to [facility] via [facility] with complaints of homelessness and seeking long term tx for opioid use disorder.  Patient was seen face-to-face by this provider and chart reviewed. Per chart review, Patient was seen at the ED 4/23, 4/24, 4/25 and psych cleared to follow up with outpatient psychiatric services. Patient was just seen and discharged from [facility] today at 0014 am, before presenting to [facility].   Per provider note at [facility] 06/17/22, "Patient has been followed by [facility] however fired ACT team 2 years ago. Patient endorses history of multiple previous inpatient psychiatric hospitalizations.  Most recent hospitalization approximately 3 years ago at [facility] behavioral health.  Patient understands that he needs to stop using alcohol and opiates, because it does increase his stress.  Patient informed provider that he can either stay with his mother and [facility], or he will go to [employer] and stay".    Tonight, patient reports "I'm just trying to get long-term treatment, I want to get off these pills, beer, and alcohol". Patient reports he wants to detox from oxycodone and Adderall.  Patient reports " I just quit marijuana and crack cocaine two and half months ago and I just found out I'm homeless, I used to live with my mom, and I went up there, and she said I can't come back to the house, can you at least let me stay here tonight? I'll leave early in the morning, it's late and it's cold outside, and I don't want to sleep outside, I'll make  arrangements tomorrow, I'll sleep in the woods if I have to ".  Patient denies SI/HI/AVH or paranoia.  Discussed options for substance abuse outpatient treatment programs, in addition to resources offered for temporary accomodation available at area homeless shelters.  Patient became irritated, stood up and states "you can call the officer to take me to jail, cos I don't have anywhere to sleep".  Patient declined to engage further in the evaluation, stating "I'm ready to go".   There is evidence of malingering due to homelessness, evident by multiple ED visits in the past 72 hours. Pt was observed overnight at [facility] and psych cleared 06/17/22.  Patient is using the ED's for secondary gain, specifically, housing/shelter, with hopes of long term stay/admission per his statement/requests. Patient showed no sign of mania or psychosis and has goal directed and coherent thought process throughout the assessment.    Patient's advised that repeated use/presentation to the Ed's/urgent care services due to homelessness, rather than seeking out recommended outpatient psychiatric services and community resources is ineffective to improving his mental health.  On evaluation, patient is alert, oriented x 4, and cooperative. Speech is clear and coherent. Pt appears disheveled. Eye contact is fair. Mood is euthymic, then became irritable, affect is congruent with mood. Thought process is goal directed, and thought content is WDL. Pt denies SI/HI/AVH or paranoia. There is no objective indication that the patient is responding to internal stimuli. No delusions elicited during this assessment.    Flowsheet Row ED from 06/18/2022 in [facility] ED from 06/17/2022 in [facility] at [facility] ED from 06/15/2022 in [facility] Emergency Department at [facility]  C-SSRS RISK CATEGORY No Risk Low Risk No Risk       Psychiatric Specialty Exam  Presentation   General Appearance:Disheveled  Eye Contact:Fair  Speech:Clear and Coherent  Speech Volume:Normal  Handedness:Right   Mood and Affect  Mood: Euthymic  Affect: Congruent   Thought Process  Thought Processes: Goal Directed  Descriptions of Associations:Intact  Orientation:Full (Time, Place and Person)  Thought Content:WDL  Diagnosis of Schizophrenia or Schizoaffective disorder in past: Yes  Duration of Psychotic Symptoms: Greater than six months  Hallucinations:None hearing voices but could not say what the voices were saying  Ideas of Reference:None  Suicidal Thoughts:No Without Plan  Homicidal Thoughts:No   Sensorium  Memory: Immediate Good  Judgment: Poor  Insight: Present   Executive Functions  Concentration: Good  Attention Span: Good  Recall: Good  Fund of Knowledge: Good  Language: Good   Psychomotor Activity  Psychomotor Activity: Normal   Assets  Assets: Communication Skills; Desire for Improvement   Sleep  Sleep: Fair  Number of hours: No data recorded  Physical Exam: Physical Exam Constitutional:      General: He is not in acute distress.    Appearance: He is not diaphoretic.  HENT:     Head: Normocephalic.     Right Ear: External ear normal.     Left Ear: External ear normal.     Nose: No congestion.  Eyes:     General:        Right eye: No discharge.        Left eye: No discharge.  Cardiovascular:     Rate and Rhythm: Normal rate.  Pulmonary:     Effort: No respiratory distress.  Chest:     Chest wall: No tenderness.  Neurological:     Mental Status: He is alert and oriented to person, place, and time.  Psychiatric:        Attention and Perception: Attention and perception normal.        Mood and Affect: Mood and affect normal.        Speech: Speech normal.        Behavior: Behavior is cooperative.        Thought Content: Thought content normal. Thought content is not paranoid or delusional.  Thought content does not include homicidal or suicidal ideation. Thought content does not include homicidal or suicidal plan.        Cognition and Memory: Cognition and memory normal.    Review of Systems  Constitutional:  Negative for chills, diaphoresis and fever.  HENT:  Negative for congestion.   Eyes:  Negative for discharge.  Respiratory:  Negative for cough, shortness of breath and wheezing.   Cardiovascular:  Negative for chest pain and palpitations.  Gastrointestinal:  Negative for diarrhea, nausea and vomiting.  Neurological:  Negative for dizziness, seizures, loss of consciousness, weakness and headaches.  Psychiatric/Behavioral: Negative.     Blood pressure 116/87, pulse 88, temperature 97.6 F (36.4 C), temperature source Oral, resp. rate 18, SpO2 100 %. There is no height or weight on file to calculate BMI.  Musculoskeletal: Strength & Muscle Tone: within normal limits Gait & Station: normal Patient leans: N/A   [facility] MSE Discharge Disposition for Follow up and Recommendations: Based on my evaluation the patient does not appear to have an emergency medical condition and can be discharged with resources and follow up care in  outpatient services for Substance Abuse Intensive Outpatient Program and Homeless/area shelter resources.  Patient denies SI/HI/AVH or paranoia. Patient is psych cleared at this time and does not meet inpatient psychiatric admission criteria or IVC criteria at this time there is no evidence of imminent risk.  Patient will benefit from outpatient psychiatric services, and resources was offered, which he is not receptive to, along with community resources for housing/homeless shelter prior to discharge.  Please refrain from using alcohol or illicit substances, as they can affect your mood and can cause depression, anxiety or other concerning symptoms.  Alcohol can increase the chance that a person will make reckless decisions, like attempting suicide,  and can increase the lethality of a drug overdose.    Discussed crisis plan, calling 911, going to the ED if condition changes or worsens.  Patient verbalizes his understanding.  Patient discharged and condition at discharge is stable.  Mancel Bale, NP 06/18/2022, 2:36 AM

## 2022-06-18 NOTE — Progress Notes (Signed)
   06/18/22 0219  [facility] Triage Screening (Walk-ins at [facility] only)  How Did You Hear About [place]? Legal System  What Is the Reason for Your Visit/Call Today? Pt presents to [facility] voluntarily, via [employer] and unaccompanied.  Pt reports he is seeking detox for alcohol and pills (oxycodone).  Pt denies SI, HI or AVH.  Pt reports he drank 2 beers and took three oxycodone today.  Pt admits to piror MH diagnosis; als reports taking prescribed medication.  Pt reports that he has [employer], "I don't know their name".  How Long Has This Been Causing You Problems? 1-6 months  Have You Recently Had Any Thoughts About Hurting Yourself? No  Are You Planning to Commit Suicide/Harm Yourself At This time? No  Have you Recently Had Thoughts About Hurting Someone [name]? No  Are You Planning To Harm Someone At This Time? No  Are you currently experiencing any auditory, visual or other hallucinations? No  Have You Used Any Alcohol or Drugs in the Past 24 Hours? Yes  How long ago did you use Drugs or Alcohol? 24 hours  What Did You Use and How Much? 2 beers - alcohol, 3 oxycodone pills  Do you have any current medical co-morbidities that require immediate attention? No  Clinician description of patient physical appearance/behavior: dishelved  What Do You Feel Would Help You the Most Today? Alcohol or Drug Use Treatment  If access to [facility] Urgent Care was not available, would you have sought care in the Emergency Department? Yes  Determination of Need Routine (7 days)  Options For Referral Outpatient Therapy;Chemical Dependency Intensive Outpatient Therapy (CDIOP)

## 2022-06-23 ENCOUNTER — Telehealth: Payer: Self-pay

## 2022-06-23 NOTE — Telephone Encounter (Signed)
This RNCM received voicemail message from patient requesting [facility]. Patient did not provide a phone number indicated that he is currently residing at [facility] in [place].   This RNCM called [facility] room#[phone]- [phone] spoke with patient who reports he [facility] Patient reports he really needs his medications and he is no longer living with his mother and can not pick his medications up from the [facility] [place]. Patient reports he will be at the [facility] all night tonight.   This RNCM [facility] [address] [phone] who reports they are not able to fill the patient's prescription Hinda Glatter) due to needs a PA with the insurance company. Per chart review patient has [place] Medicaid/ [employer].   TOC will continue to follow.

## 2022-06-23 NOTE — Telephone Encounter (Signed)
Late Documentation due to system error:  This RNCM spoke with [name] with Medicaid to complete PA for Invega (Paliperidone) 3mg  24 hour tablet #14. PA has been approved: PA# [id number] call refernce # [id number].  This is RNCM spoke with [facility]  [address] to provide PA#. Pharmacy reports the [facility] [place] is currently in the file and will contact them to input PA # to fill RX in [place].   No additional TOC needs, signing off.

## 2022-07-12 IMAGING — DX DG ABDOMEN 1V
1 series · 1 of 1 positions shown · non-contrast
Comparison: Abdominal radiograph dated 07/09/2021 and CT dated
07/09/2021

CLINICAL DATA: Constipation.

EXAM:
ABDOMEN - 1 VIEW

[abdomen kub]
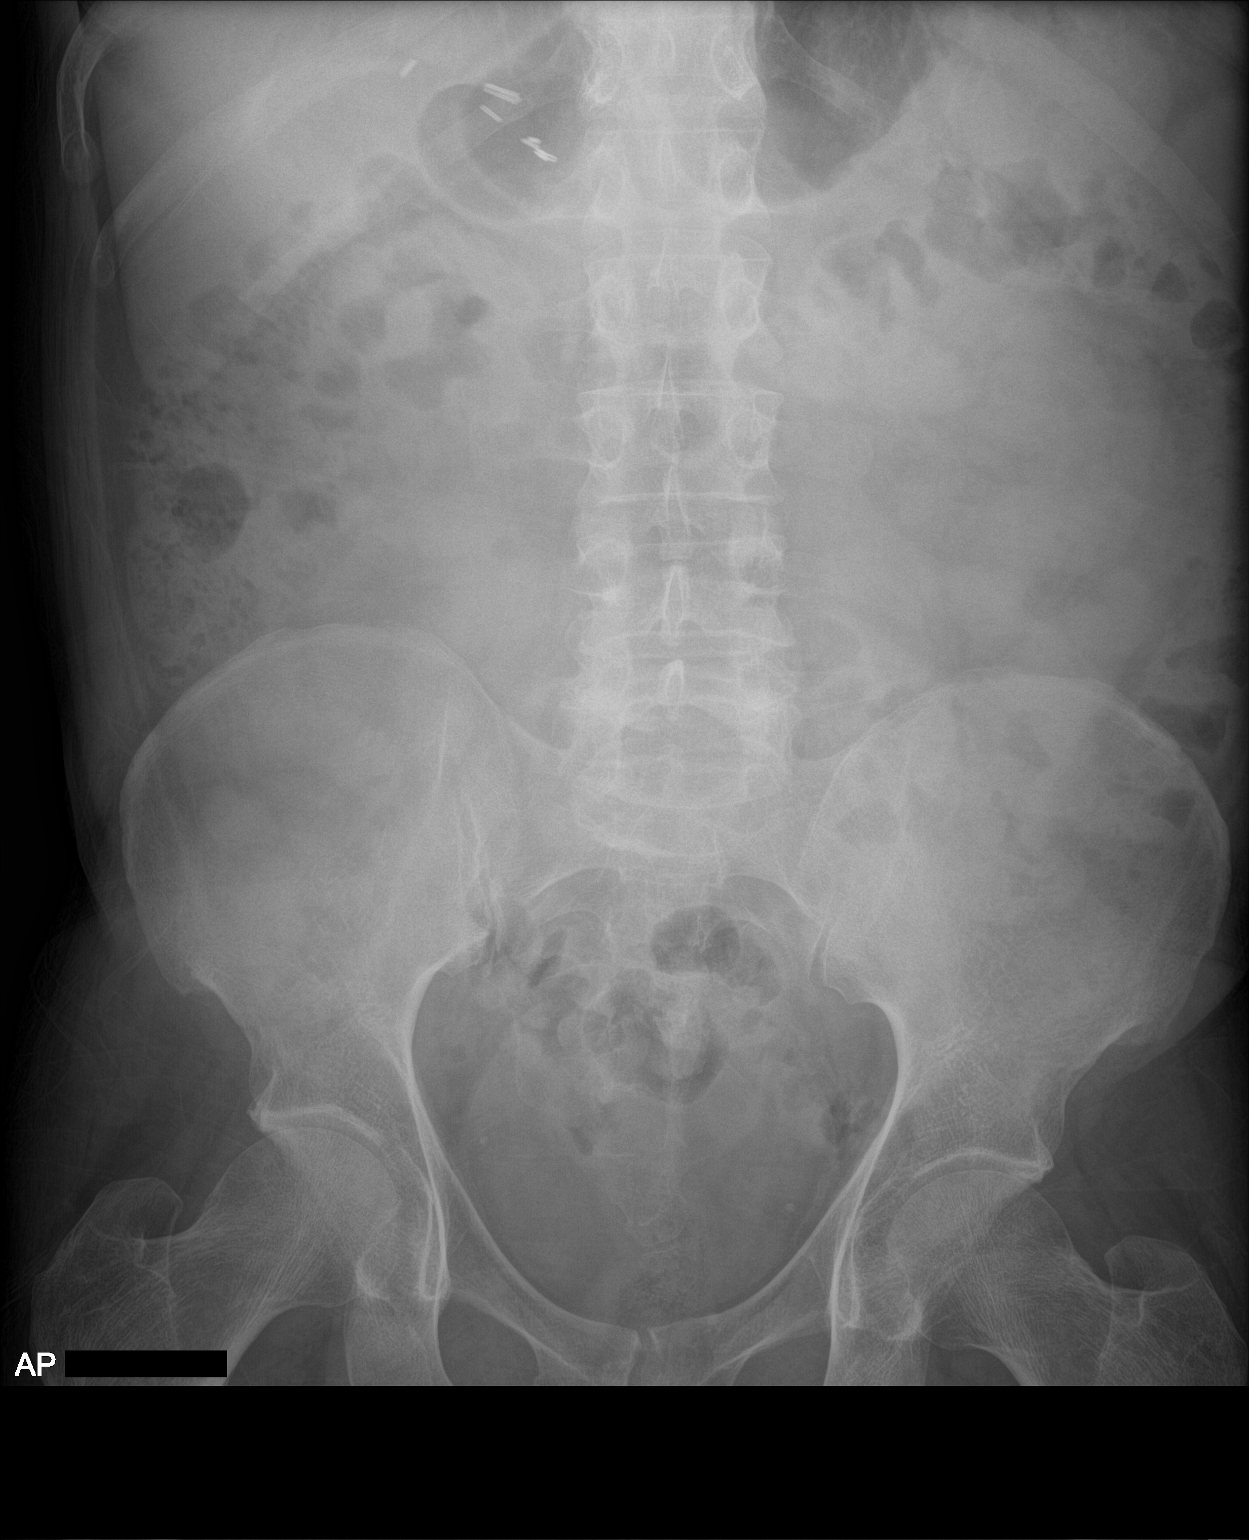

[1 of 1 positions shown; findings below may reference images not displayed]

FINDINGS: Mild colonic stool burden. No bowel dilatation or evidence of
obstruction. No free air or radiopaque calculi. Right upper quadrant
cholecystectomy clips. The osseous structures are intact the soft
tissues are unremarkable.
IMPRESSION: No bowel obstruction.  No significant stool burden.

## 2022-08-15 ENCOUNTER — Emergency Department (HOSPITAL_COMMUNITY)
Admission: EM | Admit: 2022-08-15 | Discharge: 2022-08-16 | Disposition: A | Discharge status: Home / Self Care | Payer: Medicaid Other | Attending: Emergency Medicine | Admitting: Emergency Medicine

## 2022-08-15 DIAGNOSIS — L03211 Cellulitis of face: Secondary | ICD-10-CM | POA: Diagnosis not present

## 2022-08-15 DIAGNOSIS — R45851 Suicidal ideations: Secondary | ICD-10-CM | POA: Insufficient documentation

## 2022-08-15 DIAGNOSIS — L0201 Cutaneous abscess of face: Secondary | ICD-10-CM | POA: Diagnosis present

## 2022-08-15 DIAGNOSIS — L0390 Cellulitis, unspecified: Secondary | ICD-10-CM

## 2022-08-15 LAB — COMPREHENSIVE METABOLIC PANEL
ALT: 21 U/L (ref 0–44)
AST: 21 U/L (ref 15–41)
Albumin: 3.8 g/dL (ref 3.5–5.0)
Alkaline Phosphatase: 75 U/L (ref 38–126)
Anion gap: 17 — ABNORMAL HIGH (ref 5–15)
BUN: 12 mg/dL (ref 6–20)
CO2: 18 mmol/L — ABNORMAL LOW (ref 22–32)
Calcium: 9.1 mg/dL (ref 8.9–10.3)
Chloride: 100 mmol/L (ref 98–111)
Creatinine, Ser: 1.16 mg/dL (ref 0.61–1.24)
GFR, Estimated: >60 mL/min (ref >60)
Glucose, Bld: 80 mg/dL (ref 70–99)
Potassium: 3.9 mmol/L (ref 3.5–5.1)
Sodium: 135 mmol/L (ref 135–145)
Total Bilirubin: 1.9 mg/dL — ABNORMAL HIGH (ref 0.3–1.2)
Total Protein: 7.4 g/dL (ref 6.5–8.1)

## 2022-08-15 LAB — RAPID URINE DRUG SCREEN, HOSP PERFORMED
Amphetamines: NOT DETECTED
Barbiturates: NOT DETECTED
Benzodiazepines: NOT DETECTED
Cocaine: NOT DETECTED
Opiates: NOT DETECTED
Tetrahydrocannabinol: NOT DETECTED

## 2022-08-15 LAB — SALICYLATE LEVEL: Salicylate Lvl: <7 mg/dL — ABNORMAL LOW (ref 7.0–30.0)

## 2022-08-15 LAB — CBC
HCT: 43.5 % (ref 39.0–52.0)
Hemoglobin: 14.9 g/dL (ref 13.0–17.0)
MCH: 33.1 pg (ref 26.0–34.0)
MCHC: 34.3 g/dL (ref 30.0–36.0)
MCV: 96.7 fL (ref 80.0–100.0)
Platelets: 242 10*3/uL (ref 150–400)
RBC: 4.5 MIL/uL (ref 4.22–5.81)
RDW: 12 % (ref 11.5–15.5)
WBC: 6.4 10*3/uL (ref 4.0–10.5)
nRBC: 0 % (ref 0.0–0.2)

## 2022-08-15 LAB — ACETAMINOPHEN LEVEL: Acetaminophen (Tylenol), Serum: <10 ug/mL — ABNORMAL LOW (ref 10–30)

## 2022-08-15 LAB — ETHANOL: Alcohol, Ethyl (B): <10 mg/dL (ref <10)

## 2022-08-15 MED ORDER — CLINDAMYCIN HCL 300 MG PO CAPS
300.0000 mg | ORAL_CAPSULE | Freq: Three times a day (TID) | ORAL | Status: DC
  Administered 2022-08-15: 300 mg via ORAL
  Filled 2022-08-15: qty 1

## 2022-08-15 MED ORDER — CLINDAMYCIN HCL 300 MG PO CAPS: 300.0000 mg | ORAL_CAPSULE | Freq: Three times a day (TID) | ORAL | 0 refills | Status: DC

## 2022-08-15 NOTE — ED Triage Notes (Signed)
[facility] reporting abscess to face near chin with drainage. Also reports suicidal thoughts with no plan after being off meds.

## 2022-08-15 NOTE — ED Provider Notes (Signed)
[place] EMERGENCY DEPARTMENT AT [facility] Provider Note   CSN: [id number] Arrival date & time: 08/15/22  1941     History Chief Complaint  Patient presents with   Abscess   Suicidal    HPI [name] is a 52 y.o. male presenting for chief complaint of abscess to his left chin. He is also endorsing suicidal thoughts no active plan at this time.  Has been seen extensively for similar behaviors over the last 6 months.  This is a 60 visit for combined psychiatric complaints.  Has been evaluated by psych recently and ultimately deemed to be stable for outpatient psychiatric referral..   Patient's recorded medical, surgical, social, medication list and allergies were reviewed in the Snapshot window as part of the initial history.   Review of Systems   Review of Systems  Constitutional:  Negative for chills and fever.  HENT:  Negative for ear pain and sore throat.   Eyes:  Negative for pain and visual disturbance.  Respiratory:  Negative for cough and shortness of breath.   Cardiovascular:  Negative for chest pain and palpitations.  Gastrointestinal:  Negative for abdominal pain and vomiting.  Genitourinary:  Negative for dysuria and hematuria.  Musculoskeletal:  Negative for arthralgias and back pain.  Skin:  Negative for color change and rash.  Neurological:  Negative for seizures and syncope.  All other systems reviewed and are negative.   Physical Exam Updated Vital Signs BP 96/68 (BP Location: Left Arm)   Pulse 82   Temp 98 F (36.7 C) (Oral)   Resp 18   SpO2 99%  Physical Exam Vitals and nursing note reviewed.  Constitutional:      General: He is not in acute distress.    Appearance: He is well-developed.  HENT:     Head: Normocephalic and atraumatic.  Eyes:     Conjunctiva/sclera: Conjunctivae normal.  Cardiovascular:     Rate and Rhythm: Normal rate and regular rhythm.     Heart sounds: No murmur heard. Pulmonary:     Effort: Pulmonary  effort is normal. No respiratory distress.     Breath sounds: Normal breath sounds.  Abdominal:     Palpations: Abdomen is soft.     Tenderness: There is no abdominal tenderness.  Musculoskeletal:        General: Deformity (1 cm soft tissue lesion on his chin.  It does not proceed to the submandibular space.) present. No swelling.     Cervical back: Neck supple.  Skin:    General: Skin is warm and dry.     Capillary Refill: Capillary refill takes less than 2 seconds.  Neurological:     Mental Status: He is alert.  Psychiatric:        Mood and Affect: Mood normal.      ED Course/ Medical Decision Making/ A&P    Procedures Ultrasound ED Soft Tissue  Date/Time: 08/15/2022 9:20 PM  Performed by: Glyn Ade, MD Authorized by: Glyn Ade, MD   Procedure details:    Indications: localization of abscess and evaluate for cellulitis     Transverse view:  Visualized   Images: archived     Limitations:  Body habitus Location:    Location: neck   Findings:     no abscess present    cellulitis present Comments:     No drainable fluid collection identified.  Posterior wall of induration/cellulitis appreciated.  Diffuse honeycombing.    Medications Ordered in ED Medications  clindamycin (CLEOCIN)  capsule 300 mg (300 mg Oral Given 08/15/22 2129)    Medical Decision Making:    [name] is a 52 y.o. male who presented to the ED today with 2 separate chief complaints.  First he is endorsing a soft tissue mass on his neck that he endorses is painful.  He has a history of substance use disorder and soft tissue abscesses. This lesion is already draining.  Ultrasound was performed at bedside demonstrating no residual fluid collection though he does have significant induration and cellulitis.  Will treat with clindamycin for broad coverage and recommend he follow-up with a primary care provider.  However, patient is stating that he is planning to hang himself with his  whip.    Vital signs reviewed and reassuring.  Reviewed and confirmed nursing documentation for past medical history, family history, social history.   Patient placed on continuous vitals and telemetry monitoring while in ED which was reviewed periodically.     Initial Assessment:   This is most consistent with an acute life threatening illness. With the patient's presentation of suicidal ideation history of self-harm, extensive additional medical history, patient warrants emergent psychiatric consultation.  Differential includes primary psychosis, substance-induced psychosis, mood disturbance.  Initial Plan:  Patient immediately placed into ED psychiatric hold protocol including suicide precautions, elopement precautions and vital sign monitoring.    Emergent behavioral health hold signed and notarized while awaiting psychiatric consultation due to threat to self or others.  Psychiatry consulted for further evaluation once patient medically cleared.  Medical screening evaluation ordered and reviewed with no obvious medical reason to postpone psychiatric evaluation.  Patient is voluntary at this time.  No IVC.  May need to be reassessed if capacity is changing.     Final Assessment and Plan:   Pending psychiatric recommendations at this time.  Patient medically cleared for psychiatric disposition.     Clinical Impression:  1. Cellulitis, unspecified cellulitis site   2. Suicidal ideation      Data Unavailable   Final Clinical Impression(s) / ED Diagnoses Final diagnoses:  Cellulitis, unspecified cellulitis site  Suicidal ideation    Rx / DC Orders ED Discharge Orders          Ordered    clindamycin (CLEOCIN) 300 MG capsule  3 times daily        08/15/22 2151              Glyn Ade, MD 08/15/22 2151

## 2022-08-15 NOTE — BH Assessment (Addendum)
Comprehensive Clinical Assessment (CCA) Note  08/15/2022 [name] [id number] Disposition: Clinician discussed patient care with NP [facility].  She recommended continuous assessment at [facility] for patient.  Clinician informed RN Ariel and Dr. Eudelia Bunch via secure messaging.  Patient is calm and cooperative during assessment.  He is oriented x4 and has normal eye contact.  Patient is not responding to internal stimuli nor does he evidence any delusional thought content.  Pt judgement is poor, impulse control is fair.  Pt reports appetite varies.    Pt said he used to have a ACTT team but could not recall which provider it was .  He has no current outpatient care.      Chief Complaint:  Chief Complaint  Patient presents with   Abscess   Suicidal   Visit Diagnosis: Bipolar d/o most recent episode depressed.    CCA Screening, Triage and Referral (STR)  Patient Reported Information How did you hear about [place]? Other (Comment) (Pt called 911 to get to [facility].)  What Is the Reason for Your Visit/Call Today? Pt came to [facility] via EMS.  Pt says he had gotten bitten by a spider on his chin about four days ago.  Pt has a plan to hang himself with his whip.  He says he has been off medications for "a long time."  Pt denies any previous suicide attempts.  No HI or A/V hallucination.  Pt denies any ETOH or other drug use, says "I'm a born again [place]."  .  When asked about access to guns he says "that's personal."  Pt says that his appetite varies.  Pt says he is homeless and sleeps under bridges.  He said that he used to have an ACTT team but he does not know what happened with them.  Patient will say  "that's personal" when he is unsure of how to answer an interrogative.  How Long Has This Been Causing You Problems? 1 wk - 1 month  What Do You Feel Would Help You the Most Today? Treatment for Depression or other mood problem; Medication(s)   Have You Recently Had Any Thoughts About Hurting  Yourself? Yes  Are You Planning to Commit Suicide/Harm Yourself At This time? Yes (Plan to hang himself w/ a whip.)   Flowsheet Row ED from 08/15/2022 in [facility] Emergency Department at [facility] Most recent reading at 08/15/2022  8:02 PM ED from 06/18/2022 in [facility] Emergency Department at [facility] Most recent reading at 06/18/2022  5:37 AM ED from 06/18/2022 in [facility] Most recent reading at 06/18/2022  2:27 AM  C-SSRS RISK CATEGORY High Risk Error: Q3, 4, or 5 should not be populated when Q2 is No No Risk       Have you Recently Had Thoughts About Hurting Someone [name]? No  Are You Planning to Harm Someone at This Time? No  Explanation: Pt told EDP that he had a plan to hang himself w/ a whip.  No HI.   Have You Used Any Alcohol or Drugs in the Past 24 Hours? No  What Did You Use and How Much? Pt denies use of ETOH or other drugs, due to religious reasons.   Do You Currently Have a Therapist/Psychiatrist? No  Name of Therapist/Psychiatrist: Name of Therapist/Psychiatrist: Claims he used to have an ACTT team but cannot recall who it was.   Have You Been Recently Discharged From Any Office Practice or Programs? No  Explanation of  Discharge From Practice/Program: No discharges     CCA Screening Triage Referral Assessment Type of Contact: Tele-Assessment  Telemedicine Service Delivery:   Is this Initial or Reassessment? Is this Initial or Reassessment?: Initial Assessment  Date Telepsych consult ordered in [facility]:  Date Telepsych consult ordered in [facility]: 08/15/22  Time Telepsych consult ordered in [facility]:  Time Telepsych consult ordered in [facility]: 2120  Location of Assessment: [facility] ED  Provider [facility]   Collateral Involvement: None available   Does Patient Have a Automotive engineer Guardian? No  Legal Guardian Contact Information: Pt does not have a legal guardian.  Copy of Legal  Guardianship Form: -- (Pt does not have a legal guardian.)  Legal Guardian Notified of Arrival: -- (Pt does not have a legal guardian.)  Legal Guardian Notified of Pending Discharge: -- (Pt does not have a legal guardian.)  If Minor and Not Living with Parent(s), Who has Custody? Pt is an adult  Is CPS involved or ever been involved? Never  Is APS involved or ever been involved? Never   Patient Determined To Be At Risk for Harm To Self or Others Based on Review of Patient Reported Information or Presenting Complaint? Yes, for Self-Harm  Method: Plan without intent  Availability of Means: No access or NA  Intent: Vague intent or NA  Notification Required: -- (No HI.)  Additional Information for Danger to Others Potential: -- (No HI.)  Additional Comments for Danger to Others Potential: Pt denies any HI.  Are There Guns or Other Weapons in Your Home? No  Types of Guns/Weapons: NOne  Are These Weapons Safely Secured?                            No  Who Could Verify You Are Able To Have These Secured: No weapons to secure.  Do You Have any Outstanding Charges, Pending Court Dates, Parole/Probation? No charges.  Contacted To Inform of Risk of Harm To Self or Others: Other: Comment (Pt has no contacts to contact.)    Does Patient Present under Involuntary Commitment? No    [place] of Residence: [place] (Homeless in [place])   Patient Currently Receiving the Following Services: Not Receiving Services   Determination of Need: Urgent (48 hours)   Options For Referral: [facility] (Observation at [facility]     CCA Biopsychosocial Patient Reported Schizophrenia/Schizoaffective Diagnosis in Past: Yes   Strengths: Motivated to receive help.  Wants to get back on medications.   Mental Health Symptoms Depression:   Hopelessness; Fatigue; Difficulty Concentrating; Change in energy/activity   Duration of Depressive symptoms:  Duration of Depressive  Symptoms: Greater than two weeks   Mania:   None   Anxiety:    Worrying; Tension; Difficulty concentrating   Psychosis:   None   Duration of Psychotic symptoms:    Trauma:   None   Obsessions:   None   Compulsions:   None   Inattention:   N/A   Hyperactivity/Impulsivity:   N/A   Oppositional/Defiant Behaviors:   None   Emotional Irregularity:   Recurrent suicidal behaviors/gestures/threats; Mood lability   Other Mood/Personality Symptoms:   Bipolar d/o    Mental Status Exam Appearance and self-care  Stature:   Average   Weight:   Average weight   Clothing:   Casual (In scrubs)   Grooming:   Neglected   Cosmetic use:   None   Posture/gait:  Other (Comment) (Pt was laying prone.)   Motor activity:   Not Remarkable   Sensorium  Attention:   Normal   Concentration:   Scattered   Orientation:   X5   Recall/memory:   Defective in Recent   Affect and Mood  Affect:   Appropriate; Full Range   Mood:   Depressed   Relating  Eye contact:   Normal   Facial expression:   Responsive   Attitude toward examiner:   Cooperative; Guarded   Thought and Language  Speech flow:  Normal   Thought content:   Appropriate to Mood and Circumstances   Preoccupation:   None   Hallucinations:   None   Organization:   Linear   Company secretary of Knowledge:   Average   Intelligence:   Average   Abstraction:   Normal   Judgement:   Fair   Dance movement psychotherapist:   Adequate   Insight:   Fair   Decision Making:   Normal   Social Functioning  Social Maturity:   Isolates   Social Judgement:   Normal   Stress  Stressors:   Housing; Office manager Ability:   Deficient supports   Skill Deficits:   None   Supports:   Support needed     Religion: Religion/Spirituality Are You A Religious Person?: Yes What is Your Religious Affiliation?: Christian How Might This Affect Treatment?: No affect on  treatment  Leisure/Recreation: Leisure / Recreation Do You Have Hobbies?: No  Exercise/Diet: Exercise/Diet Do You Exercise?: No Have You Gained or Lost A Significant Amount of Weight in the Past Six Months?: No Do You Follow a Special Diet?: No Do You Have Any Trouble Sleeping?: Yes Explanation of Sleeping Difficulties: Pt is homeless and does not always have consistent place to stay.   CCA Employment/Education Employment/Work Situation: Employment / Work Systems developer: On disability Why is Patient on Disability: Due to mental health How Long has Patient Been on Disability: Unknown Patient's Job has Been Impacted by Current Illness: No Has Patient ever Been in the [employer]?: No  Education: Education Is Patient Currently Attending School?: No Last Grade Completed: 10 Did You Attend College?: No Did You Have An Individualized Education Program (IIEP): No Did You Have Any Difficulty At School?: No Patient's Education Has Been Impacted by Current Illness: No   CCA Family/Childhood History Family and Relationship History: Family history Marital status: Single Does patient have children?: No  Childhood History:  Childhood History By whom was/is the patient raised?:  ("I kinda raised myself.") Did patient suffer any verbal/emotional/physical/sexual abuse as a child?: No ("That's personal.") Did patient suffer from severe childhood neglect?: No Has patient ever been sexually abused/assaulted/raped as an adolescent or adult?: No Was the patient ever a victim of a crime or a disaster?: No Witnessed domestic violence?: No Has patient been affected by domestic violence as an adult?: No       CCA Substance Use Alcohol/Drug Use: Alcohol / Drug Use Pain Medications: No pain meds Prescriptions: Pt says he has been off meds "for a long time" and wants to get back on them. Over the Counter: None History of alcohol / drug use?:  (Pt says he is not using ETOH  or other substances.) Longest period of sobriety (when/how long): Unknown Negative Consequences of Use:  (N/A)                         ASAM's:  Six Dimensions of Multidimensional Assessment  Dimension 1:  Acute Intoxication and/or Withdrawal Potential:      Dimension 2:  Biomedical Conditions and Complications:      Dimension 3:  Emotional, Behavioral, or Cognitive Conditions and Complications:     Dimension 4:  Readiness to Change:     Dimension 5:  Relapse, Continued use, or Continued Problem Potential:     Dimension 6:  Recovery/Living Environment:     ASAM Severity Score:    ASAM Recommended Level of Treatment:     Substance use Disorder (SUD)    Recommendations for Services/Supports/Treatments:    Discharge Disposition:    DSM5 Diagnoses: Patient Active Problem List   Diagnosis Date Noted   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   Substance induced mood disorder (HCC) 03/31/2019   Cocaine abuse with cocaine-induced mood disorder (HCC) 04/08/2014   Alcohol abuse, episodic 04/07/2014   Alcohol dependence with alcohol-induced mood disorder (HCC)    Anxiety state, unspecified 08/31/2013   Schizoaffective disorder, bipolar type (HCC) 08/30/2013   Alcohol-induced mood disorder (HCC) 08/29/2013   Diarrhea 01/30/2013   Abdominal pain 12/18/2012   Compulsive tobacco user syndrome 09/20/2012     Referrals to Alternative Service(s): Referred to Alternative Service(s):   Place:   Date:   Time:    Referred to Alternative Service(s):   Place:   Date:   Time:    Referred to Alternative Service(s):   Place:   Date:   Time:    Referred to Alternative Service(s):   Place:   Date:   Time:     Wandra Mannan

## 2022-08-15 NOTE — ED Notes (Signed)
Pt has been dressed out and urine, blood work and EKG has been obtained. Pt has two belongings bags and one backpack that have been placed on the top shelf of the cabinet labeled "Pt Belongings [address]. Security has been called to wand the patient.

## 2022-08-16 ENCOUNTER — Encounter (HOSPITAL_COMMUNITY): Payer: Self-pay | Admitting: Registered Nurse

## 2022-08-16 ENCOUNTER — Ambulatory Visit (HOSPITAL_COMMUNITY)
Admission: EM | Admit: 2022-08-16 | Discharge: 2022-08-16 | Disposition: A | Discharge status: Home / Self Care | Payer: Medicaid Other | Attending: Nurse Practitioner | Admitting: Nurse Practitioner

## 2022-08-16 ENCOUNTER — Other Ambulatory Visit: Payer: Self-pay

## 2022-08-16 DIAGNOSIS — Z765 Malingerer [conscious simulation]: Secondary | ICD-10-CM

## 2022-08-16 DIAGNOSIS — F250 Schizoaffective disorder, bipolar type: Secondary | ICD-10-CM | POA: Insufficient documentation

## 2022-08-16 DIAGNOSIS — L0201 Cutaneous abscess of face: Secondary | ICD-10-CM | POA: Insufficient documentation

## 2022-08-16 DIAGNOSIS — Z5900 Homelessness unspecified: Secondary | ICD-10-CM

## 2022-08-16 DIAGNOSIS — R45851 Suicidal ideations: Secondary | ICD-10-CM

## 2022-08-16 DIAGNOSIS — Z560 Unemployment, unspecified: Secondary | ICD-10-CM | POA: Insufficient documentation

## 2022-08-16 MED ORDER — HYDROXYZINE HCL 25 MG PO TABS: 25.0000 mg | ORAL_TABLET | Freq: Three times a day (TID) | ORAL | Status: DC | PRN

## 2022-08-16 MED ORDER — ACETAMINOPHEN 325 MG PO TABS: 650.0000 mg | ORAL_TABLET | Freq: Four times a day (QID) | ORAL | Status: DC | PRN

## 2022-08-16 MED ORDER — CLINDAMYCIN HCL 150 MG PO CAPS
300.0000 mg | ORAL_CAPSULE | Freq: Three times a day (TID) | ORAL | Status: DC
  Administered 2022-08-16: 300 mg via ORAL
  Filled 2022-08-16: qty 2

## 2022-08-16 MED ORDER — ALUM & MAG HYDROXIDE-SIMETH 200-200-20 MG/5ML PO SUSP: 30.0000 mL | ORAL | Status: DC | PRN

## 2022-08-16 MED ORDER — MAGNESIUM HYDROXIDE 400 MG/5ML PO SUSP: 30.0000 mL | Freq: Every day | ORAL | Status: DC | PRN

## 2022-08-16 MED ORDER — TRAZODONE HCL 50 MG PO TABS: 50.0000 mg | ORAL_TABLET | Freq: Every evening | ORAL | Status: DC | PRN

## 2022-08-16 NOTE — ED Notes (Signed)
Pt A&O x 4, transfer from Dr [facility], presents with suicidal ideations, plan to hang self with his Whip. Noncompliant with meds.  Pt eval for abscess at [facility], treated with Clindamycin.  Denies HI or AVH.  Comfort measures given, monitoring for safety.

## 2022-08-16 NOTE — ED Provider Notes (Signed)
[name]  Date: 08/16/22 Patient Name: [name] MRN: [id number] Chief Complaint: suicidal ideation  Diagnoses:  Final diagnoses:  Schizoaffective disorder, bipolar type (HCC)  Suicidal ideation    [name] is a 52 y/o male with a history of schizoaffectve disorder, bipolar type, substance induced psychosis, alcohol induced mood disorder,  presenting to [facility] voluntarily after presenting to [facility] ED with an abscess to his left chin. Patient was given a prescription for clindamycin 300 mg capsule to treat his abscess. Patient also endorsed suicidal ideation with a plan to hang himself with a whip. Patient was transferred to [facility] continuous assessment for overnight observation.  Nurse practitioner assessed patient face to face and reviewed his chart. Patient has had several ED visits with similar presentations. Patient is alert oriented x4, speech is clear and coherent and goal directed, thought process is linear, patient is guarded, mood is anxious and irritable with congruent affect. Patient continues to endorse suicidal ideation. Patient does not appear to be responding to any internal or external stimuli, no mania, psychosis, distractibility or preoccupation, denies paranoia, auditory or visual hallucinations.   Patient states that he wants to get back on his medications. Patient is a poor historian and not able to remember the name of previous psychiatric provider or when he was last on medications. When asked how long he has been off his medications he stated he did not remember. Patient reports that he was previously on an injection and he thinks it was invega sustenna. Patient wants to restart his invega injection. NP offered to restart patient on invega tablets, but patient refused. NP explained that patient can be given community resources to get established with outpatient medication management since invega sustenna  injection is not available here at [facility].     .  Patient is not able to contract for safety and will be admitted to [facility] for crisis management, stability and safety.   Total Time spent with patient: 20 minutes  Musculoskeletal  Strength & Muscle Tone: within normal limits Gait & Station: normal Patient leans: N/A  Psychiatric Specialty Exam  Presentation General Appearance:  Casual  Eye Contact: Fleeting  Speech: Clear and Coherent  Speech Volume: Normal  Handedness: Right   Mood and Affect  Mood: Euthymic  Affect: Congruent   Thought Process  Thought Processes: Coherent  Descriptions of Associations:Intact  Orientation:Full (Time, Place and Person)  Thought Content:WDL  Diagnosis of Schizophrenia or Schizoaffective disorder in past: Yes  Duration of Psychotic Symptoms: Greater than six months  Hallucinations:Hallucinations: None  Ideas of Reference:None  Suicidal Thoughts:Suicidal Thoughts: No  Homicidal Thoughts:Homicidal Thoughts: No   Sensorium  Memory: Immediate Poor; Recent Poor; Remote Poor  Judgment: Poor  Insight: Present   Executive Functions  Concentration: Fair  Attention Span: Fair  Recall: [employer] of Knowledge: Fair  Language: Fair   Psychomotor Activity  Psychomotor Activity: Psychomotor Activity: Normal   Assets  Assets: Communication Skills; Resilience; Physical Health   Sleep  Sleep: Sleep: Fair Number of Hours of Sleep: -1   Nutritional Assessment (For OBS and FBC admissions only) Has the patient had a weight loss or gain of 10 pounds or more in the last 3 months?: No Has the patient had a decrease in food intake/or appetite?: No Does the patient have dental problems?: No Does the patient have eating habits or behaviors that may be indicators of an eating disorder including binging or inducing  vomiting?: No Has the patient recently lost weight without trying?: 0 Has the patient been  eating poorly because of a decreased appetite?: 0 Malnutrition Screening Tool Score: 0    Physical Exam Constitutional:      Appearance: Normal appearance.  HENT:     Head: Normocephalic and atraumatic.     Nose: Nose normal.  Eyes:     Pupils: Pupils are equal, round, and reactive to light.  Cardiovascular:     Rate and Rhythm: Normal rate.  Pulmonary:     Effort: Pulmonary effort is normal.  Abdominal:     General: Abdomen is flat.  Musculoskeletal:        General: Normal range of motion.     Cervical back: Normal range of motion.  Skin:    General: Skin is warm.  Neurological:     Mental Status: He is alert and oriented to person, place, and time.  Psychiatric:        Attention and Perception: Attention normal.        Mood and Affect: Mood is anxious. Affect is flat and angry.        Speech: Speech normal.        Behavior: Behavior is agitated.        Thought Content: Thought content normal.        Cognition and Memory: Cognition normal.    Review of Systems  Constitutional: Negative.   HENT: Negative.    Eyes: Negative.   Respiratory: Negative.    Cardiovascular: Negative.   Gastrointestinal: Negative.   Genitourinary: Negative.   Musculoskeletal: Negative.   Skin: Negative.   Neurological: Negative.   Endo/Heme/Allergies: Negative.   Psychiatric/Behavioral:  Positive for depression. The patient is nervous/anxious.     Blood pressure 108/70, pulse 74, temperature 97.7 F (36.5 C), temperature source Oral, resp. rate 18, SpO2 98 %. There is no height or weight on file to calculate BMI.  Past Psychiatric History: [facility]  Is the patient at risk to self? No  Has the patient been a risk to self in the past 6 months? No .    Has the patient been a risk to self within the distant past? No   Is the patient a risk to others? No   Has the patient been a risk to others in the past 6 months? No   Has the patient been a risk to others within the distant past? No    Past Medical History: abdominal pain, stroke, substance induced psychosis, alcohol induced mood disorder, schizoaffectve disorder  Family History: Unknown  Social History: Unemployed, homeless, polysubstance abuse, opioid abuse  Last Labs:  Admission on 08/15/2022, Discharged on 08/16/2022  Component Date Value Ref Range Status   Sodium 08/15/2022 135  135 - 145 mmol/L Final   Potassium 08/15/2022 3.9  3.5 - 5.1 mmol/L Final   Chloride 08/15/2022 100  98 - 111 mmol/L Final   CO2 08/15/2022 18 (L)  22 - 32 mmol/L Final   Glucose, Bld 08/15/2022 80  70 - 99 mg/dL Final   Glucose reference range applies only to samples taken after fasting for at least 8 hours.   BUN 08/15/2022 12  6 - 20 mg/dL Final   Creatinine, Ser 08/15/2022 1.16  0.61 - 1.24 mg/dL Final   Calcium 16/11/9602 9.1  8.9 - 10.3 mg/dL Final   Total Protein 54/10/8117 7.4  6.5 - 8.1 g/dL Final   Albumin 14/78/2956 3.8  3.5 - 5.0 g/dL Final   AST  08/15/2022 21  15 - 41 U/L Final   ALT 08/15/2022 21  0 - 44 U/L Final   Alkaline Phosphatase 08/15/2022 75  38 - 126 U/L Final   Total Bilirubin 08/15/2022 1.9 (H)  0.3 - 1.2 mg/dL Final   GFR, Estimated 08/15/2022 >60  >60 mL/min Final   Comment: (NOTE) Calculated using the CKD-EPI Creatinine Equation (2021)    Anion gap 08/15/2022 17 (H)  5 - 15 Final   Performed at [facility], [address] [place], [place] [address]   Alcohol, Ethyl (B) 08/15/2022 <10  <10 mg/dL Final   Comment: (NOTE) Lowest detectable limit for serum alcohol is 10 mg/dL.  For medical purposes only. Performed at [facility], [address] [place], [place] [address]    Salicylate Lvl 08/15/2022 <7.0 (L)  7.0 - 30.0 mg/dL Final   Performed at [facility], [address], [place], [place] [address]   Acetaminophen (Tylenol), Serum 08/15/2022 <10 (L)  10 - 30 ug/mL Final   Comment: (NOTE) Therapeutic concentrations vary significantly. A  range of 10-30 ug/mL  may be an effective concentration for many patients. However, some  are best treated at concentrations outside of this range. Acetaminophen concentrations >150 ug/mL at 4 hours after ingestion  and >50 ug/mL at 12 hours after ingestion are often associated with  toxic reactions.  Performed at [facility], [address], [place], [place] [address]    WBC 08/15/2022 6.4  4.0 - 10.5 K/uL Final   RBC 08/15/2022 4.50  4.22 - 5.81 MIL/uL Final   Hemoglobin 08/15/2022 14.9  13.0 - 17.0 g/dL Final   HCT 29/56/2130 43.5  39.0 - 52.0 % Final   MCV 08/15/2022 96.7  80.0 - 100.0 fL Final   MCH 08/15/2022 33.1  26.0 - 34.0 pg Final   MCHC 08/15/2022 34.3  30.0 - 36.0 g/dL Final   RDW 86/57/8469 12.0  11.5 - 15.5 % Final   Platelets 08/15/2022 242  150 - 400 K/uL Final   nRBC 08/15/2022 0.0  0.0 - 0.2 % Final   Performed at [facility], [address], [place], [place] [address]   Opiates 08/15/2022 NONE DETECTED  NONE DETECTED Final   Cocaine 08/15/2022 NONE DETECTED  NONE DETECTED Final   Benzodiazepines 08/15/2022 NONE DETECTED  NONE DETECTED Final   Amphetamines 08/15/2022 NONE DETECTED  NONE DETECTED Final   Tetrahydrocannabinol 08/15/2022 NONE DETECTED  NONE DETECTED Final   Barbiturates 08/15/2022 NONE DETECTED  NONE DETECTED Final   Comment: (NOTE) DRUG SCREEN FOR MEDICAL PURPOSES ONLY.  IF CONFIRMATION IS NEEDED FOR ANY PURPOSE, NOTIFY LAB WITHIN 5 DAYS.  LOWEST DETECTABLE LIMITS FOR URINE DRUG SCREEN Drug Class                     Cutoff (ng/mL) Amphetamine and metabolites    1000 Barbiturate and metabolites    200 Benzodiazepine                 200 Opiates and metabolites        300 Cocaine and metabolites        300 THC                            50 Performed at [facility], [address] [place], [place] [address]   Admission on 06/18/2022, Discharged on 06/18/2022  Component Date  Value  Ref Range Status   Glucose-Capillary 06/18/2022 122 (H)  70 - 99 mg/dL Final   Glucose reference range applies only to samples taken after fasting for at least 8 hours.  Admission on 06/15/2022, Discharged on 06/17/2022  Component Date Value Ref Range Status   SARS Coronavirus 2 by RT PCR 06/15/2022 NEGATIVE  NEGATIVE Final   Performed at [facility], [address], [place], [place] [address]   Sodium 06/15/2022 136  135 - 145 mmol/L Final   Potassium 06/15/2022 4.0  3.5 - 5.1 mmol/L Final   Chloride 06/15/2022 104  98 - 111 mmol/L Final   CO2 06/15/2022 22  22 - 32 mmol/L Final   Glucose, Bld 06/15/2022 123 (H)  70 - 99 mg/dL Final   Glucose reference range applies only to samples taken after fasting for at least 8 hours.   BUN 06/15/2022 8  6 - 20 mg/dL Final   Creatinine, Ser 06/15/2022 0.91  0.61 - 1.24 mg/dL Final   Calcium 32/35/5732 9.3  8.9 - 10.3 mg/dL Final   Total Protein 20/25/4270 7.5  6.5 - 8.1 g/dL Final   Albumin 62/37/6283 4.3  3.5 - 5.0 g/dL Final   AST 15/17/6160 29  15 - 41 U/L Final   ALT 06/15/2022 33  0 - 44 U/L Final   Alkaline Phosphatase 06/15/2022 75  38 - 126 U/L Final   Total Bilirubin 06/15/2022 0.9  0.3 - 1.2 mg/dL Final   GFR, Estimated 06/15/2022 >60  >60 mL/min Final   Comment: (NOTE) Calculated using the CKD-EPI Creatinine Equation (2021)    Anion gap 06/15/2022 10  5 - 15 Final   Performed at [facility], [address] [place], [place] [address]   Alcohol, Ethyl (B) 06/15/2022 <10  <10 mg/dL Final   Comment: (NOTE) Lowest detectable limit for serum alcohol is 10 mg/dL.  For medical purposes only. Performed at [facility], [address] [place], [place] [address]    Opiates 06/15/2022 NONE DETECTED  NONE DETECTED Final   Cocaine 06/15/2022 NONE DETECTED  NONE DETECTED Final   Benzodiazepines 06/15/2022 NONE DETECTED  NONE DETECTED Final   Amphetamines 06/15/2022 NONE DETECTED  NONE DETECTED Final    Tetrahydrocannabinol 06/15/2022 NONE DETECTED  NONE DETECTED Final   Barbiturates 06/15/2022 NONE DETECTED  NONE DETECTED Final   Comment: (NOTE) DRUG SCREEN FOR MEDICAL PURPOSES ONLY.  IF CONFIRMATION IS NEEDED FOR ANY PURPOSE, NOTIFY LAB WITHIN 5 DAYS.  LOWEST DETECTABLE LIMITS FOR URINE DRUG SCREEN Drug Class                     Cutoff (ng/mL) Amphetamine and metabolites    1000 Barbiturate and metabolites    200 Benzodiazepine                 200 Opiates and metabolites        300 Cocaine and metabolites        300 THC                            50 Performed at [facility], [address] [place], [place] [address]    WBC 06/15/2022 7.2  4.0 - 10.5 K/uL Final   RBC 06/15/2022 4.77  4.22 - 5.81 MIL/uL Final   Hemoglobin 06/15/2022 15.8  13.0 - 17.0 g/dL Final   HCT 70/35/0093 45.3  39.0 - 52.0 %  Final   MCV 06/15/2022 95.0  80.0 - 100.0 fL Final   MCH 06/15/2022 33.1  26.0 - 34.0 pg Final   MCHC 06/15/2022 34.9  30.0 - 36.0 g/dL Final   RDW 16/11/9602 12.1  11.5 - 15.5 % Final   Platelets 06/15/2022 248  150 - 400 K/uL Final   nRBC 06/15/2022 0.0  0.0 - 0.2 % Final   Neutrophils Relative % 06/15/2022 66  % Final   Neutro Abs 06/15/2022 4.8  1.7 - 7.7 K/uL Final   Lymphocytes Relative 06/15/2022 26  % Final   Lymphs Abs 06/15/2022 1.8  0.7 - 4.0 K/uL Final   Monocytes Relative 06/15/2022 7  % Final   Monocytes Absolute 06/15/2022 0.5  0.1 - 1.0 K/uL Final   Eosinophils Relative 06/15/2022 1  % Final   Eosinophils Absolute 06/15/2022 0.0  0.0 - 0.5 K/uL Final   Basophils Relative 06/15/2022 0  % Final   Basophils Absolute 06/15/2022 0.0  0.0 - 0.1 K/uL Final   Immature Granulocytes 06/15/2022 0  % Final   Abs Immature Granulocytes 06/15/2022 0.02  0.00 - 0.07 K/uL Final   Performed at [facility], [address], [place], [place] [address]   Acetaminophen (Tylenol), Serum 06/15/2022 <10 (L)  10 - 30 ug/mL Final   Comment: (NOTE) Therapeutic concentrations  vary significantly. A range of 10-30 ug/mL  may be an effective concentration for many patients. However, some  are best treated at concentrations outside of this range. Acetaminophen concentrations >150 ug/mL at 4 hours after ingestion  and >50 ug/mL at 12 hours after ingestion are often associated with  toxic reactions.  Performed at [facility], [address], [place], [place] [address]    Salicylate Lvl 06/15/2022 <7.0 (L)  7.0 - 30.0 mg/dL Final   Performed at [facility], [address] [place], [place] [address]   HIV-1 P24 Antigen - HIV24 06/15/2022 NON REACTIVE  NON REACTIVE Final   Comment: (NOTE) Detection of p24 may be inhibited by biotin in the sample, causing false negative results in acute infection.    HIV 1/2 Antibodies 06/15/2022 NON REACTIVE  NON REACTIVE Final   Interpretation (HIV Ag Ab) 06/15/2022 A non reactive test result means that HIV 1 or HIV 2 antibodies and HIV 1 p24 antigen were not detected in the specimen.   Final   Performed at [facility], [address], [place], [place] [address]  Admission on 04/08/2022, Discharged on 04/09/2022  Component Date Value Ref Range Status   SARS Coronavirus 2 by RT PCR 04/08/2022 NEGATIVE  NEGATIVE Final   Influenza A by PCR 04/08/2022 NEGATIVE  NEGATIVE Final   Influenza B by PCR 04/08/2022 NEGATIVE  NEGATIVE Final   Comment: (NOTE) The Xpert Xpress SARS-CoV-2/FLU/RSV plus assay is intended as an aid in the diagnosis of influenza from Nasopharyngeal swab specimens and should not be used as a sole basis for treatment. Nasal washings and aspirates are unacceptable for Xpert Xpress SARS-CoV-2/FLU/RSV testing.  Fact Sheet for Patients: BloggerCourse.com  Fact Sheet for Healthcare Providers: SeriousBroker.it  This test is not yet approved or cleared by the [place] FDA and has been authorized for detection and/or diagnosis of SARS-CoV-2  by FDA under an Emergency Use Authorization (EUA). This EUA will remain in effect (meaning this test can be used) for the duration of the COVID-19 declaration under Section 564(b)(1) of the Act, 21 U.S.C. section 360bbb-3(b)(1), unless the authorization is terminated  or revoked.     Resp Syncytial Virus by PCR 04/08/2022 NEGATIVE  NEGATIVE Final   Comment: (NOTE) Fact Sheet for Patients: BloggerCourse.com  Fact Sheet for Healthcare Providers: SeriousBroker.it  This test is not yet approved or cleared by the [place] FDA and has been authorized for detection and/or diagnosis of SARS-CoV-2 by FDA under an Emergency Use Authorization (EUA). This EUA will remain in effect (meaning this test can be used) for the duration of the COVID-19 declaration under Section 564(b)(1) of the Act, 21 U.S.C. section 360bbb-3(b)(1), unless the authorization is terminated or revoked.  Performed at [facility], [address], [place], [place] [address]    WBC 04/08/2022 10.5  4.0 - 10.5 K/uL Final   RBC 04/08/2022 4.92  4.22 - 5.81 MIL/uL Final   Hemoglobin 04/08/2022 16.4  13.0 - 17.0 g/dL Final   HCT 60/45/4098 46.2  39.0 - 52.0 % Final   MCV 04/08/2022 93.9  80.0 - 100.0 fL Final   MCH 04/08/2022 33.3  26.0 - 34.0 pg Final   MCHC 04/08/2022 35.5  30.0 - 36.0 g/dL Final   RDW 11/91/4782 12.6  11.5 - 15.5 % Final   Platelets 04/08/2022 245  150 - 400 K/uL Final   nRBC 04/08/2022 0.0  0.0 - 0.2 % Final   Neutrophils Relative % 04/08/2022 70  % Final   Neutro Abs 04/08/2022 7.3  1.7 - 7.7 K/uL Final   Lymphocytes Relative 04/08/2022 23  % Final   Lymphs Abs 04/08/2022 2.4  0.7 - 4.0 K/uL Final   Monocytes Relative 04/08/2022 7  % Final   Monocytes Absolute 04/08/2022 0.8  0.1 - 1.0 K/uL Final   Eosinophils Relative 04/08/2022 0  % Final   Eosinophils Absolute 04/08/2022 0.0  0.0 - 0.5 K/uL Final   Basophils Relative 04/08/2022 0  %  Final   Basophils Absolute 04/08/2022 0.0  0.0 - 0.1 K/uL Final   Immature Granulocytes 04/08/2022 0  % Final   Abs Immature Granulocytes 04/08/2022 0.03  0.00 - 0.07 K/uL Final   Performed at [facility], [address] [place], [place] [address]   Sodium 04/08/2022 139  135 - 145 mmol/L Final   Potassium 04/08/2022 4.9  3.5 - 5.1 mmol/L Final   Chloride 04/08/2022 101  98 - 111 mmol/L Final   CO2 04/08/2022 26  22 - 32 mmol/L Final   Glucose, Bld 04/08/2022 114 (H)  70 - 99 mg/dL Final   Glucose reference range applies only to samples taken after fasting for at least 8 hours.   BUN 04/08/2022 <5 (L)  6 - 20 mg/dL Final   Creatinine, Ser 04/08/2022 0.88  0.61 - 1.24 mg/dL Final   Calcium 30/86/5784 10.4 (H)  8.9 - 10.3 mg/dL Final   Total Protein 69/62/9528 7.4  6.5 - 8.1 g/dL Final   Albumin 41/32/4401 4.3  3.5 - 5.0 g/dL Final   AST 02/72/5366 23  15 - 41 U/L Final   ALT 04/08/2022 34  0 - 44 U/L Final   Alkaline Phosphatase 04/08/2022 71  38 - 126 U/L Final   Total Bilirubin 04/08/2022 0.3  0.3 - 1.2 mg/dL Final   GFR, Estimated 04/08/2022 >60  >60 mL/min Final   Comment: (NOTE) Calculated using the CKD-EPI Creatinine Equation (2021)    Anion gap 04/08/2022 12  5 - 15 Final   Performed at [facility], [address], [place], [place] [address]   Alcohol, Ethyl (  B) 04/08/2022 <10  <10 mg/dL Final   Comment: (NOTE) Lowest detectable limit for serum alcohol is 10 mg/dL.  For medical purposes only. Performed at [facility], [address] [place], [place] [address]    Cholesterol 04/08/2022 173  0 - 200 mg/dL Final   Triglycerides 60/45/4098 284 (H)  <150 mg/dL Final   HDL 11/91/4782 63  >40 mg/dL Final   Total CHOL/HDL Ratio 04/08/2022 2.7  RATIO Final   VLDL 04/08/2022 57 (H)  0 - 40 mg/dL Final   LDL Cholesterol 04/08/2022 53  0 - 99 mg/dL Final   Comment:        Total Cholesterol/HDL:CHD Risk Coronary Heart Disease Risk Table                      Men   Women  1/2 Average Risk   3.4   3.3  Average Risk       5.0   4.4  2 X Average Risk   9.6   7.1  3 X Average Risk  23.4   11.0        Use the calculated Patient Ratio above and the CHD Risk Table to determine the patient's CHD Risk.        ATP III CLASSIFICATION (LDL):  <100     mg/dL   Optimal  956-213  mg/dL   Near or Above                    Optimal  130-159  mg/dL   Borderline  086-578  mg/dL   High  >469     mg/dL   Very High Performed at [facility], [address], [place], [place] [address]    POC Amphetamine UR 04/08/2022 None Detected  NONE DETECTED (Cut Off Level 1000 ng/mL) Final   POC Secobarbital (BAR) 04/08/2022 None Detected  NONE DETECTED (Cut Off Level 300 ng/mL) Final   POC Buprenorphine (BUP) 04/08/2022 None Detected  NONE DETECTED (Cut Off Level 10 ng/mL) Final   POC Oxazepam (BZO) 04/08/2022 None Detected  NONE DETECTED (Cut Off Level 300 ng/mL) Final   POC Cocaine UR 04/08/2022 None Detected  NONE DETECTED (Cut Off Level 300 ng/mL) Final   POC Methamphetamine UR 04/08/2022 None Detected  NONE DETECTED (Cut Off Level 1000 ng/mL) Final   POC Morphine 04/08/2022 None Detected  NONE DETECTED (Cut Off Level 300 ng/mL) Final   POC Methadone UR 04/08/2022 None Detected  NONE DETECTED (Cut Off Level 300 ng/mL) Final   POC Oxycodone UR 04/08/2022 None Detected  NONE DETECTED (Cut Off Level 100 ng/mL) Final   POC Marijuana UR 04/08/2022 None Detected  NONE DETECTED (Cut Off Level 50 ng/mL) Final   SARSCOV2ONAVIRUS 2 AG 04/08/2022 NEGATIVE  NEGATIVE Final   Comment: (NOTE) SARS-CoV-2 antigen NOT DETECTED.   Negative results are presumptive.  Negative results do not preclude SARS-CoV-2 infection and should not be used as the sole basis for treatment or other patient management decisions, including infection  control decisions, particularly in the presence of clinical signs and  symptoms consistent with COVID-19, or in those who have been in contact with  the virus.  Negative results must be combined with clinical observations, patient history, and epidemiological information. The expected result is Negative.  Fact Sheet for Patients: https://www.jennings-kim.com/  Fact Sheet for Healthcare Providers: https://alexander-rogers.biz/  This test is not yet approved or cleared by the [place] FDA and  has been  authorized for detection and/or diagnosis of SARS-CoV-2 by FDA under an Emergency Use Authorization (EUA).  This EUA will remain in effect (meaning this test can be used) for the duration of  the COV                          ID-19 declaration under Section 564(b)(1) of the Act, 21 U.S.C. section 360bbb-3(b)(1), unless the authorization is terminated or revoked sooner.     TSH 04/08/2022 1.960  0.350 - 4.500 uIU/mL Final   Comment: Performed by a 3rd Generation assay with a functional sensitivity of <=0.01 uIU/mL. Performed at [facility], [address] [place], [place] [address]     Allergies: Patient has no known allergies.  Medications:  Facility Ordered Medications  Medication   acetaminophen (TYLENOL) tablet 650 mg   alum & mag hydroxide-simeth (MAALOX/MYLANTA) 200-200-20 MG/5ML suspension 30 mL   magnesium hydroxide (MILK OF MAGNESIA) suspension 30 mL   traZODone (DESYREL) tablet 50 mg   hydrOXYzine (ATARAX) tablet 25 mg   clindamycin (CLEOCIN) capsule 300 mg   PTA Medications  Medication Sig   hydrOXYzine (ATARAX) 25 MG tablet Take 1 tablet (25 mg total) by mouth 3 (three) times daily as needed for anxiety. (Patient not taking: Reported on 06/16/2022)   melatonin 5 MG TABS Take 1 tablet (5 mg total) by mouth at bedtime as needed. (Patient not taking: Reported on 06/16/2022)   paliperidone (INVEGA) 3 MG 24 hr tablet Take 1 tablet (3 mg total) by mouth daily. (Patient not taking: Reported on 08/15/2022)   clindamycin (CLEOCIN) 300 MG capsule Take 1 capsule (300 mg total) by mouth 3  (three) times daily for 5 days.      Medical Decision Making  Dijon Cosens is a 52 y/o male with a history of substance induced psychosis, alcohol induced mood disorder, schizoaffectve disorder presenting to [facility] voluntarily after presenting to [facility] ED with an abscess to his left chin. Patient also endorsed suicidal ideation with a plan to hang himself with a whip.     Recommendations  Based on my evaluation the patient does not appear to have an emergency medical condition. Patient will be admitted to [facility] for crisis management, stability and safety.   Jasper Riling, NP 08/16/22  6:51 AM

## 2022-08-16 NOTE — ED Notes (Signed)
Pt sleeping at present, no distress noted.  Monitoring for safety. 

## 2022-08-16 NOTE — ED Provider Notes (Cosign Needed Addendum)
FBC/OBS ASAP Discharge Summary  Date and Time: 08/16/2022 1:05 PM  Name: [name]  MRN:  [id number]   Discharge Diagnoses:  Final diagnoses:  Schizoaffective disorder, bipolar type (HCC)  Suicidal ideation   Subjective: "I'm better, ready to go."   Stay Summary:  [name] is a 52 y/o male with a reported history of bipolar disorder initially presented to [facility] emergency room with complaints of abscess to chin area and suicidal ideation.  After medically cleared patient transferred to [facility] and admitted to continuous assessment unit for suicidal ideation, crisis management, safety, and stabilization.  According to EDP patient has had 60 ED visits over the last 6 months with combined medical and similar psychiatric complaints.  Patient was recently assessed and recommended outpatient psychiatric services.  Patient has yet to follow up with outpatient services.    [name] is seen face to face by this provider for psychiatric reassessment, chart reviewed and consulted with Dr. Dr. Nelly Rout 08/16/22.  On evaluation [name] reports he is feeling better and ready to leave.  He denies suicidal/self-harm/homicidal ideation, psychosis, and paranoia.  He states he just wants his medicines and to be discharged.  Informed he would need outpatient psychiatric services for medication management to get prescriptions filled after he is discharged today.  At first he states that he doesn't want outpatient services.  After informing that he can only get the refills if he has outpatient psychiatrist he agrees to an appointment for medication management.  He states that he is currently homeless sleeping under bridge.  He denies illicit drug use.  Patient reports that medications tolerated with no adverse reaction and slept well.  He states he is eating without difficulty.   During evaluation [name] is sitting up right in there.  There is no noted distress.  He is  alert/oriented x 4, calm, cooperative, attentive, and responses were relevant and appropriate to assessment questions.  He spoke in a clear tone at moderate volume, and normal pace, with good eye contact.   He denies suicidal/self-harm/homicidal ideation, psychosis, and paranoia.  Objectively:  there is no evidence of psychosis/mania or delusional thinking.  He conversed coherently, with goal directed thoughts, and no distractibility, or pre-occupation.  At this time [name] is educated and verbalizes understanding of mental health resources and other crisis services in the community. He is instructed to call 911 and present to the nearest emergency room should he experience any suicidal/homicidal ideation, auditory/visual/hallucinations, or detrimental worsening of his mental health condition.   He was a also advised by Clinical research associate that he could call the toll-free phone on back of Medicaid card to speak with care coordinator  Medical problems were identified and treated as needed. Medications ordered:  Meds ordered this encounter  Medications   acetaminophen (TYLENOL) tablet 650 mg   alum & mag hydroxide-simeth (MAALOX/MYLANTA) 200-200-20 MG/5ML suspension 30 mL   magnesium hydroxide (MILK OF MAGNESIA) suspension 30 mL   traZODone (DESYREL) tablet 50 mg   hydrOXYzine (ATARAX) tablet 25 mg   clindamycin (CLEOCIN) capsule 300 mg    Labs reviewed:    Routine labs: CBC/Diff, CMP, ETOH, UDS  Improvement was monitored by staff observation and clinical report along with [name] 's verbal report of emotional status and symptom reduction.  Upon completion of this admission [name] was both mentally and medically stable for discharge denying suicidal/homicidal ideation, auditory/visual/tactile hallucinations, delusional thoughts, and paranoia.  [name] was evaluated by the treatment team for stability and plans for continued recovery upon discharge. [name] 's motivation  was an integral factor for scheduling further treatment. Employment, transportation, bed availability, health status, family support, and any pending legal issues were also considered during stay. He was offered further treatment options upon discharge including but not limited to Residential, Intensive Outpatient, and Outpatient treatment.   [name] will follow up with  Follow-up Information     [facility]. Go on 09/24/2022.   Specialty: Urgent Care Why: You are scheduled for medication management with Dr. Hazle Quant on 09/24/2022 at 9:00 AM.  If you feel that you need to be seen sooner please go during walk in hours listed under discharge instructions. Contact information: [address] [place] [address] [phone]        [facility]. Go on 10/05/2022.   Specialty: Urgent Care Why: You are scheduled for therapy intake assessment with Adam on 10/05/2022 at 2:00 PM.  If you feel that you need to be seen sooner, please go during walk in hours listed under discharge instructions. Contact information: [address] [place] [address] [phone]               Total Time spent with patient: 30 minutes  Past Psychiatric History: substance use, substance induced mood disorder/psychosis, schizoaffective disorder bipolar type, bipolar disorder Past Medical History:  Past Medical History:  Diagnosis Date   Anxiety    Bipolar 1 disorder (HCC)    Schizoaffective disorder, bipolar type (HCC)    Stroke (HCC)     Family History:  Family History  Problem Relation Age of Onset   Heart disease Mother     Family Psychiatric History: None reported Social History:  Social History   Tobacco Use   Smoking status: Every Day    Packs/day: 1.00    Years: 5.00    Additional pack years: 0.00    Total pack years: 5.00    Types: Cigarettes   Smokeless tobacco: Never  Substance Use Topics   Alcohol use: Yes     Alcohol/week: 20.0 standard drinks of alcohol    Types: 20 Cans of beer per week    Comment: BAC was clear   Drug use: No    Types: Marijuana    Comment: UDS was clear    Tobacco Cessation:  A prescription for an FDA-approved tobacco cessation medication was offered at discharge and the patient refused  Current Medications:  Current Facility-Administered Medications  Medication Dose Route Frequency Provider Last Rate Last Admin   acetaminophen (TYLENOL) tablet 650 mg  650 mg Oral Q6H PRN Bobbitt, Shalon E, NP       alum & mag hydroxide-simeth (MAALOX/MYLANTA) 200-200-20 MG/5ML suspension 30 mL  30 mL Oral Q4H PRN Bobbitt, Shalon E, NP       clindamycin (CLEOCIN) capsule 300 mg  300 mg Oral TID Bobbitt, Shalon E, NP   300 mg at 08/16/22 0949   hydrOXYzine (ATARAX) tablet 25 mg  25 mg Oral TID PRN Bobbitt, Shalon E, NP       magnesium hydroxide (MILK OF MAGNESIA) suspension 30 mL  30 mL Oral Daily PRN Bobbitt, Shalon E, NP       traZODone (DESYREL) tablet 50 mg  50 mg Oral QHS PRN Bobbitt, Shalon E, NP       Current Outpatient Medications  Medication Sig Dispense Refill   clindamycin (CLEOCIN)  300 MG capsule Take 1 capsule (300 mg total) by mouth 3 (three) times daily for 5 days. 15 capsule 0   hydrOXYzine (ATARAX) 25 MG tablet Take 1 tablet (25 mg total) by mouth 3 (three) times daily as needed for anxiety. (Patient not taking: Reported on 06/16/2022) 30 tablet 0   melatonin 5 MG TABS Take 1 tablet (5 mg total) by mouth at bedtime as needed. (Patient not taking: Reported on 06/16/2022) 30 tablet 0   paliperidone (INVEGA) 3 MG 24 hr tablet Take 1 tablet (3 mg total) by mouth daily. (Patient not taking: Reported on 08/15/2022) 14 tablet 0    PTA Medications:  Facility Ordered Medications  Medication   acetaminophen (TYLENOL) tablet 650 mg   alum & mag hydroxide-simeth (MAALOX/MYLANTA) 200-200-20 MG/5ML suspension 30 mL   magnesium hydroxide (MILK OF MAGNESIA) suspension 30 mL   traZODone  (DESYREL) tablet 50 mg   hydrOXYzine (ATARAX) tablet 25 mg   clindamycin (CLEOCIN) capsule 300 mg   PTA Medications  Medication Sig   hydrOXYzine (ATARAX) 25 MG tablet Take 1 tablet (25 mg total) by mouth 3 (three) times daily as needed for anxiety. (Patient not taking: Reported on 06/16/2022)   melatonin 5 MG TABS Take 1 tablet (5 mg total) by mouth at bedtime as needed. (Patient not taking: Reported on 06/16/2022)   paliperidone (INVEGA) 3 MG 24 hr tablet Take 1 tablet (3 mg total) by mouth daily. (Patient not taking: Reported on 08/15/2022)   clindamycin (CLEOCIN) 300 MG capsule Take 1 capsule (300 mg total) by mouth 3 (three) times daily for 5 days.        No data to display          Flowsheet Row ED from 08/16/2022 in [facility] ED from 08/15/2022 in [facility] Emergency Department at [facility] ED from 06/18/2022 in [facility] Emergency Department at [facility]  C-SSRS RISK CATEGORY High Risk High Risk Error: Q3, 4, or 5 should not be populated when Q2 is No       Musculoskeletal  Strength & Muscle Tone: within normal limits Gait & Station: normal Patient leans: N/A  Psychiatric Specialty Exam  Presentation  General Appearance:  Appropriate for Environment  Eye Contact: Good  Speech: Clear and Coherent; Normal Rate  Speech Volume: Normal  Handedness: Right   Mood and Affect  Mood: Euthymic  Affect: Appropriate; Congruent   Thought Process  Thought Processes: Coherent; Goal Directed  Descriptions of Associations:Intact  Orientation:Full (Time, Place and Person)  Thought Content:Logical  Diagnosis of Schizophrenia or Schizoaffective disorder in past: Yes  Duration of Psychotic Symptoms: Greater than six months   Hallucinations:Hallucinations: None  Ideas of Reference:None  Suicidal Thoughts:Suicidal Thoughts: No  Homicidal Thoughts:Homicidal Thoughts: No   Sensorium  Memory: Immediate Good;  Recent Good  Judgment: Intact  Insight: Present   Executive Functions  Concentration: Good  Attention Span: Good  Recall: Good  Fund of Knowledge: Good  Language: Good   Psychomotor Activity  Psychomotor Activity: Psychomotor Activity: Normal   Assets  Assets: Communication Skills; Desire for Improvement; Physical Health   Sleep  Sleep: Sleep: Good Number of Hours of Sleep: -1   Nutritional Assessment (For OBS and FBC admissions only) Has the patient had a weight loss or gain of 10 pounds or more in the last 3 months?: No Has the patient had a decrease in food intake/or appetite?: No Does the patient have dental problems?: No Does the patient  have eating habits or behaviors that may be indicators of an eating disorder including binging or inducing vomiting?: No Has the patient recently lost weight without trying?: 0 Has the patient been eating poorly because of a decreased appetite?: 0 Malnutrition Screening Tool Score: 0    Physical Exam  Physical Exam Vitals and nursing note reviewed.  Constitutional:      General: He is not in acute distress.    Appearance: Normal appearance. He is not ill-appearing.  HENT:     Head: Normocephalic.  Eyes:     Conjunctiva/sclera: Conjunctivae normal.  Cardiovascular:     Rate and Rhythm: Normal rate.  Pulmonary:     Effort: Pulmonary effort is normal. No respiratory distress.  Musculoskeletal:        General: Normal range of motion.     Cervical back: Normal range of motion.  Skin:    General: Skin is warm and dry.  Neurological:     Mental Status: He is alert and oriented to person, place, and time.    Review of Systems  Constitutional:        No other complaints voiced   Psychiatric/Behavioral:  Depression: Stable. Hallucinations: Denies. Suicidal ideas: Denies. Nervous/anxious: Stable.   All other systems reviewed and are negative.  Blood pressure 95/63, pulse 70, temperature 97.8 F (36.6 C),  temperature source Oral, resp. rate 18, SpO2 99 %. There is no height or weight on file to calculate BMI.  Demographic Factors:  Low socioeconomic status, Living alone, and Unemployed  Loss Factors: Homeless, chronic  Historical Factors: NA  Risk Reduction Factors:   Religious beliefs about death and Positive coping skills or problem solving skills  Continued Clinical Symptoms:  Alcohol/Substance Abuse/Dependencies Previous Psychiatric Diagnoses and Treatments  Cognitive Features That Contribute To Risk:  None    Suicide Risk:  Minimal: No identifiable suicidal ideation.  Patients presenting with no risk factors but with morbid ruminations; may be classified as minimal risk based on the severity of the depressive symptoms  Plan Of Care/Follow-up recommendations:  Other:  Keep scheduled appointment and follow up with resources given  Disposition: No evidence of imminent risk to self or others at present.   Patient does not meet criteria for psychiatric inpatient admission. Supportive therapy provided about ongoing stressors. Discussed crisis plan, support from social network, calling 911, coming to the Emergency Department, and calling Suicide Hotline.   Resources given:    Discharge Instructions      If you need to be seen prior to your scheduled appointments for medication management or therapy please come during walk in hours listed below   [facility]: Outpatient psychiatric Services:   Please see the walk in hours listed below.  Medication Management New Patient needing Medication Management Walk-in, and Existing Patients needing to see a provider for management coming as a walk in   Monday thru Friday 8:00 AM first come first serve until slots are full.  Recommend being there by 7:15 AM to ensure a slot is open.  Therapy New Patient Therapy Intake and Existing Patients needing to see therapist coming in as a walk in.   Monday,  Wednesday, and Thursday morning at 8:00 am first come first serve.  Recommend being there by 7:15 AM to ensure a slot is open.    Every 1st, 2nd, and 3rd Friday at 1:00 PM first come first serve until slots are full.  Will still need to come in that morning at 7:15 AM to  get registered for an afternoon slot.  For all walk-ins we ask that you arrive by 7:15 am because patients will be seen in there order of arrival (FIRST COME FIRST SERVE) Availability is limited, therefore you may not be seen on the same day that you walk in if all slots are full.    Our goal is to serve and meet the needs of our community to the best of our ability.    [facility] Address: [address] [place], [place], [place] [address] Phone: [phone]  Supported Employment The supported employment program is a person-centered, individualized, evidence-based support service that helps members choose, acquire, and maintain competitive employment in our community. This service supports the varying needs of individuals and promotes community inclusion and employment success. Members enrolled in the supported employment program can expect the following:  Development of an individual career plan Community based job placement Job shadowing Job development On-site job Furniture conservator/restorer and support  Supported Education Supported education helps our members receive the education and training they need to achieve their learning and recovery goals. This will assist members with becoming gainfully employed in the job or career of their choice. The program includes assistance with: Registering for disability accommodations Enrolling in school and registering for classes Learning communication skills Scheduling tutoring sessions within your school [facility] partners with Vocational Rehabilitation to help increase the success of clients seeking employment and educational goals.  Want to learn more about our  programs?   Please contact our intake department INTAKE: [phone] Ext [phone]  Mailing: [address] [address]   [place], [place] [address]   [email or web address]   [facility] Center  Hours Monday - Friday: Services: 8:00AM - 3:00PM Offices: 8:00AM - 5:00PM  Physical Address [address] [place], [place] [address]   Please use this address for [facility] Mailing Address [address] [address] [place], [place] [address]  [facility] helps people reconnect This is a safe place to rest, take care of basic needs and access the services and community that make all the difference. Our guests come to the [facility] to take a class, do laundry, meet with a case manager or to get their mail. Sometimes they just need to sit in our dayroom and enjoy a conversation.  Here you will find everything from shower facilities to a computer lab, a mail room, classrooms and meeting spaces.  The IRC helps people reconnect with their own lives and with the community at large.  A caring community setting One of the most exciting aspects of the IRC is that so many individuals and organizations in the community are a part of the everyday experience. Whether it's a hair stylist or law firm offering services right in-house, our partners make the [facility] a truly interactive resource center where services are brought to our guests. The IRC brings together a comprehensive community of talented people who not only want to help solve problems, but also to be a part of our guests' lives.  Integrated Care We take a person-centered approach to assistance that includes: Case management Geneticist, molecular Medical clinic Mental health nurse Referrals  Fundamental Services We start with necessities: Midwife and Armed forces operational officer addresses and mailboxes Replacement IDs Onsite barbershop Storage lockers [place] winter warming center  Self-Sufficiency We connect our guests with: Skilled trade classes Job  skills classes Resume and jobs application assistance Interview training GED Academic librarian  Tomeko Scoville, NP 08/16/2022, 1:05 PM

## 2022-08-16 NOTE — Discharge Instructions (Addendum)
If you need to be seen prior to your scheduled appointments for medication management or therapy please come during walk in hours listed below   [facility]: Outpatient psychiatric Services:   Please see the walk in hours listed below.  Medication Management New Patient needing Medication Management Walk-in, and Existing Patients needing to see a provider for management coming as a walk in   Monday thru Friday 8:00 AM first come first serve until slots are full.  Recommend being there by 7:15 AM to ensure a slot is open.  Therapy New Patient Therapy Intake and Existing Patients needing to see therapist coming in as a walk in.   Monday, Wednesday, and Thursday morning at 8:00 am first come first serve.  Recommend being there by 7:15 AM to ensure a slot is open.    Every 1st, 2nd, and 3rd Friday at 1:00 PM first come first serve until slots are full.  Will still need to come in that morning at 7:15 AM to get registered for an afternoon slot.  For all walk-ins we ask that you arrive by 7:15 am because patients will be seen in there order of arrival (FIRST COME FIRST SERVE) Availability is limited, therefore you may not be seen on the same day that you walk in if all slots are full.    Our goal is to serve and meet the needs of our community to the best of our ability.    [facility] Address: [address] [place], [place], [place] [address] Phone: [phone]  Supported Employment The supported employment program is a person-centered, individualized, evidence-based support service that helps members choose, acquire, and maintain competitive employment in our community. This service supports the varying needs of individuals and promotes community inclusion and employment success. Members enrolled in the supported employment program can expect the following:  Development of an individual career plan Community based job placement Job shadowing Job development On-site job  Furniture conservator/restorer and support  Supported Education Supported education helps our members receive the education and training they need to achieve their learning and recovery goals. This will assist members with becoming gainfully employed in the job or career of their choice. The program includes assistance with: Registering for disability accommodations Enrolling in school and registering for classes Learning communication skills Scheduling tutoring sessions within your school [facility] partners with Vocational Rehabilitation to help increase the success of clients seeking employment and educational goals.  Want to learn more about our programs?   Please contact our intake department INTAKE: [phone] Ext [phone]  Mailing: [address] [address]   [place], [place] [address]   [email or web address]   Interactive Resource Center  Hours Monday - Friday: Services: 8:00AM - 3:00PM Offices: 8:00AM - 5:00PM  Physical Address [address] [place], [place] [address]   Please use this address for [facility] Mailing Address [address] [address] [place], [place] [address]  The [facility] helps people reconnect This is a safe place to rest, take care of basic needs and access the services and community that make all the difference. Our guests come to the [facility] to take a class, do laundry, meet with a case manager or to get their mail. Sometimes they just need to sit in our dayroom and enjoy a conversation.  Here you will find everything from shower facilities to a computer lab, a mail room, classrooms and meeting spaces.  The IRC helps people reconnect with their own lives and with the community at large.  A caring community setting One of the most exciting aspects of the IRC is that so many individuals and organizations in the community are a part of the everyday experience. Whether it's a hair stylist or law firm offering services right in-house, our partners make the [facility] a truly interactive resource  center where services are brought to our guests. The IRC brings together a comprehensive community of talented people who not only want to help solve problems, but also to be a part of our guests' lives.  Integrated Care We take a person-centered approach to assistance that includes: Case management Geneticist, molecular Medical clinic Mental health nurse Referrals  Fundamental Services We start with necessities: Midwife and Armed forces operational officer addresses and mailboxes Replacement IDs Onsite barbershop Storage lockers [place] winter warming center  Self-Sufficiency We connect our guests with: Skilled trade classes Job skills classes Resume and jobs application assistance Interview training GED Academic librarian

## 2022-08-16 NOTE — ED Notes (Signed)
Patient alert and oriented. Denies SI, HI, AVH, and pain. Scheduled medications administered to patient, per MD orders. Support and encouragement provided.  Routine safety checks conducted every hour.  Patient informed to notify staff with problems or concerns. No adverse drug reactions noted. Patient contracts for safety at this time. Patient compliant with medications and treatment plan. Patient receptive, calm, and cooperative. Patient interacts well with others on the unit.  Patient remains safe at this time. 

## 2022-08-16 NOTE — ED Notes (Addendum)
Patient Is discharging at this time. Patient A&Ox4. Printed AVS reviewed with patient along with follow up appointments and resources. Patient denies SI, HI, and A/V/H. Patient provided with bus pass. Valuables/belongings returned to patient. No s/s of current distress.

## 2022-08-16 NOTE — ED Notes (Signed)
Safe transport has arrived for pt, pt appears to be in NAD sat this time , RR even and unlabored, pt ambulatory to safe transport car

## 2022-08-18 ENCOUNTER — Other Ambulatory Visit (HOSPITAL_COMMUNITY): Payer: Self-pay

## 2022-08-18 ENCOUNTER — Other Ambulatory Visit: Payer: Self-pay

## 2022-08-18 ENCOUNTER — Encounter (HOSPITAL_COMMUNITY): Payer: Self-pay | Admitting: Emergency Medicine

## 2022-08-18 ENCOUNTER — Emergency Department (HOSPITAL_COMMUNITY): Payer: Medicaid Other

## 2022-08-18 ENCOUNTER — Emergency Department (HOSPITAL_COMMUNITY)
Admission: EM | Admit: 2022-08-18 | Discharge: 2022-08-18 | Disposition: A | Discharge status: Home / Self Care | Payer: Medicaid Other | Attending: Student | Admitting: Student

## 2022-08-18 DIAGNOSIS — R0781 Pleurodynia: Secondary | ICD-10-CM | POA: Diagnosis present

## 2022-08-18 DIAGNOSIS — F17210 Nicotine dependence, cigarettes, uncomplicated: Secondary | ICD-10-CM | POA: Diagnosis not present

## 2022-08-18 DIAGNOSIS — M79605 Pain in left leg: Secondary | ICD-10-CM | POA: Insufficient documentation

## 2022-08-18 DIAGNOSIS — Z8673 Personal history of transient ischemic attack (TIA), and cerebral infarction without residual deficits: Secondary | ICD-10-CM | POA: Insufficient documentation

## 2022-08-18 DIAGNOSIS — Z760 Encounter for issue of repeat prescription: Secondary | ICD-10-CM | POA: Diagnosis not present

## 2022-08-18 DIAGNOSIS — W57XXXD Bitten or stung by nonvenomous insect and other nonvenomous arthropods, subsequent encounter: Secondary | ICD-10-CM

## 2022-08-18 DIAGNOSIS — M272 Inflammatory conditions of jaws: Secondary | ICD-10-CM | POA: Diagnosis not present

## 2022-08-18 MED ORDER — PALIPERIDONE ER 3 MG PO TB24
3.0000 mg | ORAL_TABLET | Freq: Every day | ORAL | Status: DC
  Administered 2022-08-18: 3 mg via ORAL
  Filled 2022-08-18: qty 1

## 2022-08-18 MED ORDER — PALIPERIDONE ER 3 MG PO TB24
3.0000 mg | ORAL_TABLET | Freq: Every day | ORAL | 0 refills | Status: DC
  Filled 2022-08-18: qty 14, 14d supply, fill #0

## 2022-08-18 MED ORDER — CLINDAMYCIN HCL 150 MG PO CAPS
150.0000 mg | ORAL_CAPSULE | Freq: Once | ORAL | Status: AC
  Administered 2022-08-18: 150 mg via ORAL
  Filled 2022-08-18: qty 1

## 2022-08-18 MED ORDER — CLINDAMYCIN HCL 300 MG PO CAPS: 300.0000 mg | ORAL_CAPSULE | Freq: Three times a day (TID) | ORAL | 0 refills | Status: DC

## 2022-08-18 MED ORDER — CLINDAMYCIN HCL 300 MG PO CAPS: 300.0000 mg | ORAL_CAPSULE | Freq: Three times a day (TID) | ORAL | 0 refills | Status: AC

## 2022-08-18 MED ORDER — CLINDAMYCIN HCL 300 MG PO CAPS
ORAL_CAPSULE | ORAL | 0 refills | Status: DC
  Filled 2022-08-18: qty 15, 5d supply, fill #0

## 2022-08-18 MED ORDER — LIDOCAINE 5 % EX PTCH
1.0000 | MEDICATED_PATCH | CUTANEOUS | Status: DC
  Administered 2022-08-18: 1 via TRANSDERMAL
  Filled 2022-08-18: qty 1

## 2022-08-18 NOTE — ED Notes (Signed)
Home meds delivered by pharmacy

## 2022-08-18 NOTE — ED Provider Notes (Signed)
[place] EMERGENCY DEPARTMENT AT [facility] Provider Note  CSN: [id number] Arrival date & time: 08/18/22 1357  Chief Complaint(s) Insect Bite  HPI [name] is a 52 y.o. male with PMH CVA, homelessness, bipolar 1 disorder who presents emergency department for evaluation of an insect bite, lower extremity pain, rib pain and medication refill.  He states that he was seen at [facility] and discharged with a refill on his medications but was unable to get them filled.  States that he sleeps on the ground on his left side.  Denies fever, shortness of breath or any other systemic symptoms.   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 Authorizing Provider  clindamycin (CLEOCIN) 300 MG capsule Take 1 capsule (300 mg total) by mouth 3 (three) times daily for 5 days. 08/18/22 08/23/22  Lashika Erker, MD  hydrOXYzine (ATARAX) 25 MG tablet Take 1 tablet (25 mg total) by mouth 3 (three) times daily as needed for anxiety. Patient not taking: Reported on 06/16/2022 01/24/22   Bing Neighbors, NP  melatonin 5  MG TABS Take 1 tablet (5 mg total) by mouth at bedtime as needed. Patient not taking: Reported on 06/16/2022 04/09/22   Lenard Lance, FNP  paliperidone (INVEGA) 3 MG 24 hr tablet Take 1 tablet (3 mg total) by mouth daily. 08/18/22   Glendora Score, MD                                                                                                                                    Past Surgical History Past Surgical History:  Procedure Laterality Date   CHOLECYSTECTOMY N/A 12/24/2012   Procedure: LAPAROSCOPIC CHOLECYSTECTOMY;  Surgeon: Cherylynn Ridges, MD;  Location: [facility] OR;  Service: General;  Laterality: N/A;   FINGER SURGERY     Family History Family History  Problem Relation Age of Onset   Heart disease Mother     Social History Social History   Tobacco Use   Smoking status: Every Day    Packs/day: 1.00    Years: 5.00  Additional pack years: 0.00    Total pack years: 5.00    Types: Cigarettes   Smokeless tobacco: Never  Substance Use Topics   Alcohol use: Yes    Alcohol/week: 20.0 standard drinks of alcohol    Types: 20 Cans of beer per week    Comment: BAC was clear   Drug use: No    Types: Marijuana    Comment: UDS was clear   Allergies Patient has no known allergies.  Review of Systems Review of Systems  Musculoskeletal:  Positive for arthralgias and myalgias.  Skin:  Positive for rash.    Physical Exam Vital Signs  I have reviewed the triage vital signs BP 111/87   Pulse 69   Temp (!) 97.5 F (36.4 C) (Oral)   Resp 18   Ht 5\' 9"  (1.753 m)   Wt 74 kg   SpO2 100%   BMI 24.09 kg/m   Physical Exam Constitutional:      General: He is not in acute distress.    Appearance: Normal appearance.  HENT:     Head: Normocephalic and atraumatic.     Comments: Small abscess to the angle of mandible on the left    Nose: No congestion or rhinorrhea.  Eyes:     General:        Right eye: No discharge.        Left eye: No discharge.     Extraocular Movements:  Extraocular movements intact.     Pupils: Pupils are equal, round, and reactive to light.  Cardiovascular:     Rate and Rhythm: Normal rate and regular rhythm.     Heart sounds: No murmur heard. Pulmonary:     Effort: No respiratory distress.     Breath sounds: No wheezing or rales.  Abdominal:     General: There is no distension.     Tenderness: There is no abdominal tenderness.  Musculoskeletal:        General: Tenderness present. Normal range of motion.     Cervical back: Normal range of motion.  Skin:    General: Skin is warm and dry.  Neurological:     General: No focal deficit present.     Mental Status: He is alert.     ED Results and Treatments Labs (all labs ordered are listed, but only abnormal results are displayed) Labs Reviewed - No data to display                                                                                                                        Radiology No results found.  Pertinent labs & imaging results that were available during my care of the patient were reviewed by me and considered in my medical decision making (see MDM for details).  Medications Ordered in ED Medications  lidocaine (LIDODERM) 5 % 1 patch (1 patch Transdermal Patch Applied 08/18/22 1602)  paliperidone (INVEGA) 24 hr tablet 3 mg (3 mg Oral Given 08/18/22 1602)  clindamycin (CLEOCIN) capsule 150 mg (150 mg Oral Given 08/18/22 1602)                                                                                                                                     Procedures Procedures  (including critical care time)  Medical Decision Making / ED Course   This patient presents to the ED for concern of insect bite, leg pain, medication refill, this involves an extensive number of treatment options, and is a complaint that carries with it a high risk of complications and morbidity.  The differential diagnosis includes abscess, cellulitis, medication refill,  malingering  MDM: Patient seen emergency room for evaluation multiple complaints as described above.  Physical exam with a small abscess to the angle of the mandible on the left, no appreciable wounds or lesions seen on the left lower extremity.  Mild tenderness over the ribs on the left.  Rib series negative.  Patient given his bipolar medication, Lidoderm patch and clindamycin and medication will be brought to patient's bedside.  Patient does not meet inpatient criteria for admission and is safe for discharge with outpatient follow-up   Additional history obtained:  -External records from outside source obtained and reviewed including: Chart review including previous notes, labs, imaging, consultation notes   Imaging Studies ordered: I ordered imaging studies including rib series x-ray I independently visualized and interpreted imaging. I agree with the radiologist interpretation   Medicines ordered and prescription drug management: Meds ordered this encounter  Medications   lidocaine (LIDODERM) 5 % 1 patch   clindamycin (CLEOCIN) capsule 150 mg   paliperidone (INVEGA) 24 hr tablet 3 mg   clindamycin (CLEOCIN) 300 MG capsule    Sig: Take 1 capsule (300 mg total) by mouth 3 (three) times daily for 5 days.    Dispense:  15 capsule    Refill:  0   paliperidone (INVEGA) 3 MG 24 hr tablet    Sig: Take 1 tablet (3 mg total) by mouth daily.    Dispense:  14 tablet    Refill:  0    -I have reviewed the patients home medicines and have made adjustments as needed  Critical interventions none   Social Determinants of Health:  Factors impacting patients care include: homeless   Reevaluation: After the interventions noted above, I reevaluated the patient and found that they have :improved  Co morbidities that complicate the patient evaluation  Past Medical History:  Diagnosis Date   Anxiety    Bipolar 1 disorder (HCC)    Schizoaffective disorder, bipolar type (HCC)    Stroke  (HCC)       Dispostion: I considered admission for this patient, but at this time he does not meet inpatient criteria for admission and he is safe for discharge with outpatient follow-up     Final Clinical Impression(s) / ED Diagnoses Final diagnoses:  None     @PCDICTATION @  Glendora Score, MD 08/18/22 [phone]

## 2022-08-18 NOTE — ED Notes (Signed)
Pt given food and beverage 

## 2022-08-18 NOTE — ED Notes (Signed)
Patient verbalizes understanding of discharge instructions. Opportunity for questioning and answers were provided. Pt given bus pass; Pt discharged from ED.

## 2022-08-18 NOTE — ED Triage Notes (Signed)
Pt states he needs abx for spider bite under left side of chin. Denies SI. Also has left side soreness from walking.

## 2022-08-19 ENCOUNTER — Other Ambulatory Visit (HOSPITAL_COMMUNITY): Payer: Self-pay

## 2022-09-10 ENCOUNTER — Other Ambulatory Visit: Payer: Self-pay

## 2022-09-10 ENCOUNTER — Emergency Department (HOSPITAL_COMMUNITY)
Admission: EM | Admit: 2022-09-10 | Discharge: 2022-09-10 | Disposition: A | Discharge status: Home / Self Care | Payer: MEDICAID | Attending: Emergency Medicine | Admitting: Emergency Medicine

## 2022-09-10 DIAGNOSIS — M79605 Pain in left leg: Secondary | ICD-10-CM | POA: Insufficient documentation

## 2022-09-10 DIAGNOSIS — T63391D Toxic effect of venom of other spider, accidental (unintentional), subsequent encounter: Secondary | ICD-10-CM | POA: Insufficient documentation

## 2022-09-10 DIAGNOSIS — Z5900 Homelessness unspecified: Secondary | ICD-10-CM | POA: Insufficient documentation

## 2022-09-10 DIAGNOSIS — S1086XA Insect bite of other specified part of neck, initial encounter: Secondary | ICD-10-CM

## 2022-09-10 MED ORDER — ACETAMINOPHEN 325 MG PO TABS
650.0000 mg | ORAL_TABLET | Freq: Once | ORAL | Status: AC
  Administered 2022-09-10: 650 mg via ORAL
  Filled 2022-09-10: qty 2

## 2022-09-10 MED ORDER — DIPHENHYDRAMINE-ZINC ACETATE 2-0.1 % EX CREA
TOPICAL_CREAM | Freq: Once | CUTANEOUS | Status: AC
  Filled 2022-09-10 (×2): qty 28

## 2022-09-10 NOTE — ED Provider Triage Note (Signed)
Emergency Medicine Provider Triage Evaluation Note  [name] , a 52 y.o. male  was evaluated in triage.  Pt complains of spider bite.  Review of Systems  Positive:  Negative:   Physical Exam  BP 126/79 (BP Location: Right Arm)   Pulse 69   Temp 98.1 F (36.7 C) (Oral)   Resp 16   Ht 5\' 9"  (1.753 m)   Wt 73.9 kg   SpO2 100%   BMI 24.07 kg/m  Gen:   Awake, no distress   Resp:  Normal effort  MSK:   Moves extremities without difficulty  Other:    Medical Decision Making  Medically screening exam initiated at 3:44 PM.  Appropriate orders placed.  [name] was informed that the remainder of the evaluation will be completed by another provider, this initial triage assessment does not replace that evaluation, and the importance of remaining in the ED until their evaluation is complete.  Complaining of spider bite. Patient seen in ED 08/18/2022 for spider bite and was discharged with clindamycin. Patient was able to take it as prescribed. Patient states that he can't help but pick at the bite site.   Denies fever, chest pain, dyspnea,  abdominal pain, nausea, vomiting.    Dorthy Cooler, [place] 09/10/22 2212

## 2022-09-10 NOTE — ED Notes (Signed)
Pt d/c home per EDP order. Discharge summary reviewed, verbalizes understanding. Ambulatory out of triage
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
## 2022-09-10 NOTE — Discharge Instructions (Addendum)
It was a pleasure caring for you today. I have given you tylenol for your chronic knee pain and anti-itching cream for your spider bite. Please follow up with primary care or dermatology - both have information included in this discharge paperwork. Seek emergency care if experiencing any new or worsening symptoms.

## 2022-09-10 NOTE — ED Provider Notes (Signed)
EMERGENCY DEPARTMENT AT [facility] Provider Note   CSN: [id number] Arrival date & time: 09/10/22  1526     History  Chief Complaint  Patient presents with  . Insect Bite  . Leg Pain    left    [name] is a 52 y.o. male PMH CVA, homelessness, bipolar 1 disorder who presents emergency department for evaluation of an insect bite. Patient seen in ED 6/26 concerned that the spider bite was infected and was discharged with clindamycin. Patient has finished course of ABX. Concerned today because he keeps picking at spider bite wound and he is requesting anti-itching cream.   Denies fever, chest pain, dyspnea, abdominal pain, nausea, vomiting.   Leg Pain      Home Medications Prior to Admission medications   Medication Sig Start Date End Date Taking? Authorizing Provider  clindamycin (CLEOCIN) 300 MG capsule Take 1 capsule (300mg  total) by mouth 3 (three) times daily for 5 days. 08/18/22   Maxwell Marion, PA-C  hydrOXYzine (ATARAX) 25 MG tablet Take 1 tablet (25 mg total) by mouth 3 (three) times daily as needed for anxiety. Patient not taking: Reported on 06/16/2022 01/24/22   Bing Neighbors, NP  melatonin 5 MG TABS Take 1 tablet (5 mg total) by mouth at bedtime as needed. Patient not taking: Reported on 06/16/2022 04/09/22   Lenard Lance, FNP  paliperidone (INVEGA) 3 MG 24 hr tablet Take 1 tablet (3 mg total) by mouth daily. 08/18/22   Kommor, Wyn Forster, MD      Allergies    Patient has no known allergies.    Review of Systems   Review of Systems  Skin:  Positive for wound.    Physical Exam Updated Vital Signs BP 126/79 (BP Location: Right Arm)   Pulse 69   Temp 98.1 F (36.7 C) (Oral)   Resp 16   Ht 5\' 9"  (1.753 m)   Wt 73.9 kg   SpO2 100%   BMI 24.07 kg/m  Physical Exam Vitals and nursing note reviewed.  Constitutional:      General: He is not in acute distress.    Appearance: He is not ill-appearing or toxic-appearing.  HENT:      Head: Normocephalic and atraumatic.  Eyes:     General: No scleral icterus.       Right eye: No discharge.        Left eye: No discharge.     Conjunctiva/sclera: Conjunctivae normal.  Neck:   Cardiovascular:     Rate and Rhythm: Normal rate and regular rhythm.     Heart sounds: Normal heart sounds.  Pulmonary:     Effort: Pulmonary effort is normal.     Breath sounds: Normal breath sounds.  Abdominal:     General: Abdomen is flat.  Skin:    General: Skin is warm and dry.     Capillary Refill: Capillary refill takes less than 2 seconds.     Comments: Small spider bite wound under left jaw without active bleeding, swelling, surrounding erythema, or purulence. Area with small scarring. Bite appears to be healing appropriately.  Neurological:     General: No focal deficit present.     Mental Status: He is alert and oriented to person, place, and time. Mental status is at baseline.  Psychiatric:        Mood and Affect: Mood normal.        Behavior: Behavior normal.    ED Results / Procedures / Treatments  Labs (all labs ordered are listed, but only abnormal results are displayed) Labs Reviewed - No data to display  EKG None  Radiology No results found.  Procedures Procedures    Medications Ordered in ED Medications  diphenhydrAMINE-zinc acetate (BENADRYL) 2-0.1 % cream (has no administration in time range)    ED Course/ Medical Decision Making/ A&P                             Medical Decision Making Risk OTC drugs.   This patient presents to the ED for concern of spider bite, this involves an extensive number of treatment options, and is a complaint that carries with it a high risk of complications and morbidity.  The differential diagnosis includes irritant contact dermatitis, DRESS, atopic dermatitis, anaphylaxis, SJS/TEN   Co morbidities that complicate the patient evaluation  CVA, homelessness, bipolar 1 disorder   Problem List / ED Course / Critical  interventions / Medication management  Presents to ED concerned for spider bite. Bite was infected around 3 weeks ago and is healing well after ABX treatment. Physical exam showing small scarring around <57mm scab. Patient stating that he has been constantly picking at scab. Patient complaining because he doesn't like "bumps on his face" and requesting cream to help with itching. Providing patient with benadryl cream in ED. Patient afebrile with stable vitals. Patient not in acute distress.  Patient denying any infectious symptoms at this time - so I did not obtain labs or imaging. Patient agreed with plan. Patient provided with return precautions. Provided patient with information for dermatologist and primary care if area continues to itch/bother him.  Patient does not meet inpatient criteria for admission and is safe for discharge with outpatient follow-up . Of note, patient complaining of chronic medial left knee pain. Physical exam with normal active ROM of left knee. No swelling, warmth, or erythema of joint. No tenderness to palpation. Patient ambulatory without problems. Educated patient on patellofemoral syndrome and the benefit of RICE therapy. I have reviewed the patients home medicines and have made adjustments as needed  Ddx: these are considered less likely due to history of present illness and physical exam - irritant contact dermatitis: patient without irritant exposure, patient playful on exam -DRESS: patient afebrile, stable vitals and playful on exam -atopic dermatitis: rash diffuse, not confined to flexural areas -anaphylaxis: patient playful on exam; O2 100%, no nasal flaring or troubles breathing -SJS/TEN: negative Nikolsky sign -[place] Spotted Fever/Lyme Disease: no hx tick bite or fever -hemarthrosis: joint without swelling; ROM intact -gout: no warmth or erythema; ROM intact  -septic joint: afebrile; no warmth or erythema; no skin changes; ROM intact  -fracture: xray  without concern  -compartment syndrome: area not tense; neurovascularly intact   Social Determinants of Health:  Homeless             Final Clinical Impression(s) / ED Diagnoses Final diagnoses:  Insect bite of other part of neck, initial encounter    Rx / DC Orders ED Discharge Orders     None         Dorthy Cooler, [place] 09/10/22 1702    Margarita Grizzle, MD 09/15/22 [phone]

## 2022-09-24 ENCOUNTER — Ambulatory Visit (HOSPITAL_COMMUNITY): Payer: No Typology Code available for payment source | Admitting: Student

## 2022-09-25 ENCOUNTER — Emergency Department (HOSPITAL_COMMUNITY): Payer: MEDICAID

## 2022-09-25 ENCOUNTER — Emergency Department (HOSPITAL_COMMUNITY)
Admission: EM | Admit: 2022-09-25 | Discharge: 2022-09-25 | Disposition: A | Discharge status: Home / Self Care | Payer: MEDICAID | Attending: Emergency Medicine | Admitting: Emergency Medicine

## 2022-09-25 ENCOUNTER — Other Ambulatory Visit: Payer: Self-pay

## 2022-09-25 ENCOUNTER — Encounter (HOSPITAL_COMMUNITY): Payer: Self-pay

## 2022-09-25 DIAGNOSIS — S8012XA Contusion of left lower leg, initial encounter: Secondary | ICD-10-CM | POA: Diagnosis not present

## 2022-09-25 DIAGNOSIS — Y9301 Activity, walking, marching and hiking: Secondary | ICD-10-CM | POA: Diagnosis not present

## 2022-09-25 DIAGNOSIS — S7012XA Contusion of left thigh, initial encounter: Secondary | ICD-10-CM | POA: Diagnosis not present

## 2022-09-25 DIAGNOSIS — Y92410 Unspecified street and highway as the place of occurrence of the external cause: Secondary | ICD-10-CM | POA: Diagnosis not present

## 2022-09-25 DIAGNOSIS — W208XXA Other cause of strike by thrown, projected or falling object, initial encounter: Secondary | ICD-10-CM | POA: Insufficient documentation

## 2022-09-25 DIAGNOSIS — S8992XA Unspecified injury of left lower leg, initial encounter: Secondary | ICD-10-CM | POA: Diagnosis present

## 2022-09-25 MED ORDER — ACETAMINOPHEN 325 MG PO TABS
650.0000 mg | ORAL_TABLET | Freq: Once | ORAL | Status: DC
  Filled 2022-09-25: qty 2
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
## 2022-09-25 NOTE — Discharge Instructions (Addendum)
Please follow-up with your primary urinary symptoms and ER visit.  Today your x-rays did not show any abnormalities in terms of broken bones and that you may take Tylenol every 6 hours as needed for pain.  Please ice your leg is most likely have a bruise on your leg.  If symptoms change or worsen please return to ER.
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
## 2022-09-28 ENCOUNTER — Emergency Department (HOSPITAL_COMMUNITY)
Admission: EM | Admit: 2022-09-28 | Discharge: 2022-09-29 | Disposition: A | Discharge status: Home / Self Care | Payer: MEDICAID | Attending: Emergency Medicine | Admitting: Emergency Medicine

## 2022-09-28 ENCOUNTER — Encounter (HOSPITAL_COMMUNITY): Payer: Self-pay

## 2022-09-28 ENCOUNTER — Other Ambulatory Visit: Payer: Self-pay

## 2022-09-28 DIAGNOSIS — F17210 Nicotine dependence, cigarettes, uncomplicated: Secondary | ICD-10-CM | POA: Insufficient documentation

## 2022-09-28 DIAGNOSIS — R42 Dizziness and giddiness: Secondary | ICD-10-CM | POA: Diagnosis not present

## 2022-09-28 DIAGNOSIS — L559 Sunburn, unspecified: Secondary | ICD-10-CM | POA: Insufficient documentation

## 2022-09-28 LAB — CBC
HCT: 34.9 % — ABNORMAL LOW (ref 39.0–52.0)
Hemoglobin: 11.9 g/dL — ABNORMAL LOW (ref 13.0–17.0)
MCH: 31.7 pg (ref 26.0–34.0)
MCHC: 34.1 g/dL (ref 30.0–36.0)
MCV: 93.1 fL (ref 80.0–100.0)
Platelets: 194 10*3/uL (ref 150–400)
RBC: 3.75 MIL/uL — ABNORMAL LOW (ref 4.22–5.81)
RDW: 12.4 % (ref 11.5–15.5)
WBC: 7.3 10*3/uL (ref 4.0–10.5)
nRBC: 0 % (ref 0.0–0.2)

## 2022-09-28 LAB — BASIC METABOLIC PANEL
Anion gap: 9 (ref 5–15)
BUN: <5 mg/dL — ABNORMAL LOW (ref 6–20)
CO2: 24 mmol/L (ref 22–32)
Calcium: 8.4 mg/dL — ABNORMAL LOW (ref 8.9–10.3)
Chloride: 104 mmol/L (ref 98–111)
Creatinine, Ser: 0.83 mg/dL (ref 0.61–1.24)
GFR, Estimated: >60 mL/min (ref >60)
Glucose, Bld: 121 mg/dL — ABNORMAL HIGH (ref 70–99)
Potassium: 3.4 mmol/L — ABNORMAL LOW (ref 3.5–5.1)
Sodium: 137 mmol/L (ref 135–145)

## 2022-09-28 LAB — URINALYSIS, ROUTINE W REFLEX MICROSCOPIC
Bilirubin Urine: NEGATIVE
Glucose, UA: NEGATIVE mg/dL
Hgb urine dipstick: NEGATIVE
Ketones, ur: NEGATIVE mg/dL
Leukocytes,Ua: NEGATIVE
Nitrite: NEGATIVE
Protein, ur: NEGATIVE mg/dL
Specific Gravity, Urine: 1.006 (ref 1.005–1.030)
pH: 7 (ref 5.0–8.0)

## 2022-09-28 LAB — CBG MONITORING, ED: Glucose-Capillary: 124 mg/dL — ABNORMAL HIGH (ref 70–99)

## 2022-09-28 NOTE — ED Notes (Signed)
CBG IS 124

## 2022-09-28 NOTE — ED Triage Notes (Signed)
Pt reports she feels lightheaded and dizzy, states he has the body of Christ inside of him. He states his heart is not beating. He states he needs a blood transfusion. He states he has a sunburn to his legs after laying down in the park "basking in His glory." When asked why he feels like this he just stated "its what happens when it clams up."

## 2022-09-29 MED ORDER — ALOE VERA 72 % EX CREA: TOPICAL_CREAM | CUTANEOUS | 0 refills | Status: DC

## 2022-09-29 NOTE — ED Provider Notes (Signed)
[facility]-EMERGENCY DEPT [facility] Emergency Department Provider Note MRN:  [id number]  Arrival date & time: 09/29/22     Chief Complaint   Dizziness   History of Present Illness   [name] is a 52 y.o. year-old male with a history of bipolar disorder presenting to the ED with chief complaint of dizziness.  Dizziness for several months.  Laid in the sun today and has a bad sunburn.  Feels tired.  Review of Systems  A thorough review of systems was obtained and all systems are negative except as noted in the HPI and PMH.   Patient's Health History    Past Medical History:  Diagnosis Date   Anxiety    Bipolar 1 disorder (HCC)    Schizoaffective disorder, bipolar type (HCC)    Stroke [facility])     Past Surgical History:  Procedure Laterality Date   CHOLECYSTECTOMY N/A 12/24/2012   Procedure: LAPAROSCOPIC CHOLECYSTECTOMY;  Surgeon: Cherylynn Ridges, MD;  Location: [facility] OR;  Service: General;  Laterality: N/A;   FINGER SURGERY      Family History  Problem Relation Age of Onset   Heart disease Mother     Social History   Socioeconomic History   Marital status: Divorced    Spouse name: Not on file   Number of children: Not on file   Years of education: Not on file   Highest education level: Not on file  Occupational History   Not on file  Tobacco Use   Smoking status: Every Day    Current packs/day: 1.00    Average packs/day: 1 pack/day for 5.0 years (5.0 ttl pk-yrs)    Types: Cigarettes   Smokeless tobacco: Never  Substance and Sexual Activity   Alcohol use: Yes    Alcohol/week: 20.0 standard drinks of alcohol    Types: 20 Cans of beer per week    Comment: BAC was clear   Drug use: No    Types: Marijuana    Comment: UDS was clear   Sexual activity: Never    Birth control/protection: Condom  Other Topics Concern   Not on file  Social History Narrative   Not on file   Social Determinants of Health   Financial Resource Strain: Not on file  Food Insecurity:  Food Insecurity Present (08/16/2022)   Hunger Vital Sign    Worried About Running Out of Food in the Last Year: Sometimes true    Ran Out of Food in the Last Year: Patient declined  Transportation Needs: No Transportation Needs (08/16/2022)   PRAPARE - Administrator, Civil Service (Medical): No    Lack of Transportation (Non-Medical): No  Physical Activity: Not on file  Stress: Not on file  Social Connections: Not on file  Intimate Partner Violence: Not At Risk (08/16/2022)   Humiliation, Afraid, Rape, and Kick questionnaire    Fear of Current or Ex-Partner: No    Emotionally Abused: No    Physically Abused: No    Sexually Abused: No     Physical Exam   Vitals:   09/28/22 2117  BP: 102/77  Pulse: 81  Resp: 20  Temp: 98.3 F (36.8 C)  SpO2: 99%    CONSTITUTIONAL: Well-appearing, NAD NEURO/PSYCH:  Alert and oriented x 3, no focal deficits EYES:  eyes equal and reactive ENT/NECK:  no LAD, no JVD CARDIO: Regular rate, well-perfused, normal S1 and S2 PULM:  CTAB no wheezing or rhonchi GI/GU:  non-distended, non-tender MSK/SPINE:  No gross deformities, no  edema SKIN: Diffuse bright red sunburn to the legs, arms, torso.  Blister to the left lower leg   *Additional and/or pertinent findings included in MDM below  Diagnostic and Interventional Summary    EKG Interpretation Date/Time:  Tuesday September 28 2022 20:57:01 EDT Ventricular Rate:  83 PR Interval:  158 QRS Duration:  88 QT Interval:  358 QTC Calculation: 420 R Axis:   0  Text Interpretation: Normal sinus rhythm Normal ECG When compared with ECG of 15-Aug-2022 20:35, PREVIOUS ECG IS PRESENT Confirmed by Kennis Carina [phone]) on 09/29/2022 12:04:12 AM       Labs Reviewed  BASIC METABOLIC PANEL - Abnormal; Notable for the following components:      Result Value   Potassium 3.4 (*)    Glucose, Bld 121 (*)    BUN <5 (*)    Calcium 8.4 (*)    All other components within normal limits  CBC - Abnormal;  Notable for the following components:   RBC 3.75 (*)    Hemoglobin 11.9 (*)    HCT 34.9 (*)    All other components within normal limits  CBG MONITORING, ED - Abnormal; Notable for the following components:   Glucose-Capillary 124 (*)    All other components within normal limits  URINALYSIS, ROUTINE W REFLEX MICROSCOPIC    No orders to display    Medications - No data to display   Procedures  /  Critical Care Procedures  ED Course and Medical Decision Making  Initial Impression and Ddx Mostly first-degree burns from sun exposure.  Single blister/second-degree burn to the left lower leg.  Vitals normal.  Chronic dizziness.  Will obtain screening EKG, labs.  Patient is also hyperreligious likely from his chronic psychiatric illness but does not appear to be a threat of harm to self or others at this time.  Past medical/surgical history that increases complexity of ED encounter: Bipolar disorder  Interpretation of Diagnostics I personally reviewed the EKG and my interpretation is as follows: Sinus rhythm  Labs reassuring with no significant blood count or electrolyte disturbance  Patient Reassessment and Ultimate Disposition/Management     No emergent process appropriate for discharge.  Patient management required discussion with the following services or consulting groups:  None  Complexity of Problems Addressed Acute complicated illness or Injury  Additional Data Reviewed and Analyzed Further history obtained from: None  Additional Factors Impacting ED Encounter Risk Prescriptions  Elmer Sow. Pilar Plate, MD [facility] Emergency Medicine [facility] [email or web address]  Final Clinical Impressions(s) / ED Diagnoses     ICD-10-CM   1. Burn from the sun  L55.9       ED Discharge Orders          Ordered    Aloe Vera 72 % CREA        09/29/22 0003             Discharge Instructions Discussed with and Provided to Patient:    Discharge  Instructions      You were evaluated in the Emergency Department and after careful evaluation, we did not find any emergent condition requiring admission or further testing in the hospital.  Your exam/testing today was overall reassuring.  Use the aloe vera cream for your burns.  Stay out of the sun for the next several days as best you can.  Plenty of fluids and rest.  Please return to the Emergency Department if you experience any worsening of your condition.  Thank you for allowing  [place] to be a part of your care.       Sabas Sous, MD 09/29/22 [phone]

## 2022-09-29 NOTE — Discharge Instructions (Signed)
You were evaluated in the Emergency Department and after careful evaluation, we did not find any emergent condition requiring admission or further testing in the hospital.  Your exam/testing today was overall reassuring.  Use the aloe vera cream for your burns.  Stay out of the sun for the next several days as best you can.  Plenty of fluids and rest.  Please return to the Emergency Department if you experience any worsening of your condition.  Thank you for allowing [place] to be a part of your care.

## 2022-10-05 ENCOUNTER — Ambulatory Visit (HOSPITAL_COMMUNITY): Payer: No Typology Code available for payment source | Admitting: Licensed Clinical Social Worker

## 2022-11-05 ENCOUNTER — Ambulatory Visit (HOSPITAL_COMMUNITY)
Admission: EM | Admit: 2022-11-05 | Discharge: 2022-11-05 | Disposition: A | Discharge status: Home / Self Care | Payer: MEDICAID | Attending: Behavioral Health | Admitting: Behavioral Health

## 2022-11-05 DIAGNOSIS — Z5902 Unsheltered homelessness: Secondary | ICD-10-CM | POA: Insufficient documentation

## 2022-11-05 DIAGNOSIS — F250 Schizoaffective disorder, bipolar type: Secondary | ICD-10-CM | POA: Insufficient documentation

## 2022-11-05 DIAGNOSIS — Z7689 Persons encountering health services in other specified circumstances: Secondary | ICD-10-CM

## 2022-11-05 NOTE — Discharge Instructions (Addendum)
Discharge recommendations:   Outpatient Follow up: Please review list of outpatient resources for psychiatry and counseling. Please follow up with your primary care provider for all medical related needs.   You are encouraged to follow up with [facility] for outpatient treatment.  Walk in/ Open Access Hours: Monday - Friday 8AM - 11AM (To see provider and therapist) Friday - 1PM - 4PM (To see therapist only)  [facility] [address] [place], [place] [phone]  Safety:  The following safety precautions should be taken:   No sharp objects. This includes scissors, razors, scrapers, and putty knives.   Chemicals should be removed and locked up.   Medications should be removed and locked up.   Weapons should be removed and locked up. This includes firearms, knives and instruments that can be used to cause injury.   The patient should abstain from use of illicit substances/drugs and abuse of any medications.  If symptoms worsen or do not continue to improve or if the patient becomes actively suicidal or homicidal then it is recommended that the patient return to the closest hospital emergency department, the [facility], or call 911 for further evaluation and treatment. National Suicide Prevention Lifeline 1-800-SUICIDE or 845-861-5525.  About 988 988 offers 24/7 access to trained crisis counselors who can help people experiencing mental health-related distress. People can call or text 988 or chat 988lifeline.org for themselves or if they are worried about a loved one who may need crisis support.

## 2022-11-05 NOTE — Progress Notes (Signed)
   11/05/22 0622  [facility] Triage Screening (Walk-ins at [facility] only)  How Did You Hear About [place]? Legal System  What Is the Reason for Your Visit/Call Today? Pt presents to [facility] voluntarily, accompanied by law enforcement. Pt immediately states "I don't know why they brought me here". Pt repeatedly asked if he could be transferred somewhere else. Pt stated diagnosis of Bipolar Disorder, but was unclear if he is prescribed medications at this time. Pt is experiencing homelessness and is living [place] a bridge. Pt refused to answer if he had any alcohol or drug use within the last 24 hours. Pt stated " I'm not comfortable answering that right now".  Per [employer], pt stated that he was a danger to others and he was encouraged to be evaluated. Pt currently denies SI,HI,AVH.  How Long Has This Been Causing You Problems? <Week  Have You Recently Had Any Thoughts About Hurting Yourself? No  Are You Planning to Commit Suicide/Harm Yourself At This time? No  Have you Recently Had Thoughts About Hurting Someone [name]? No  Are You Planning To Harm Someone At This Time? No  Are you currently experiencing any auditory, visual or other hallucinations? No  Have You Used Any Alcohol or Drugs in the Past 24 Hours?  (pt refused to answer)  Do you have any current medical co-morbidities that require immediate attention? No  Clinician description of patient physical appearance/behavior: calm, cooperative, dishelveled  If access to [facility] was not available, would you have sought care in the Emergency Department? No  Determination of Need Routine (7 days)  Options For Referral Other: Comment;Outpatient Therapy;Medication Management

## 2022-11-05 NOTE — ED Provider Notes (Signed)
Behavioral Health Urgent Care Medical Screening Exam  Patient Name: [name] MRN: [id number] Date of Evaluation: 11/05/22  Diagnosis:  Final diagnoses:  Encounter for psychiatric assessment    History of Present illness: [name] is a 52 y.o. male patient with a psychiatric history significant for alcohol abuse, schizoaffective disorder, bipolar type, alcohol-induced mood disorder, substance-induced psychosis, cocaine abuse and history of malingering and homelessness.  Per triage note, Pt presents to [facility] voluntarily, accompanied by law enforcement. Pt immediately states "I don't know why they brought me here". Pt repeatedly asked if he could be transferred somewhere else. Pt stated diagnosis of Bipolar Disorder, but was unclear if he is prescribed medications at this time. Pt is experiencing homelessness and is [place] bridge. Pt refused to answer if he had any alcohol or drug use within the last 24 hours. Pt stated " I'm not comfortable answering that right now". Per [employer], pt stated that he was a danger to others and he was encouraged to be evaluated. Pt currently denies SI,HI,AVH.   On evaluation, patient is pacing in the hallway. He does not appear to be to be in acute distress. He does appear disheveled. He is alert and oriented x 3. He tells me that he is ready to go and does not know why he is here. He does agree to go into the assessment room to complete the psychiatric assessment. He denies suicidal ideations. He denies homicidal ideations.  He states, "no I am not hallucinating." There is no objective evidence that the patient is currently responding to internal or external stimuli on exam. He asked if he could have Adderall to help calm his nerves. I explained to the patient that Adderall is prescribed for patients who have ADHD on an outpatient basis. Patient states that he does not have ADHD but he takes it to calm down. When asked about his substance use history, patient  states, " I prefer not to be asked that."  Patient states that he is homeless and that he is trying to get to the next place. Patient offered information for the [facility] for shelter, patient declined. He states that he checked out of "self-help " that is located downtown. He is unable to further elaborate on what self-help is or the type of services offered. He again requested to be discharged.  Plan of care. Will discharge patient at this time as there is no evidence on exam of imminent risk to self or others and patient does not appear acutely psychotic. Patient does not meet [facility] IVC criteria.  Patient to be discharged with recommendations to follow up with outpatient psychiatry. Patient also provided with community resources for shelter.   Flowsheet Row ED from 11/05/2022 in [facility] ED from 09/28/2022 in [facility] Emergency Department at [facility] ED from 09/25/2022 in [facility] Emergency Department at [facility]  C-SSRS RISK CATEGORY No Risk No Risk No Risk       Psychiatric Specialty Exam  Presentation  General Appearance:Disheveled  Eye Contact:Fleeting  Speech:Clear and Coherent  Speech Volume:Decreased  Handedness:Right   Mood and Affect  Mood: Anxious  Affect: Congruent   Thought Process  Thought Processes: Coherent  Descriptions of Associations:Intact  Orientation:Full (Time, Place and Person)  Thought Content:Paranoid Ideation  Diagnosis of Schizophrenia or Schizoaffective disorder in past: Yes  Duration of Psychotic Symptoms: Greater than six months  Hallucinations:None hearing voices but could not say what the voices were  saying  Ideas of Reference:Paranoia  Suicidal Thoughts:No Without Plan  Homicidal Thoughts:No   Sensorium  Memory: Immediate Fair; Recent Fair  Judgment: Intact  Insight: Present   Executive Functions  Concentration: Fair  Attention  Span: Fair  Recall: [employer] of Knowledge: Fair  Language: Fair   Psychomotor Activity  Psychomotor Activity: Restlessness   Assets  Assets: Manufacturing systems engineer; Physical Health   Sleep  Sleep: Poor     Physical Exam: Physical Exam Cardiovascular:     Rate and Rhythm: Normal rate.  Pulmonary:     Effort: Pulmonary effort is normal.  Musculoskeletal:        General: Normal range of motion.     Cervical back: Normal range of motion.  Neurological:     Mental Status: He is alert and oriented to person, place, and time.    Review of Systems  Constitutional: Negative.   HENT: Negative.    Eyes: Negative.   Respiratory: Negative.    Cardiovascular: Negative.   Gastrointestinal: Negative.   Genitourinary: Negative.   Musculoskeletal: Negative.   Neurological: Negative.   Endo/Heme/Allergies: Negative.    Blood pressure 97/65, pulse 71, temperature 97.7 F (36.5 C), temperature source Oral, resp. rate 18, SpO2 100%. There is no height or weight on file to calculate BMI.  Musculoskeletal: Strength & Muscle Tone: within normal limits Gait & Station: normal Patient leans: N/A   [facility] MSE Discharge Disposition for Follow up and Recommendations:  Outpatient Follow up: Please review list of outpatient resources for psychiatry and counseling. Please follow up with your primary care provider for all medical related needs.   You are encouraged to follow up with [facility] for outpatient treatment.  Walk in/ Open Access Hours: Monday - Friday 8AM - 11AM (To see provider and therapist) Friday - 1PM - 4PM (To see therapist only)  [facility] [address] [place], [place] [phone]  Safety:  The following safety precautions should be taken:   No sharp objects. This includes scissors, razors, scrapers, and putty knives.   Chemicals should be removed and locked up.   Medications should be removed and  locked up.   Weapons should be removed and locked up. This includes firearms, knives and instruments that can be used to cause injury.   The patient should abstain from use of illicit substances/drugs and abuse of any medications.  If symptoms worsen or do not continue to improve or if the patient becomes actively suicidal or homicidal then it is recommended that the patient return to the closest hospital emergency department, the [facility], or call 911 for further evaluation and treatment. National Suicide Prevention Lifeline 1-800-SUICIDE or 443-176-0781.  About 988 988 offers 24/7 access to trained crisis counselors who can help people experiencing mental health-related distress. People can call or text 988 or chat 988lifeline.org for themselves or if they are worried about a loved one who may need crisis support.   Mckinsey Keagle L, NP 11/05/2022, 7:51 AM

## 2023-02-09 ENCOUNTER — Emergency Department (HOSPITAL_COMMUNITY)
Admission: EM | Admit: 2023-02-09 | Discharge: 2023-02-10 | Disposition: A | Discharge status: Other Acute Inpatient Hospital | Payer: Self-pay | Attending: Emergency Medicine | Admitting: Emergency Medicine

## 2023-02-09 ENCOUNTER — Encounter (HOSPITAL_COMMUNITY): Payer: Self-pay

## 2023-02-09 ENCOUNTER — Other Ambulatory Visit: Payer: Self-pay

## 2023-02-09 DIAGNOSIS — F17210 Nicotine dependence, cigarettes, uncomplicated: Secondary | ICD-10-CM | POA: Diagnosis not present

## 2023-02-09 DIAGNOSIS — F209 Schizophrenia, unspecified: Secondary | ICD-10-CM | POA: Diagnosis not present

## 2023-02-09 DIAGNOSIS — F250 Schizoaffective disorder, bipolar type: Secondary | ICD-10-CM | POA: Diagnosis present

## 2023-02-09 DIAGNOSIS — F29 Unspecified psychosis not due to a substance or known physiological condition: Secondary | ICD-10-CM | POA: Diagnosis not present

## 2023-02-09 DIAGNOSIS — F19150 Other psychoactive substance abuse with psychoactive substance-induced psychotic disorder with delusions: Secondary | ICD-10-CM | POA: Insufficient documentation

## 2023-02-09 DIAGNOSIS — R45850 Homicidal ideations: Secondary | ICD-10-CM | POA: Insufficient documentation

## 2023-02-09 DIAGNOSIS — F411 Generalized anxiety disorder: Secondary | ICD-10-CM | POA: Diagnosis present

## 2023-02-09 DIAGNOSIS — R442 Other hallucinations: Secondary | ICD-10-CM | POA: Insufficient documentation

## 2023-02-09 DIAGNOSIS — R443 Hallucinations, unspecified: Secondary | ICD-10-CM

## 2023-02-09 DIAGNOSIS — F319 Bipolar disorder, unspecified: Secondary | ICD-10-CM | POA: Diagnosis not present

## 2023-02-09 LAB — CBC WITH DIFFERENTIAL/PLATELET
Abs Immature Granulocytes: 0.03 10*3/uL (ref 0.00–0.07)
Basophils Absolute: 0 10*3/uL (ref 0.0–0.1)
Basophils Relative: 0 %
Eosinophils Absolute: 0 10*3/uL (ref 0.0–0.5)
Eosinophils Relative: 1 %
HCT: 40.9 % (ref 39.0–52.0)
Hemoglobin: 13.7 g/dL (ref 13.0–17.0)
Immature Granulocytes: 1 %
Lymphocytes Relative: 34 %
Lymphs Abs: 2.1 10*3/uL (ref 0.7–4.0)
MCH: 32.1 pg (ref 26.0–34.0)
MCHC: 33.5 g/dL (ref 30.0–36.0)
MCV: 95.8 fL (ref 80.0–100.0)
Monocytes Absolute: 0.5 10*3/uL (ref 0.1–1.0)
Monocytes Relative: 7 %
Neutro Abs: 3.6 10*3/uL (ref 1.7–7.7)
Neutrophils Relative %: 57 %
Platelets: 230 10*3/uL (ref 150–400)
RBC: 4.27 MIL/uL (ref 4.22–5.81)
RDW: 13.1 % (ref 11.5–15.5)
WBC: 6.3 10*3/uL (ref 4.0–10.5)
nRBC: 0 % (ref 0.0–0.2)

## 2023-02-09 LAB — COMPREHENSIVE METABOLIC PANEL
ALT: 11 U/L (ref 0–44)
AST: 15 U/L (ref 15–41)
Albumin: 4 g/dL (ref 3.5–5.0)
Alkaline Phosphatase: 74 U/L (ref 38–126)
Anion gap: 8 (ref 5–15)
BUN: 8 mg/dL (ref 6–20)
CO2: 24 mmol/L (ref 22–32)
Calcium: 8.9 mg/dL (ref 8.9–10.3)
Chloride: 106 mmol/L (ref 98–111)
Creatinine, Ser: 0.82 mg/dL (ref 0.61–1.24)
GFR, Estimated: >60 mL/min (ref >60)
Glucose, Bld: 102 mg/dL — ABNORMAL HIGH (ref 70–99)
Potassium: 3.5 mmol/L (ref 3.5–5.1)
Sodium: 138 mmol/L (ref 135–145)
Total Bilirubin: 0.5 mg/dL (ref <1.2)
Total Protein: 7 g/dL (ref 6.5–8.1)

## 2023-02-09 LAB — ETHANOL: Alcohol, Ethyl (B): <10 mg/dL (ref <10)

## 2023-02-09 LAB — ACETAMINOPHEN LEVEL: Acetaminophen (Tylenol), Serum: <10 ug/mL — ABNORMAL LOW (ref 10–30)

## 2023-02-09 LAB — SALICYLATE LEVEL: Salicylate Lvl: <7 mg/dL — ABNORMAL LOW (ref 7.0–30.0)

## 2023-02-09 NOTE — ED Triage Notes (Signed)
Pt arrived reporting homicidal ideations, denies SI. Patient also having visual and auditory hallucination.

## 2023-02-09 NOTE — ED Notes (Signed)
Pt has been changed into psych scrubs. Pt items have been placed behind triage nurses station.
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
## 2023-02-09 NOTE — ED Provider Triage Note (Signed)
Emergency Medicine Provider Triage Evaluation Note  [name] , a 52 y.o. male  was evaluated in triage.  Pt complains of homicidal ideation.  He states he has a lot going on and has not been able to get any of his medications, for the last few years, given that his Medicaid has been canceled.  He states that he wants to hurt someone, but will not name who.  Denies any suicidal ideation..  Review of Systems  Positive: HI Negative: SI  Physical Exam  BP 110/89 (BP Location: Right Arm)   Pulse 93   Temp 98.1 F (36.7 C) (Oral)   Resp 18   Ht 5\' 9"  (1.753 m)   Wt 67.6 kg   SpO2 98%   BMI 22.01 kg/m  Gen:   Awake, no distress   Resp:  Normal effort  MSK:   Moves extremities without difficulty Other:  Actively talking to self, paranoid, tangential speech.  Medical Decision Making  Medically screening exam initiated at 6:28 PM.  Appropriate orders placed.  [name] was informed that the remainder of the evaluation will be completed by another provider, this initial triage assessment does not replace that evaluation, and the importance of remaining in the ED until their evaluation is complete.  Concern for paranoia,/psychosis, discussed with triage RN, patient will need to go to psych for further evaluation   Pete Pelt, [place] 02/09/23 1830

## 2023-02-09 NOTE — ED Notes (Signed)
Pt has large blue duffle bag being held in security. He can have at discharge.
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
## 2023-02-10 ENCOUNTER — Inpatient Hospital Stay (HOSPITAL_COMMUNITY)
Admission: EM | Admit: 2023-02-10 | Discharge: 2023-02-18 | DRG: 885 | Disposition: A | Discharge status: Home / Self Care | Payer: No Typology Code available for payment source | Source: Intra-hospital | Attending: Psychiatry | Admitting: Psychiatry

## 2023-02-10 DIAGNOSIS — F19150 Other psychoactive substance abuse with psychoactive substance-induced psychotic disorder with delusions: Secondary | ICD-10-CM | POA: Diagnosis not present

## 2023-02-10 DIAGNOSIS — Z8673 Personal history of transient ischemic attack (TIA), and cerebral infarction without residual deficits: Secondary | ICD-10-CM

## 2023-02-10 DIAGNOSIS — M25562 Pain in left knee: Secondary | ICD-10-CM | POA: Diagnosis present

## 2023-02-10 DIAGNOSIS — Z8249 Family history of ischemic heart disease and other diseases of the circulatory system: Secondary | ICD-10-CM | POA: Diagnosis not present

## 2023-02-10 DIAGNOSIS — F250 Schizoaffective disorder, bipolar type: Secondary | ICD-10-CM

## 2023-02-10 DIAGNOSIS — M25561 Pain in right knee: Secondary | ICD-10-CM | POA: Diagnosis present

## 2023-02-10 DIAGNOSIS — Z5900 Homelessness unspecified: Secondary | ICD-10-CM

## 2023-02-10 DIAGNOSIS — F1090 Alcohol use, unspecified, uncomplicated: Secondary | ICD-10-CM | POA: Diagnosis present

## 2023-02-10 DIAGNOSIS — F17210 Nicotine dependence, cigarettes, uncomplicated: Secondary | ICD-10-CM | POA: Diagnosis present

## 2023-02-10 DIAGNOSIS — Z9151 Personal history of suicidal behavior: Secondary | ICD-10-CM

## 2023-02-10 DIAGNOSIS — F203 Undifferentiated schizophrenia: Secondary | ICD-10-CM | POA: Diagnosis not present

## 2023-02-10 DIAGNOSIS — R45850 Homicidal ideations: Secondary | ICD-10-CM | POA: Diagnosis present

## 2023-02-10 DIAGNOSIS — G8929 Other chronic pain: Secondary | ICD-10-CM | POA: Diagnosis present

## 2023-02-10 DIAGNOSIS — Z79899 Other long term (current) drug therapy: Secondary | ICD-10-CM

## 2023-02-10 DIAGNOSIS — Z5941 Food insecurity: Secondary | ICD-10-CM

## 2023-02-10 DIAGNOSIS — Z5982 Transportation insecurity: Secondary | ICD-10-CM

## 2023-02-10 DIAGNOSIS — F1290 Cannabis use, unspecified, uncomplicated: Secondary | ICD-10-CM | POA: Diagnosis present

## 2023-02-10 DIAGNOSIS — Z9049 Acquired absence of other specified parts of digestive tract: Secondary | ICD-10-CM

## 2023-02-10 DIAGNOSIS — F200 Paranoid schizophrenia: Secondary | ICD-10-CM | POA: Diagnosis present

## 2023-02-10 LAB — RAPID URINE DRUG SCREEN, HOSP PERFORMED
Amphetamines: NOT DETECTED
Barbiturates: NOT DETECTED
Benzodiazepines: NOT DETECTED
Cocaine: NOT DETECTED
Opiates: NOT DETECTED
Tetrahydrocannabinol: NOT DETECTED

## 2023-02-10 MED ORDER — PALIPERIDONE ER 3 MG PO TB24
3.0000 mg | ORAL_TABLET | Freq: Every day | ORAL | Status: DC
  Filled 2023-02-10: qty 1

## 2023-02-10 MED ORDER — ZIPRASIDONE MESYLATE 20 MG IM SOLR
20.0000 mg | Freq: Once | INTRAMUSCULAR | Status: AC
  Administered 2023-02-10: 20 mg via INTRAMUSCULAR
  Filled 2023-02-10: qty 20

## 2023-02-10 MED ORDER — LORAZEPAM 1 MG PO TABS: 1.0000 mg | ORAL_TABLET | ORAL | Status: DC | PRN

## 2023-02-10 MED ORDER — DIPHENHYDRAMINE HCL 50 MG/ML IJ SOLN
50.0000 mg | Freq: Once | INTRAMUSCULAR | Status: AC
  Administered 2023-02-10: 50 mg via INTRAMUSCULAR
  Filled 2023-02-10: qty 1

## 2023-02-10 MED ORDER — LORAZEPAM 2 MG/ML IJ SOLN
2.0000 mg | Freq: Once | INTRAMUSCULAR | Status: AC
  Administered 2023-02-10: 2 mg via INTRAMUSCULAR
  Filled 2023-02-10: qty 1

## 2023-02-10 MED ORDER — OLANZAPINE 5 MG PO TBDP: 5.0000 mg | ORAL_TABLET | Freq: Three times a day (TID) | ORAL | Status: DC | PRN

## 2023-02-10 MED ORDER — STERILE WATER FOR INJECTION IJ SOLN
INTRAMUSCULAR | Status: AC
  Administered 2023-02-10: 10 mL
  Filled 2023-02-10: qty 10

## 2023-02-10 MED ORDER — HALOPERIDOL LACTATE 5 MG/ML IJ SOLN
5.0000 mg | Freq: Once | INTRAMUSCULAR | Status: AC
  Administered 2023-02-10: 5 mg via INTRAMUSCULAR
  Filled 2023-02-10: qty 1

## 2023-02-10 MED ORDER — ZIPRASIDONE MESYLATE 20 MG IM SOLR: 20.0000 mg | INTRAMUSCULAR | Status: DC | PRN
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
## 2023-02-10 NOTE — ED Notes (Signed)
Pt vital signs was not done next shift take them when he wake up

## 2023-02-10 NOTE — ED Notes (Signed)
Patient resting in bed in hallway

## 2023-02-10 NOTE — ED Notes (Signed)
Patient resting in be breathing eyes closed
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
## 2023-02-10 NOTE — ED Notes (Signed)
Patient resting in bed breathing eyes closed in hallway bed

## 2023-02-10 NOTE — Progress Notes (Signed)
Pt was accepted to [facility] TODAY  02/10/2023; Bed Assignment 323 PENDING UDS,Signed Voluntary consent uploaded to pt's chart or faxed to [facility] FAX Number [phone]  Address: [address] [place], [place], [place] [address] [id number]  Pt meets inpatient criteria per: Dr.Maura Maryelizabeth Kaufmann, MD Telepsychiatry Consult Services  Attending Physician will be: Dr. Shellee Milo  Report can be called to: [phone]  Pt can arrive after: 0800  Care Team notified: Night [facility] Kenisha Herbin,RN,Marcus Erby Pian Deirdre Evener  [name], MSW, [facility] 02/10/2023 12:37 AM
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
## 2023-02-10 NOTE — ED Notes (Signed)
Patient resting in hallway bed breathing eyes closed

## 2023-02-10 NOTE — ED Notes (Signed)
Patient resting in hallway bed breathing with eyes closed.

## 2023-02-10 NOTE — ED Provider Notes (Signed)
Emergency Medicine Observation Re-evaluation Note  [name] is a 52 y.o. male, seen on rounds today.  Pt initially presented to the ED for complaints of Hallucinations and Homicidal Currently, the patient is calm and cooperative.  Physical Exam  BP 110/89 (BP Location: Right Arm)   Pulse 93   Temp 98.1 F (36.7 C) (Oral)   Resp 18   Ht 5\' 9"  (1.753 m)   Wt 67.6 kg   SpO2 98%   BMI 22.01 kg/m  Physical Exam General: Awake. Alert. No acute distress Cardiac: Regular rate rhythm Lungs: Clear to auscultation bilaterally Psych: Calm and cooperative  ED Course / MDM  EKG:   I have reviewed the labs performed to date as well as medications administered while in observation.  Recent changes in the last 24 hours include no acute issues.  Plan  Current plan is for transfer to [facility] where patient has been accepted.for inpatient psychiatric care    Royanne Foots, DO 02/10/23 1308

## 2023-02-10 NOTE — ED Notes (Signed)
Pt is yelling screaming " Someone is putting a finger in my a**". RN , tech and security to room, and informed pt that we can't have them him yelling , pt reports " I dont give a F*ck." RN informed pt again that there are other pts resting, and that yelling is not permitted at this hour. Pt reports he's not going to stop, pt having hallucinations at this time. RN will inform MD.

## 2023-02-11 ENCOUNTER — Encounter (HOSPITAL_COMMUNITY): Payer: Self-pay

## 2023-02-11 ENCOUNTER — Other Ambulatory Visit: Payer: Self-pay

## 2023-02-11 ENCOUNTER — Encounter (HOSPITAL_COMMUNITY): Payer: Self-pay | Admitting: Psychiatry

## 2023-02-11 DIAGNOSIS — F203 Undifferentiated schizophrenia: Principal | ICD-10-CM | POA: Diagnosis present

## 2023-02-11 MED ORDER — TRAZODONE HCL 50 MG PO TABS
50.0000 mg | ORAL_TABLET | Freq: Every evening | ORAL | Status: DC | PRN
  Administered 2023-02-11 – 2023-02-17 (×5): 50 mg via ORAL
  Filled 2023-02-11 (×6): qty 1

## 2023-02-11 MED ORDER — DIPHENHYDRAMINE HCL 50 MG/ML IJ SOLN
50.0000 mg | Freq: Three times a day (TID) | INTRAMUSCULAR | Status: DC | PRN
  Administered 2023-02-11: 50 mg via INTRAMUSCULAR

## 2023-02-11 MED ORDER — DIPHENHYDRAMINE HCL 25 MG PO CAPS: 50.0000 mg | ORAL_CAPSULE | Freq: Three times a day (TID) | ORAL | Status: DC | PRN

## 2023-02-11 MED ORDER — LORAZEPAM 2 MG/ML IJ SOLN: 2.0000 mg | Freq: Three times a day (TID) | INTRAMUSCULAR | Status: DC | PRN

## 2023-02-11 MED ORDER — LORAZEPAM 2 MG/ML IJ SOLN
2.0000 mg | Freq: Three times a day (TID) | INTRAMUSCULAR | Status: DC | PRN
  Administered 2023-02-11: 2 mg via INTRAMUSCULAR
  Filled 2023-02-11: qty 1

## 2023-02-11 MED ORDER — DIPHENHYDRAMINE HCL 50 MG/ML IJ SOLN
50.0000 mg | Freq: Three times a day (TID) | INTRAMUSCULAR | Status: DC | PRN
  Filled 2023-02-11: qty 1

## 2023-02-11 MED ORDER — ALUM & MAG HYDROXIDE-SIMETH 200-200-20 MG/5ML PO SUSP
30.0000 mL | ORAL | Status: DC | PRN
  Administered 2023-02-16: 30 mL via ORAL
  Filled 2023-02-11: qty 30

## 2023-02-11 MED ORDER — OLANZAPINE 10 MG IM SOLR: 10.0000 mg | Freq: Three times a day (TID) | INTRAMUSCULAR | Status: DC | PRN

## 2023-02-11 MED ORDER — ACETAMINOPHEN 325 MG PO TABS
650.0000 mg | ORAL_TABLET | Freq: Four times a day (QID) | ORAL | Status: DC | PRN
  Administered 2023-02-11 – 2023-02-15 (×5): 650 mg via ORAL
  Filled 2023-02-11 (×6): qty 2

## 2023-02-11 MED ORDER — PALIPERIDONE ER 6 MG PO TB24
6.0000 mg | ORAL_TABLET | Freq: Every day | ORAL | Status: DC
  Filled 2023-02-11 (×3): qty 1

## 2023-02-11 MED ORDER — MAGNESIUM HYDROXIDE 400 MG/5ML PO SUSP: 30.0000 mL | Freq: Every day | ORAL | Status: DC | PRN

## 2023-02-11 MED ORDER — DIPHENHYDRAMINE HCL 25 MG PO CAPS
50.0000 mg | ORAL_CAPSULE | Freq: Three times a day (TID) | ORAL | Status: DC | PRN
  Administered 2023-02-16 – 2023-02-18 (×2): 50 mg via ORAL
  Filled 2023-02-11 (×2): qty 2

## 2023-02-11 MED ORDER — HALOPERIDOL LACTATE 5 MG/ML IJ SOLN
10.0000 mg | Freq: Three times a day (TID) | INTRAMUSCULAR | Status: DC | PRN
  Administered 2023-02-11: 10 mg via INTRAMUSCULAR
  Filled 2023-02-11: qty 2

## 2023-02-11 MED ORDER — HALOPERIDOL LACTATE 5 MG/ML IJ SOLN: 5.0000 mg | Freq: Three times a day (TID) | INTRAMUSCULAR | Status: DC | PRN

## 2023-02-11 MED ORDER — OLANZAPINE 5 MG PO TBDP: 5.0000 mg | ORAL_TABLET | Freq: Three times a day (TID) | ORAL | Status: DC | PRN

## 2023-02-11 MED ORDER — HALOPERIDOL 5 MG PO TABS
5.0000 mg | ORAL_TABLET | Freq: Three times a day (TID) | ORAL | Status: DC | PRN
  Administered 2023-02-16 – 2023-02-18 (×2): 5 mg via ORAL
  Filled 2023-02-11 (×2): qty 1

## 2023-02-11 MED ORDER — DIPHENHYDRAMINE HCL 50 MG/ML IJ SOLN: 50.0000 mg | Freq: Three times a day (TID) | INTRAMUSCULAR | Status: DC | PRN

## 2023-02-11 MED ORDER — HALOPERIDOL 5 MG PO TABS: 5.0000 mg | ORAL_TABLET | Freq: Three times a day (TID) | ORAL | Status: DC | PRN

## 2023-02-11 MED ORDER — HYDROXYZINE HCL 25 MG PO TABS
25.0000 mg | ORAL_TABLET | Freq: Three times a day (TID) | ORAL | Status: DC | PRN
  Administered 2023-02-11 – 2023-02-15 (×4): 25 mg via ORAL
  Filled 2023-02-11 (×5): qty 1

## 2023-02-11 NOTE — Group Note (Signed)
Recreation Therapy Group Note   Group Topic:General Recreation  Group Date: 02/11/2023 Start Time: 1026 End Time: 1110 Facilitators: Casmer Yepiz-McCall, LRT,CTRS Location: 500 Hall Dayroom   Group Topic/Focus: General Recreation   Goal Area(s) Addresses:  Patient will use appropriate interactions in play with peers.   Patient will exhibit appropriate behavior during group session.  Intervention: Play and Exercise  Group Description: Keep it Going Volleyball. Patients were placed in a circle and were instructed to hit the volleyball to each other in any direction they chose. Patients were to keep the ball in rotation without it coming to a stop. The ball could be bounced off the floor or roll just as long as it didn't come to a stop. At the same time, LRT would be keeping time of how long the ball stayed in rotation. If the ball came to a stop, the time would start over.    Affect/Mood: N/A   Participation Level: Did not attend    Clinical Observations/Individualized Feedback:     Plan: Continue to engage patient in RT group sessions 2-3x/week.   Reathel Turi-McCall, LRT,CTRS 02/11/2023 12:59 PM

## 2023-02-11 NOTE — Progress Notes (Signed)
Patient refused his PO invega this am. He ambulated to the window when informed by the nurse to come for his medication. Patient asked this nurse to let him look at the medication, and  when given the medication,  he said "I don't want that medication, I knew you were going give me that, I just want leave." Q 15 minutes safety observation in place. Staff will to provide support to patient.

## 2023-02-11 NOTE — Plan of Care (Signed)
  Problem: Education: Goal: Knowledge of [facility] Education information/materials will improve Outcome: Progressing Goal: Emotional status will improve Outcome: Progressing Goal: Mental status will improve Outcome: Progressing Goal: Verbalization of understanding the information provided will improve Outcome: Progressing   

## 2023-02-11 NOTE — Progress Notes (Signed)
   02/11/23 0800  Psych Admission Type (Psych Patients Only)  Admission Status Involuntary  Psychosocial Assessment  Patient Complaints Anxiety;Disorientation;Irritability  Eye Contact Fair  Facial Expression Flat  Affect Anxious  Speech Logical/coherent  Interaction Arrogant  Motor Activity Slow  Appearance/Hygiene Disheveled  Behavior Characteristics Unwilling to participate;Anxious;Agitated  Mood Labile  Thought Process  Coherency Circumstantial  Content Blaming others  Delusions None reported or observed  Perception Hallucinations;Derealization  Hallucination Auditory  Judgment Impaired  Confusion None  Danger to Self  Current suicidal ideation? Denies  Danger to Others  Danger to Others None reported or observed

## 2023-02-11 NOTE — Progress Notes (Signed)
Patient agitated screaming with loud outburst wants to be discharged. Unable to be redirectable refused PO medications.

## 2023-02-11 NOTE — BHH Group Notes (Addendum)

## 2023-02-11 NOTE — BHH Suicide Risk Assessment (Signed)
Suicide Risk Assessment  Admission Assessment    [facility] Admission Suicide Risk Assessment   Nursing information obtained from:  Patient  Demographic factors:  Male, Divorced or widowed, Caucasian, Low socioeconomic status, Living alone  Current Mental Status:  Thoughts of violence towards others  Loss Factors:  Loss of significant relationship  Historical Factors:  Impulsivity  Risk Reduction Factors:  Positive coping skills or problem solving skills  Total Time spent with patient: 1.5 hours  Principal Problem: Undifferentiated schizophrenia (HCC)  Diagnosis:  Principal Problem:   Undifferentiated schizophrenia (HCC)  Subjective Data: See H&P.  Continued Clinical Symptoms:  Alcohol Use Disorder Identification Test Final Score (AUDIT): 0 The "Alcohol Use Disorders Identification Test", Guidelines for Use in Primary Care, Second Edition.  World Science writer [facility]). Score between 0-7:  no or low risk or alcohol related problems. Score between 8-15:  moderate risk of alcohol related problems. Score between 16-19:  high risk of alcohol related problems. Score 20 or above:  warrants further diagnostic evaluation for alcohol dependence and treatment.  CLINICAL FACTORS:   Severe Anxiety and/or Agitation Schizophrenia:   Paranoid or undifferentiated type Currently Psychotic Unstable or Poor Therapeutic Relationship Previous Psychiatric Diagnoses and Treatments  Musculoskeletal: Patient currently has a walker. Says his legs hurt from walking [place].  Strength & Muscle Tone: within normal limits Gait & Station: unsteady Patient leans: N/A  Psychiatric Specialty Exam:  Presentation  General Appearance:  Casual; Disheveled  Eye Contact: Fleeting  Speech: Pressured (loud, disorganized, tangential.)  Speech Volume: Increased (Loud)  Handedness: Right   Mood and Affect  Mood: Dysphoric; Irritable  Affect: Congruent  Thought Process   Thought Processes: Disorganized  Descriptions of Associations:Tangential  Orientation:Partial  Thought Content:Illogical; Paranoid Ideation; Scattered; Tangential  History of Schizophrenia/Schizoaffective disorder:Yes  Duration of Psychotic Symptoms:Greater than six months  Hallucinations:Hallucinations: -- (Hyper-religious.)  Ideas of Reference:Delusions; Paranoia  Suicidal Thoughts:Suicidal Thoughts: No  Homicidal Thoughts:Homicidal Thoughts: No   Sensorium  Memory: Recent Fair; Remote Fair; Immediate Fair  Judgment: Impaired  Insight: Poor  Executive Functions  Concentration: Poor  Attention Span: Poor  Recall: Fair  Fund of Knowledge: Poor  Language: Fair  Psychomotor Activity  Psychomotor Activity:Psychomotor Activity: Increased; Mannerisms; Restlessness  Assets  Assets: Manufacturing systems engineer; Resilience  Sleep  Sleep:Sleep: Poor Number of Hours of Sleep: 4  Physical Exam: See H&P.  Blood pressure 96/71, pulse (!) 102, temperature 97.6 F (36.4 C), temperature source Oral, resp. rate 18, height 5\' 9"  (1.753 m), weight 68 kg, SpO2 98%. Body mass index is 22.14 kg/m.  COGNITIVE FEATURES THAT CONTRIBUTE TO RISK:  Loss of executive function, Polarized thinking, and Thought constriction (tunnel vision)    SUICIDE RISK:   Severe:  Frequent, intense, and enduring suicidal ideation, specific plan, no subjective intent, but some objective markers of intent (i.e., choice of lethal method), the method is accessible, some limited preparatory behavior, evidence of impaired self-control, severe dysphoria/symptomatology, multiple risk factors present, and few if any protective factors, particularly a lack of social support.  PLAN OF CARE: See H&P.  I certify that inpatient services furnished can reasonably be expected to improve the patient's condition.   Armandina Stammer, NP, pmhnp, fnp-bc. 02/11/2023, 12:15 PM

## 2023-02-11 NOTE — Care Management Utilization Note (Signed)
This patient's chart says he has no insurance, but there is a scan under the media tab from this current year for medicaid for this patient.

## 2023-02-11 NOTE — [facility] Group Notes (Signed)
[facility] Group Notes:  (Nursing/MHT/Case Management/Adjunct) Adult Psychoeducational Group Note  Date:  02/11/2023 Time:  6:18 PM  Group Topic/Focus:  Goals Group:   The focus of this group is to help patients establish daily goals to achieve during treatment and discuss how the patient can incorporate goal setting into their daily lives to aide in recovery. Orientation:   The focus of this group is to educate the patient on the purpose and policies of crisis stabilization and provide a format to answer questions about their admission.  The group details unit policies and expectations of patients while admitted.  Participation Level:  Did Not Attend  Additional Comments:  Did not attend  Roby Donaway Alen Blew 02/11/2023, 6:18 PM
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
## 2023-02-11 NOTE — Progress Notes (Signed)
PT Cancellation Note  Patient Details Name: ORAS SPLITT MRN: [id number] DOB: [birth year]   Cancelled Treatment:     PT signing off. OT at [facility] was able to address all needs and questions for this patient. Thank you    Masoud Nyce 02/11/2023, 4:00 PM

## 2023-02-11 NOTE — Progress Notes (Addendum)
Initial Treatment Plan [name] 02/11/2023 12:11 AM MRN: [id number]    PATIENT STRESSORS: Medication noncompliance   Homelessness Auditory hallucinations  Homicidal ideations     PATIENT STRENGTHS: Acknowledgement of the need for help  Awareness of the need to restart his medications      PATIENT IDENTIFIED PROBLEMS: "I haven't been on my meds, and I just need help"   "the voices are bothering me"                            DISCHARGE CRITERIA:  Ability to meet basic life and health needs Adequate post-discharge living arrangements Improved stabilization in mood, thinking, and/or behavior Need for constant or close observation no longer present Reduction of life-threatening or endangering symptoms to within safe limits Safe-care adequate arrangements made Verbal commitment to aftercare and medication compliance PRELIMINARY DISCHARGE PLAN: Discharge to a safe and adequate living arrangement   PATIENT/FAMILY INVOLVEMENT: This treatment plan has been presented to and reviewed with the patient, [name]. The patient have been given the opportunity to ask questions and make suggestions.   [place], [place]  02/11/2023  12:16 AM

## 2023-02-11 NOTE — Plan of Care (Signed)
  Problem: Activity: Goal: Interest or engagement in activities will improve Outcome: Progressing   Problem: Coping: Goal: Ability to verbalize frustrations and anger appropriately will improve Outcome: Progressing   Problem: Safety: Goal: Ability to demonstrate self-control will improve Outcome: Progressing
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
## 2023-02-11 NOTE — Progress Notes (Signed)
OT Screen only   OT met w/ and screened pt today. At baseline pt does not use a RW. Pt has asked for a RW to help him off load some of the weight he experiences when carrying his backpack. Pt claims his pack is nearly one hundred pounds. In this [facility] setting pt is ambulatory w/o RW and does not present w/ LOB during ambulation or sit to stand. Pt is able to perform sit to stand 10 times form EOB w/o complications. The RW the pt had in his room required adjustment as it was set too tall for his height. However, at this time, pt does not present w/ balance deficits that would justify RW to aide in mobility. These findings were discussed w/ nursing and the PT supervisor. OT will sign off at this time. Please feel free to re-consult as needed.   Kerrin Champagne, OT

## 2023-02-12 DIAGNOSIS — F203 Undifferentiated schizophrenia: Secondary | ICD-10-CM | POA: Diagnosis not present

## 2023-02-12 DIAGNOSIS — F200 Paranoid schizophrenia: Principal | ICD-10-CM | POA: Insufficient documentation

## 2023-02-12 MED ORDER — RISPERIDONE 2 MG PO TBDP
2.0000 mg | ORAL_TABLET | Freq: Every day | ORAL | Status: DC
  Administered 2023-02-13 – 2023-02-16 (×4): 2 mg via ORAL
  Filled 2023-02-12 (×7): qty 1

## 2023-02-12 NOTE — Plan of Care (Signed)
  Problem: Activity: Goal: Sleeping patterns will improve Outcome: Progressing   Problem: Coping: Goal: Ability to demonstrate self-control will improve Outcome: Progressing   Problem: Safety: Goal: Periods of time without injury will increase Outcome: Progressing   

## 2023-02-12 NOTE — Plan of Care (Signed)
  Problem: Education: Goal: Knowledge of [facility] Education information/materials will improve Outcome: Not Progressing Goal: Emotional status will improve Outcome: Not Progressing Goal: Mental status will improve Outcome: Not Progressing   

## 2023-02-12 NOTE — BHH Counselor (Signed)
CSW attempt to do assessment, unable to finish due to patient not complying with assessment questions and has erratic response to CSW. Patient would stated "why y'all ask the same questions over and over" Patient would not response to CSW. CSW will do summary per chart review and client denies consents to speak to his mom and consent for ROI.

## 2023-02-12 NOTE — Progress Notes (Signed)
   02/12/23 1100  Psych Admission Type (Psych Patients Only)  Admission Status Involuntary  Psychosocial Assessment  Patient Complaints Depression  Eye Contact Fair  Facial Expression Flat  Affect Anxious  Speech Logical/coherent  Interaction Hostile  Motor Activity Slow  Appearance/Hygiene Disheveled  Behavior Characteristics Unwilling to participate  Mood Labile  Thought Process  Coherency Circumstantial  Content Blaming others  Delusions None reported or observed  Perception Hallucinations  Hallucination Auditory  Judgment Impaired  Confusion None  Danger to Self  Current suicidal ideation? Denies  Danger to Others  Danger to Others None reported or observed   Dar Note: Patient presents with irritable affect and mood.  Refused Risperdal this morning.  Stated, "I don't want any medication."  MD made aware.  Patient ambulatory on the unit with a walker.  Routine safety checks maintained.

## 2023-02-12 NOTE — Group Note (Signed)
Date:  02/12/2023 Time:  8:20 PM  Group Topic/Focus:  Wrap-Up Group:   The focus of this group is to help patients review their daily goal of treatment and discuss progress on daily workbooks.    Participation Level:  Did Not Attend  Scot Dock 02/12/2023, 8:20 PM

## 2023-02-12 NOTE — B[facility] Counselor (Signed)
Adult Comprehensive Assessment  Patient ID: KWANE AMELIO, male   DOB: 28-May-1970, 52 y.o.   MRN: [id number]  Information Source: Information source: Patient  Current Stressors:  Patient states their primary concerns and needs for treatment are:: Patient stated that he needs a little of help Patient states their goals for this hospitilization and ongoing recovery are:: Patient stated that his goals are not met because no one is helping him Educational / Learning stressors: none reported Employment / Job issues: none reported Family Relationships: none reported Surveyor, quantity / Lack of resources (include bankruptcy): none reported Housing / Lack of housing: none reported Physical health (include injuries & life threatening diseases): none reported Social relationships: none reported Substance abuse: Patient is erratic Bereavement / Loss: none reported  Living/Environment/Situation:  Living Arrangements: Alone  Family History:     Childhood History:  Additional childhood history information: Patient reports both parents abused alcohol Description of patient's relationship with caregiver when they were a child: Fair with mother - difficult relationship with father Witnessed domestic violence?: No Has patient been affected by domestic violence as an adult?: No  Education:  Highest grade of school patient has completed: none reported  Employment/Work Situation:   Employment Situation: On disability Why is Patient on Disability: Due to mental health How Long has Patient Been on Disability: Unknown Patient's Job has Been Impacted by Current Illness: No What is the Longest Time Patient has Held a Job?: Six years 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?: No  Financial Resources:   Surveyor, quantity resources: No income, Writer, [place]  Alcohol/Substance Abuse:      Social Support System:   Type of faith/religion:  Christian How does patient's faith help to cope with current illness?: Patient stated that because he is our god  Leisure/Recreation:   Do You Have Hobbies?: No  Strengths/Needs:      Discharge Plan:   Patient states they will know when they are safe and ready for discharge when: Patient stated that he is ready now Does patient have access to transportation?: Yes (Patient stated that he will find a way) Does patient have financial barriers related to discharge medications?: No Will patient be returning to same living situation after discharge?: No (Patient stated that "they are doing nothing to help him here, discharge me!")  Summary/Recommendations:   Summary and Recommendations (to be completed by the evaluator): Brentton Chrisco is a 52 y.o. male, Patient was not compliance with answer questions stated that some of the questions are repetitive. Patient was erratic with his answer and got agitate CSW was not able to finish assessment. Patient decline to signed consent for SPE and ROI. Summary is done per chart review. 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 Patient was agitate and has erratic response to assessment. Per chart client denies SI or HI. Patient stated that his goals are not being met here and he needs to transfer to [facility]. Per chart by MD, "His thoughts are not reality based and he is mostly illogical and irrational. He is tangential. Anxiety level is elevated. Concentration is poor." Patient will benefit from crisis stabilization, medication evaluation, group therapy and psychoeducation, in addition to case management for discharge planning. At discharge it is recommended that Patient adhere to the established discharge plan and continue in treatment.  Marc Leichter O Layza Summa.  02/12/2023 

## 2023-02-12 NOTE — Progress Notes (Signed)
[facility] MD Progress Note  02/12/2023 11:45 AM [name]  MRN:  [id number]  Subjective:    This is one of several psychiatric admissions in this [facility] for this 52 year old Caucasian male with probable hx of chronic mental illnesses & substance use disorder. Admitted to the [facility] from the [facility] ED. Apparently, patient presented to the [facility] ED with complaint of homicidal ideations to no one in particular. However, chart review indicated that he may not have been taken his mental health medications for a while. Patient reported while at the ED that the voices were bothering him & was actively responding to internal stimuli. After medical evaluation/clearance, patient was transferred to the [facility] for further psychiatric evaluation/treatments.   Yesterday the psychiatry team made the following recommendations: -Start Invega 6 mg oral daily for schizophrenia goal to transition to LAI  Overnight, nursing reports the patient had received agitation protocol.  Nursing reports patient has been refusing Invega.  Risperdal was also started this morning and said Invega, and the patient refused this as well.  I have asked the second psychiatrist to evaluate the patient for forced medication over objection.  On my evaluation today, the patient is still agitated, paranoid, delusional.  He states that he is being attacked.  He reports that everyone is meddling in his business.  He is very bizarre and rambling in his responses.  His thoughts are not reality based and he is mostly illogical and irrational.  He is tangential.  Denies any AH or VH.  Denies any SI or HI today.  Mood is irritable.  Anxiety level is elevated.  Sleep is 9 hours per nursing, but note this is after the patient had received agitation protocol.  Patient reports appetite is okay.  Concentration is poor.  Patient has been refusing scheduled psychiatric medication, therefore cannot comment on any side effects.  Denies any  side effects after receiving agitation protocol last night.   Principal Problem: Paranoid schizophrenia (HCC) Diagnosis: Principal Problem:   Paranoid schizophrenia (HCC)  Total Time spent with patient: 20 minutes  Past Psychiatric History:  Per previous notes:  Reports history of prior psychiatric admissions, most recently in February 2019. At the time was admitted for psychotic symptoms, was diagnosed Schizoaffective Disorder, treated with Abilify long acting IM management, which he states was more recently changed to [place] Q 3 months  . Reports history of a prior suicide attempt " many years ago" by overdosing . He reports he has been diagnosed with Bipolar Disorder in the past .  Past Medical History:  Past Medical History:  Diagnosis Date   Anxiety    Bipolar 1 disorder (HCC)    Schizoaffective disorder, bipolar type (HCC)    Stroke [facility])     Past Surgical History:  Procedure Laterality Date   CHOLECYSTECTOMY N/A 12/24/2012   Procedure: LAPAROSCOPIC CHOLECYSTECTOMY;  Surgeon: Cherylynn Ridges, MD;  Location: [facility] OR;  Service: General;  Laterality: N/A;   FINGER SURGERY     Family History:  Family History  Problem Relation Age of Onset   Heart disease Mother    Family Psychiatric  History: See H&P  Social History:  Social History   Substance and Sexual Activity  Alcohol Use Yes   Alcohol/week: 20.0 standard drinks of alcohol   Types: 20 Cans of beer per week   Comment: BAC was clear     Social History   Substance and Sexual Activity  Drug Use No  Types: Marijuana   Comment: UDS was clear    Social History   Socioeconomic History   Marital status: Divorced    Spouse name: Not on file   Number of children: Not on file   Years of education: Not on file   Highest education level: Not on file  Occupational History   Not on file  Tobacco Use   Smoking status: Every Day    Current packs/day: 1.00    Average packs/day: 1 pack/day for 5.0 years (5.0 ttl pk-yrs)     Types: Cigarettes   Smokeless tobacco: Never  Substance and Sexual Activity   Alcohol use: Yes    Alcohol/week: 20.0 standard drinks of alcohol    Types: 20 Cans of beer per week    Comment: BAC was clear   Drug use: No    Types: Marijuana    Comment: UDS was clear   Sexual activity: Never    Birth control/protection: Condom  Other Topics Concern   Not on file  Social History Narrative   Not on file   Social Drivers of Health   Financial Resource Strain: Not on file  Food Insecurity: Food Insecurity Present (02/11/2023)   Hunger Vital Sign    Worried About Running Out of Food in the Last Year: Sometimes true    Ran Out of Food in the Last Year: Sometimes true  Transportation Needs: Unmet Transportation Needs (02/11/2023)   PRAPARE - Administrator, Civil Service (Medical): Yes    Lack of Transportation (Non-Medical): Yes  Physical Activity: Not on file  Stress: Not on file  Social Connections: Not on file   Additional Social History:                           Current Medications: Current Facility-Administered Medications  Medication Dose Route Frequency Provider Last Rate Last Admin   acetaminophen (TYLENOL) tablet 650 mg  650 mg Oral Q6H PRN Sindy Guadeloupe, NP   650 mg at 02/11/23 0028   alum & mag hydroxide-simeth (MAALOX/MYLANTA) 200-200-20 MG/5ML suspension 30 mL  30 mL Oral Q4H PRN Sindy Guadeloupe, NP       haloperidol (HALDOL) tablet 5 mg  5 mg Oral TID PRN Jared Whorley, Harrold Donath, MD       And   diphenhydrAMINE (BENADRYL) capsule 50 mg  50 mg Oral TID PRN Peyson Delao, Harrold Donath, MD       haloperidol lactate (HALDOL) injection 5 mg  5 mg Intramuscular TID PRN Taiwo Fish, Harrold Donath, MD       And   diphenhydrAMINE (BENADRYL) injection 50 mg  50 mg Intramuscular TID PRN Zabdi Mis, Harrold Donath, MD       And   LORazepam (ATIVAN) injection 2 mg  2 mg Intramuscular TID PRN Agnieszka Newhouse, Harrold Donath, MD       haloperidol lactate (HALDOL) injection 10 mg  10 mg  Intramuscular TID PRN Etana Beets, Harrold Donath, MD   10 mg at 02/11/23 2112   And   diphenhydrAMINE (BENADRYL) injection 50 mg  50 mg Intramuscular TID PRN Phineas Inches, MD   50 mg at 02/11/23 2110   And   LORazepam (ATIVAN) injection 2 mg  2 mg Intramuscular TID PRN Phineas Inches, MD   2 mg at 02/11/23 2111   hydrOXYzine (ATARAX) tablet 25 mg  25 mg Oral TID PRN Onuoha, Chinwendu V, NP   25 mg at 02/11/23 0029   magnesium hydroxide (MILK OF MAGNESIA) suspension 30 mL  30 mL  Oral Daily PRN Sindy Guadeloupe, NP       risperiDONE (RISPERDAL M-TABS) disintegrating tablet 2 mg  2 mg Oral Daily Teigen Parslow, MD       traZODone (DESYREL) tablet 50 mg  50 mg Oral QHS PRN Onuoha, Chinwendu V, NP   50 mg at 02/11/23 0865    Lab Results: No results found for this or any previous visit (from the past 48 hours).  Blood Alcohol level:  Lab Results  Component Value Date   ETH <10 02/09/2023   ETH <10 08/15/2022    Metabolic Disorder Labs: Lab Results  Component Value Date   HGBA1C 5.1 04/02/2019   MPG 100 04/02/2019   No results found for: "PROLACTIN" Lab Results  Component Value Date   CHOL 173 04/08/2022   TRIG 284 (H) 04/08/2022   HDL 63 04/08/2022   CHOLHDL 2.7 04/08/2022   VLDL 57 (H) 04/08/2022   LDLCALC 53 04/08/2022   LDLCALC 68 04/02/2019    Physical Findings: AIMS:  , ,  ,  ,    CIWA:    COWS:     Musculoskeletal: Strength & Muscle Tone: within normal limits Gait & Station: normal Patient leans: N/A  Psychiatric Specialty Exam:  Presentation  General Appearance:  Bizarre; Disheveled  Eye Contact: Poor  Speech: Garbled  Speech Volume: Normal  Handedness: Right   Mood and Affect  Mood: Anxious; Irritable  Affect: Labile   Thought Process  Thought Processes: Disorganized  Descriptions of Associations:Tangential  Orientation:Partial  Thought Content:Illogical; Delusions; Paranoid Ideation; Tangential  History of  Schizophrenia/Schizoaffective disorder:Yes  Duration of Psychotic Symptoms:Greater than six months  Hallucinations:Hallucinations: None  Ideas of Reference:Delusions; Paranoia  Suicidal Thoughts:Suicidal Thoughts: No  Homicidal Thoughts:Homicidal Thoughts: No   Sensorium  Memory: Immediate Poor; Recent Poor; Remote Fair  Judgment: Impaired  Insight: Lacking   Executive Functions  Concentration: Poor  Attention Span: Poor  Recall: Fair  Fund of Knowledge: Poor  Language: Fair   Psychomotor Activity  Psychomotor Activity: Psychomotor Activity: Normal   Assets  Assets: Communication Skills; Resilience   Sleep  Sleep: Sleep: Fair Number of Hours of Sleep: 4    Physical Exam: Physical Exam Vitals reviewed.  Constitutional:      General: He is not in acute distress.    Appearance: He is not toxic-appearing.  Pulmonary:     Effort: Pulmonary effort is normal. No respiratory distress.  Neurological:     Mental Status: He is alert.     Motor: No weakness.     Gait: Gait normal.    Review of Systems  Constitutional:  Negative for chills and fever.  Cardiovascular:  Negative for chest pain and palpitations.  Neurological:  Negative for dizziness, tingling, tremors and headaches.  Psychiatric/Behavioral:  Negative for depression, hallucinations, memory loss, substance abuse and suicidal ideas. The patient is nervous/anxious. The patient does not have insomnia.   All other systems reviewed and are negative.  Blood pressure 117/79, pulse 91, temperature 97.6 F (36.4 C), temperature source Oral, resp. rate 18, height 5\' 9"  (1.753 m), weight 68 kg, SpO2 98%. Body mass index is 22.14 kg/m.   Treatment Plan Summary: Daily contact with patient to assess and evaluate symptoms and progress in treatment and Medication management   ASSESSMENT:  Diagnoses / Active Problems: Paranoid schizophrenia.  Also consider schizoaffective disorder bipolar  type.  He also has a documented history of bipolar disorder.  PLAN: Safety and Monitoring:  -- Involuntary admission to inpatient psychiatric unit for  safety, stabilization and treatment  -- Daily contact with patient to assess and evaluate symptoms and progress in treatment  -- Patient's case to be discussed in multi-disciplinary team meeting  -- Observation Level : q15 minute checks  -- Vital signs:  q12 hours  -- Precautions: suicide, elopement, and assault  2. Psychiatric Diagnoses and Treatment:     -dc invega - pt refusing -start risperdal 2 mg daily for schizoprenia -Request evaluation for forced medication for objection by a second physician, and if this is approved we will order oral dissolvable Risperdal with backup Haldol IM the patient refuses the oral medication.   --  The risks/benefits/side-effects/alternatives to this medication were discussed in detail with the patient and time was given for questions. The patient consents to medication trial.    -- Metabolic profile and EKG monitoring obtained while on an atypical antipsychotic (BMI: Lipid Panel: HbgA1c: QTc:)   -- Encouraged patient to participate in unit milieu and in scheduled group therapies     3. Medical Issues Being Addressed:     4. Discharge Planning:   -- Social work and case management to assist with discharge planning and identification of hospital follow-up needs prior to discharge  -- Estimated LOS: 5-7 more days  -- Discharge Concerns: Need to establish a safety plan; Medication compliance and effectiveness  -- Discharge Goals: Return home with outpatient referrals for mental health follow-up including medication management/psychotherapy   Phineas Inches, MD 02/12/2023, 11:45 AM  Total Time Spent in Direct Patient Care:  I personally spent 35 minutes on the unit in direct patient care. The direct patient care time included face-to-face time with the patient, reviewing the patient's chart,  communicating with other professionals, and coordinating care. Greater than 50% of this time was spent in counseling or coordinating care with the patient regarding goals of hospitalization, psycho-education, and discharge planning needs.   Phineas Inches, MD Psychiatrist

## 2023-02-12 NOTE — Progress Notes (Signed)
   02/12/23 2015  Psych Admission Type (Psych Patients Only)  Admission Status Involuntary  Psychosocial Assessment  Patient Complaints Depression  Eye Contact Fair  Facial Expression Flat  Affect Anxious  Speech Logical/coherent  Interaction Arrogant  Motor Activity Slow  Appearance/Hygiene Disheveled  Behavior Characteristics Unwilling to participate  Mood Labile;Anxious  Thought Process  Coherency Circumstantial  Content Blaming others  Delusions WDL  Perception Hallucinations  Hallucination Auditory  Judgment Impaired  Confusion WDL  Danger to Self  Current suicidal ideation? Denies  Danger to Others  Danger to Others None reported or observed

## 2023-02-12 NOTE — Plan of Care (Signed)
  Problem: Education: Goal: Emotional status will improve Outcome: Not Progressing Goal: Mental status will improve Outcome: Not Progressing   Problem: Activity: Goal: Interest or engagement in activities will improve Outcome: Not Progressing   

## 2023-02-13 DIAGNOSIS — F203 Undifferentiated schizophrenia: Secondary | ICD-10-CM | POA: Diagnosis not present

## 2023-02-13 NOTE — Progress Notes (Signed)
[facility] Second Physician Opinion Progress Note for Medication Administration to Non-consenting Patients (For Involuntarily Committed Patients)  Patient: [name] Date of Birth: [id number] MRN: [id number]  Reason for the Medication: There is, without the benefit of the specific treatment measure, a significant possibility that the patient will harm self or others before improvement of the patient's condition is realized.  Consideration of Side Effects: Consideration of the side effects related to the medication plan has been given.  Rationale for Medication Administration: Met with the [name] for second physician opinion for possible injectable medications.  [name] was lying down in his bed, awake, alert, oriented to his surroundings and able to cooperate to communicate with this provider.  Patient stated he was brought into the hospital without his consent means involuntary commitment.  Patient has plan to cooperate with primary treatment team, willing to take medication and also focused on discharge as well as possible.  [name] has endorsed his mental illness and also need for taking medications.  He has been vague about his resources in the community.  Patient reports he is willing to take both oral medication and also injectable medication to make him feel much better to control his psychiatric symptoms.  Patient does endorses he has been extremely nervous and have mood swings but denied having any psychosis.  Based on my evaluation patient does not meet criteria for forced medication management as he has been voluntarily willing to take his medications including oral and injectables.    Jeffery Mouse, MD 02/13/23  9:32 AM   This documentation is good for (7) seven days from the date of the MD signature. New documentation must be completed every seven (7) days with detailed justification in the medical record if the patient requires continued non-emergent administration of  psychotropic medications.

## 2023-02-13 NOTE — Progress Notes (Signed)
   02/13/23 2015  Psych Admission Type (Psych Patients Only)  Admission Status Involuntary  Psychosocial Assessment  Eye Contact Fair  Facial Expression Flat  Affect Anxious  Speech Logical/coherent  Interaction Arrogant  Motor Activity Slow  Appearance/Hygiene Disheveled  Behavior Characteristics Cooperative  Mood Anxious  Thought Process  Coherency Circumstantial  Content Blaming others  Delusions WDL  Perception Hallucinations  Hallucination Auditory  Judgment Impaired  Confusion WDL  Danger to Self  Current suicidal ideation? Denies  Danger to Others  Danger to Others None reported or observed

## 2023-02-13 NOTE — Progress Notes (Signed)
   02/13/23 0733  Psych Admission Type (Psych Patients Only)  Admission Status Involuntary  Psychosocial Assessment  Patient Complaints Sadness  Eye Contact Fair  Facial Expression Flat  Affect Anxious  Speech Logical/coherent  Interaction Arrogant  Motor Activity Slow  Appearance/Hygiene Disheveled  Behavior Characteristics Cooperative  Mood Anxious  Thought Process  Coherency Circumstantial  Content Blaming others  Delusions None reported or observed  Perception Hallucinations  Hallucination Auditory  Judgment Impaired  Confusion None  Danger to Self  Current suicidal ideation? Denies  Danger to Others  Danger to Others None reported or observed

## 2023-02-13 NOTE — Progress Notes (Signed)
[facility] MD Progress Note  02/13/2023 12:53 PM [name]  MRN:  [id number]  Subjective:    This is one of several psychiatric admissions in this [name] for this 52 year old Caucasian male with probable hx of chronic mental illnesses & substance use disorder. Admitted to the [facility] from the [name] ED. Apparently, patient presented to the [facility] ED with complaint of homicidal ideations to no one in particular. However, chart review indicated that he may not have been taken his mental health medications for a while. Patient reported while at the ED that the voices were bothering him & was actively responding to internal stimuli. After medical evaluation/clearance, patient was transferred to the [facility] for further psychiatric evaluation/treatments.   Yesterday the psychiatry team made the following recommendations: -dc invega - pt refusing -start risperdal 2 mg daily for schizoprenia  Overnight, nurses state the patient was yelling out, screaming, irritable but did not require as needed medication for agitation.  I did talk with the physician who evaluated the patient yesterday for forced medications.  Physician stated that the patient stated he would take his medications (but he did not) and therefore that that physician stated he did not meet criteria for forced medication order.  On my evaluation today, I discussed again with the patient importance of taking psychiatric medication and he was ultimately agreeable.  Per nursing after I agree with the patient, the patient did take his first dose of scheduled psychiatric medications since being admitted 3 days ago.  On my evaluation, the patient is more linear in his thought process.  Overall his thoughts are less disorganized.  He continues to have illogical paranoid thoughts.  He is fixated on certain themes such as staff coming at him, being interrupted throughout the night by staff, which he believes is on purpose and does not  realize after much explanation that every 15-minute checks are standard on the psychiatric unit.  He also is fixated on getting his records from [name]. He states sleep is better.  Concentration is somewhat better.  Appetite is okay.  He denies any AH or VH.  Denies any SI or HI.     Principal Problem: Paranoid schizophrenia (HCC) Diagnosis: Principal Problem:   Paranoid schizophrenia (HCC)  Total Time spent with patient: 20 minutes  Past Psychiatric History:  Per previous notes:  Reports history of prior psychiatric admissions, most recently in February 2019. At the time was admitted for psychotic symptoms, was diagnosed Schizoaffective Disorder, treated with Abilify long acting IM management, which he states was more recently changed to [place] Q 3 months  . Reports history of a prior suicide attempt " many years ago" by overdosing . He reports he has been diagnosed with Bipolar Disorder in the past .  Past Medical History:  Past Medical History:  Diagnosis Date   Anxiety    Bipolar 1 disorder (HCC)    Schizoaffective disorder, bipolar type (HCC)    Stroke [name])     Past Surgical History:  Procedure Laterality Date   CHOLECYSTECTOMY N/A 12/24/2012   Procedure: LAPAROSCOPIC CHOLECYSTECTOMY;  Surgeon: Cherylynn Ridges, MD;  Location: [facility] OR;  Service: General;  Laterality: N/A;   FINGER SURGERY     Family History:  Family History  Problem Relation Age of Onset   Heart disease Mother    Family Psychiatric  History: See H&P  Social History:  Social History   Substance and Sexual Activity  Alcohol Use Yes  Alcohol/week: 20.0 standard drinks of alcohol   Types: 20 Cans of beer per week   Comment: BAC was clear     Social History   Substance and Sexual Activity  Drug Use No   Types: Marijuana   Comment: UDS was clear    Social History   Socioeconomic History   Marital status: Divorced    Spouse name: Not on file   Number of children: Not on file   Years  of education: Not on file   Highest education level: Not on file  Occupational History   Not on file  Tobacco Use   Smoking status: Every Day    Current packs/day: 1.00    Average packs/day: 1 pack/day for 5.0 years (5.0 ttl pk-yrs)    Types: Cigarettes   Smokeless tobacco: Never  Substance and Sexual Activity   Alcohol use: Yes    Alcohol/week: 20.0 standard drinks of alcohol    Types: 20 Cans of beer per week    Comment: BAC was clear   Drug use: No    Types: Marijuana    Comment: UDS was clear   Sexual activity: Never    Birth control/protection: Condom  Other Topics Concern   Not on file  Social History Narrative   Not on file   Social Drivers of Health   Financial Resource Strain: Not on file  Food Insecurity: Food Insecurity Present (02/11/2023)   Hunger Vital Sign    Worried About Running Out of Food in the Last Year: Sometimes true    Ran Out of Food in the Last Year: Sometimes true  Transportation Needs: Unmet Transportation Needs (02/11/2023)   PRAPARE - Administrator, Civil Service (Medical): Yes    Lack of Transportation (Non-Medical): Yes  Physical Activity: Not on file  Stress: Not on file  Social Connections: Not on file   Additional Social History:                           Current Medications: Current Facility-Administered Medications  Medication Dose Route Frequency Provider Last Rate Last Admin   acetaminophen (TYLENOL) tablet 650 mg  650 mg Oral Q6H PRN Sindy Guadeloupe, NP   650 mg at 02/13/23 0733   alum & mag hydroxide-simeth (MAALOX/MYLANTA) 200-200-20 MG/5ML suspension 30 mL  30 mL Oral Q4H PRN Sindy Guadeloupe, NP       haloperidol (HALDOL) tablet 5 mg  5 mg Oral TID PRN Ammarie Matsuura, Harrold Donath, MD       And   diphenhydrAMINE (BENADRYL) capsule 50 mg  50 mg Oral TID PRN Yovanny Coats, Harrold Donath, MD       haloperidol lactate (HALDOL) injection 5 mg  5 mg Intramuscular TID PRN Ines Rebel, Harrold Donath, MD       And   diphenhydrAMINE  (BENADRYL) injection 50 mg  50 mg Intramuscular TID PRN Evangelos Paulino, Harrold Donath, MD       And   LORazepam (ATIVAN) injection 2 mg  2 mg Intramuscular TID PRN Javari Bufkin, Harrold Donath, MD       haloperidol lactate (HALDOL) injection 10 mg  10 mg Intramuscular TID PRN Phineas Inches, MD   10 mg at 02/11/23 2112   And   diphenhydrAMINE (BENADRYL) injection 50 mg  50 mg Intramuscular TID PRN Phineas Inches, MD   50 mg at 02/11/23 2110   And   LORazepam (ATIVAN) injection 2 mg  2 mg Intramuscular TID PRN Phineas Inches, MD   2 mg at  02/11/23 2111   hydrOXYzine (ATARAX) tablet 25 mg  25 mg Oral TID PRN Onuoha, Chinwendu V, NP   25 mg at 02/11/23 0029   magnesium hydroxide (MILK OF MAGNESIA) suspension 30 mL  30 mL Oral Daily PRN Sindy Guadeloupe, NP       risperiDONE (RISPERDAL M-TABS) disintegrating tablet 2 mg  2 mg Oral Daily Eaden Hettinger, MD   2 mg at 02/13/23 0733   traZODone (DESYREL) tablet 50 mg  50 mg Oral QHS PRN Onuoha, Chinwendu V, NP   50 mg at 02/11/23 1610    Lab Results: No results found for this or any previous visit (from the past 48 hours).  Blood Alcohol level:  Lab Results  Component Value Date   ETH <10 02/09/2023   ETH <10 08/15/2022    Metabolic Disorder Labs: Lab Results  Component Value Date   HGBA1C 5.1 04/02/2019   MPG 100 04/02/2019   No results found for: "PROLACTIN" Lab Results  Component Value Date   CHOL 173 04/08/2022   TRIG 284 (H) 04/08/2022   HDL 63 04/08/2022   CHOLHDL 2.7 04/08/2022   VLDL 57 (H) 04/08/2022   LDLCALC 53 04/08/2022   LDLCALC 68 04/02/2019    Physical Findings: AIMS:  , ,  ,  ,    CIWA:    COWS:     Musculoskeletal: Strength & Muscle Tone: within normal limits Gait & Station: normal Patient leans: N/A  Psychiatric Specialty Exam:  Presentation  General Appearance:  Disheveled  Eye Contact: Poor  Speech: Garbled  Speech Volume: Normal  Handedness: Right   Mood and Affect   Mood: Anxious  Affect: Constricted   Thought Process  Thought Processes: Disorganized  Descriptions of Associations:Tangential  Orientation:Partial  Thought Content:Paranoid Ideation  History of Schizophrenia/Schizoaffective disorder:No  Duration of Psychotic Symptoms:Greater than six months  Hallucinations:Hallucinations: None  Ideas of Reference:Paranoia  Suicidal Thoughts:Suicidal Thoughts: No  Homicidal Thoughts:Homicidal Thoughts: No   Sensorium  Memory: Immediate Fair; Recent Fair; Remote Fair  Judgment: Impaired  Insight: Lacking   Executive Functions  Concentration: Poor  Attention Span: Poor  Recall: Good  Fund of Knowledge: Good  Language: Good   Psychomotor Activity  Psychomotor Activity: Psychomotor Activity: Normal   Assets  Assets: Communication Skills; Resilience   Sleep  Sleep: Sleep: Fair    Physical Exam: Physical Exam Vitals reviewed.  Constitutional:      General: He is not in acute distress.    Appearance: He is not toxic-appearing.  Pulmonary:     Effort: Pulmonary effort is normal. No respiratory distress.  Neurological:     Mental Status: He is alert.     Motor: No weakness.     Gait: Gait normal.    Review of Systems  Constitutional:  Negative for chills and fever.  Cardiovascular:  Negative for chest pain and palpitations.  Neurological:  Negative for dizziness, tingling, tremors and headaches.  Psychiatric/Behavioral:  Negative for depression, hallucinations, memory loss, substance abuse and suicidal ideas. The patient is nervous/anxious. The patient does not have insomnia.   All other systems reviewed and are negative.  Blood pressure 105/62, pulse 84, temperature 97.6 F (36.4 C), temperature source Oral, resp. rate 18, height 5\' 9"  (1.753 m), weight 68 kg, SpO2 98%. Body mass index is 22.14 kg/m.   Treatment Plan Summary: Daily contact with patient to assess and evaluate symptoms and  progress in treatment and Medication management   ASSESSMENT:  Diagnoses / Active Problems: Paranoid schizophrenia.  Also consider schizoaffective disorder bipolar type.  He also has a documented history of bipolar disorder.  PLAN: Safety and Monitoring:  -- Involuntary admission to inpatient psychiatric unit for safety, stabilization and treatment  -- Daily contact with patient to assess and evaluate symptoms and progress in treatment  -- Patient's case to be discussed in multi-disciplinary team meeting  -- Observation Level : q15 minute checks  -- Vital signs:  q12 hours  -- Precautions: suicide, elopement, and assault  2. Psychiatric Diagnoses and Treatment:     -Continue risperdal 2 mg daily for schizoprenia -patient is on refusing this medication until today, 12/22.   --  The risks/benefits/side-effects/alternatives to this medication were discussed in detail with the patient and time was given for questions. The patient consents to medication trial.    -- Metabolic profile and EKG monitoring obtained while on an atypical antipsychotic (BMI: Lipid Panel: HbgA1c: QTc:)   -- Encouraged patient to participate in unit milieu and in scheduled group therapies     3. Medical Issues Being Addressed:     4. Discharge Planning:   -- Social work and case management to assist with discharge planning and identification of hospital follow-up needs prior to discharge  -- Estimated LOS: 5-7 more days  -- Discharge Concerns: Need to establish a safety plan; Medication compliance and effectiveness  -- Discharge Goals: Return home with outpatient referrals for mental health follow-up including medication management/psychotherapy   Phineas Inches, MD 02/13/2023, 12:53 PM  Total Time Spent in Direct Patient Care:  I personally spent 35 minutes on the unit in direct patient care. The direct patient care time included face-to-face time with the patient, reviewing the patient's chart,  communicating with other professionals, and coordinating care. Greater than 50% of this time was spent in counseling or coordinating care with the patient regarding goals of hospitalization, psycho-education, and discharge planning needs.   Phineas Inches, MD Psychiatrist

## 2023-02-13 NOTE — Plan of Care (Signed)
  Problem: Education: Goal: Emotional status will improve Outcome: Progressing Goal: Mental status will improve Outcome: Progressing   Problem: Health Behavior/Discharge Planning: Goal: Compliance with treatment plan for underlying cause of condition will improve Outcome: Progressing   

## 2023-02-13 NOTE — Group Note (Signed)
Date:  02/13/2023 Time:  8:52 PM  Group Topic/Focus:  Wrap-Up Group:   The focus of this group is to help patients review their daily goal of treatment and discuss progress on daily workbooks.    Participation Level:  Did Not Attend   Scot Dock 02/13/2023, 8:52 PM

## 2023-02-13 NOTE — Plan of Care (Signed)
  Problem: Education: Goal: Emotional status will improve Outcome: Progressing Goal: Mental status will improve Outcome: Progressing   Problem: Activity: Goal: Interest or engagement in activities will improve Outcome: Progressing Goal: Sleeping patterns will improve Outcome: Progressing   Problem: Safety: Goal: Periods of time without injury will increase Outcome: Progressing   Problem: Safety: Goal: Ability to redirect hostility and anger into socially appropriate behaviors will improve Outcome: Progressing

## 2023-02-13 NOTE — Progress Notes (Signed)
Pt up yelling and cursing , pt informed that we understand if he was upset, but he needed to respect the other patients on the unit that are trying to sleep.

## 2023-02-14 DIAGNOSIS — F203 Undifferentiated schizophrenia: Secondary | ICD-10-CM | POA: Diagnosis not present

## 2023-02-14 NOTE — Group Note (Signed)
Date:  02/14/2023 Time:  9:26 PM  Group Topic/Focus:  Wrap-Up Group:   The focus of this group is to help patients review their daily goal of treatment and discuss progress on daily workbooks.    Participation Level:  Did Not Attend   Scot Dock 02/14/2023, 9:26 PM

## 2023-02-14 NOTE — Progress Notes (Signed)
   02/14/23 1945  Psych Admission Type (Psych Patients Only)  Admission Status Involuntary  Psychosocial Assessment  Patient Complaints Anxiety  Eye Contact Fair  Facial Expression Flat  Affect Anxious  Speech Logical/coherent  Interaction Arrogant  Motor Activity Slow  Appearance/Hygiene Disheveled  Behavior Characteristics Cooperative  Mood Anxious  Thought Process  Coherency Circumstantial  Content Blaming others  Delusions WDL  Perception Hallucinations  Hallucination Auditory  Judgment Impaired  Confusion WDL  Danger to Self  Current suicidal ideation? Denies  Danger to Others  Danger to Others None reported or observed

## 2023-02-14 NOTE — Progress Notes (Signed)
[name] MD Progress Note  02/14/2023 3:20 PM [name]  MRN:  [id number]  Subjective:    This is one of several psychiatric admissions in this [facility] for this 52 year old Caucasian male with probable hx of chronic mental illnesses & substance use disorder. Admitted to the [facility] from the [facility] ED. Apparently, patient presented to the [facility] ED with complaint of homicidal ideations to no one in particular. However, chart review indicated that he may not have been taken his mental health medications for a while. Patient reported while at the ED that the voices were bothering him & was actively responding to internal stimuli. After medical evaluation/clearance, patient was transferred to the [facility] for further psychiatric evaluation/treatments.   Yesterday the psychiatry team made the following recommendations: -Continue risperdal 2 mg daily for schizoprenia  Per nursing, there were she is with agitation overnight, which is a significant improvement as the patient has had behavioral outburst since admission up until last night.  They report he is slept 10.5 hours last night.  Patient states that since starting the first dose of his medication, Since starting risperdal yesterday, he reports that he feels okay without any side effects.  Overall he is more linear and organized in his thoughts.  His thoughts are more reality based and logical.  This is a significant improvement in regards to his thought disorder.  Patient denies any trouble with sleep.  Reports that his anxiety level is less.  Denies having any depression or sadness.  Denies any SI or HI.  Denies any AH or VH.  He does not present with any paranoid or delusional thought constructs today.     Principal Problem: Paranoid schizophrenia (HCC) Diagnosis: Principal Problem:   Paranoid schizophrenia (HCC)  Total Time spent with patient: 20 minutes  Past Psychiatric History:  Per previous notes:  Reports history of  prior psychiatric admissions, most recently in February 2019. At the time was admitted for psychotic symptoms, was diagnosed Schizoaffective Disorder, treated with Abilify long acting IM management, which he states was more recently changed to [place] Q 3 months  . Reports history of a prior suicide attempt " many years ago" by overdosing . He reports he has been diagnosed with Bipolar Disorder in the past .  Past Medical History:  Past Medical History:  Diagnosis Date   Anxiety    Bipolar 1 disorder (HCC)    Schizoaffective disorder, bipolar type (HCC)    Stroke [facility])     Past Surgical History:  Procedure Laterality Date   CHOLECYSTECTOMY N/A 12/24/2012   Procedure: LAPAROSCOPIC CHOLECYSTECTOMY;  Surgeon: Cherylynn Ridges, MD;  Location: [facility] OR;  Service: General;  Laterality: N/A;   FINGER SURGERY     Family History:  Family History  Problem Relation Age of Onset   Heart disease Mother    Family Psychiatric  History: See H&P  Social History:  Social History   Substance and Sexual Activity  Alcohol Use Yes   Alcohol/week: 20.0 standard drinks of alcohol   Types: 20 Cans of beer per week   Comment: BAC was clear     Social History   Substance and Sexual Activity  Drug Use No   Types: Marijuana   Comment: UDS was clear    Social History   Socioeconomic History   Marital status: Divorced    Spouse name: Not on file   Number of children: Not on file   Years of education: Not on file  Highest education level: Not on file  Occupational History   Not on file  Tobacco Use   Smoking status: Every Day    Current packs/day: 1.00    Average packs/day: 1 pack/day for 5.0 years (5.0 ttl pk-yrs)    Types: Cigarettes   Smokeless tobacco: Never  Substance and Sexual Activity   Alcohol use: Yes    Alcohol/week: 20.0 standard drinks of alcohol    Types: 20 Cans of beer per week    Comment: BAC was clear   Drug use: No    Types: Marijuana    Comment: UDS was clear   Sexual  activity: Never    Birth control/protection: Condom  Other Topics Concern   Not on file  Social History Narrative   Not on file   Social Drivers of Health   Financial Resource Strain: Not on file  Food Insecurity: Food Insecurity Present (02/11/2023)   Hunger Vital Sign    Worried About Running Out of Food in the Last Year: Sometimes true    Ran Out of Food in the Last Year: Sometimes true  Transportation Needs: Unmet Transportation Needs (02/11/2023)   PRAPARE - Administrator, Civil Service (Medical): Yes    Lack of Transportation (Non-Medical): Yes  Physical Activity: Not on file  Stress: Not on file  Social Connections: Not on file   Additional Social History:                           Current Medications: Current Facility-Administered Medications  Medication Dose Route Frequency Provider Last Rate Last Admin   acetaminophen (TYLENOL) tablet 650 mg  650 mg Oral Q6H PRN Sindy Guadeloupe, NP   650 mg at 02/13/23 0733   alum & mag hydroxide-simeth (MAALOX/MYLANTA) 200-200-20 MG/5ML suspension 30 mL  30 mL Oral Q4H PRN Sindy Guadeloupe, NP       haloperidol (HALDOL) tablet 5 mg  5 mg Oral TID PRN Loyalty Arentz, Harrold Donath, MD       And   diphenhydrAMINE (BENADRYL) capsule 50 mg  50 mg Oral TID PRN Zakery Normington, Harrold Donath, MD       haloperidol lactate (HALDOL) injection 5 mg  5 mg Intramuscular TID PRN Kydan Shanholtzer, Harrold Donath, MD       And   diphenhydrAMINE (BENADRYL) injection 50 mg  50 mg Intramuscular TID PRN Barry Faircloth, Harrold Donath, MD       And   LORazepam (ATIVAN) injection 2 mg  2 mg Intramuscular TID PRN Marilin Kofman, Harrold Donath, MD       haloperidol lactate (HALDOL) injection 10 mg  10 mg Intramuscular TID PRN Phineas Inches, MD   10 mg at 02/11/23 2112   And   diphenhydrAMINE (BENADRYL) injection 50 mg  50 mg Intramuscular TID PRN Phineas Inches, MD   50 mg at 02/11/23 2110   And   LORazepam (ATIVAN) injection 2 mg  2 mg Intramuscular TID PRN Phineas Inches, MD   2  mg at 02/11/23 2111   hydrOXYzine (ATARAX) tablet 25 mg  25 mg Oral TID PRN Onuoha, Chinwendu V, NP   25 mg at 02/13/23 2035   magnesium hydroxide (MILK OF MAGNESIA) suspension 30 mL  30 mL Oral Daily PRN Sindy Guadeloupe, NP       risperiDONE (RISPERDAL M-TABS) disintegrating tablet 2 mg  2 mg Oral Daily Kristopher Delk, MD   2 mg at 02/14/23 0851   traZODone (DESYREL) tablet 50 mg  50 mg Oral QHS PRN Onuoha,  Chinwendu V, NP   50 mg at 02/13/23 2036    Lab Results: No results found for this or any previous visit (from the past 48 hours).  Blood Alcohol level:  Lab Results  Component Value Date   ETH <10 02/09/2023   ETH <10 08/15/2022    Metabolic Disorder Labs: Lab Results  Component Value Date   HGBA1C 5.1 04/02/2019   MPG 100 04/02/2019   No results found for: "PROLACTIN" Lab Results  Component Value Date   CHOL 173 04/08/2022   TRIG 284 (H) 04/08/2022   HDL 63 04/08/2022   CHOLHDL 2.7 04/08/2022   VLDL 57 (H) 04/08/2022   LDLCALC 53 04/08/2022   LDLCALC 68 04/02/2019    Physical Findings: AIMS:  , ,  ,  ,    CIWA:    COWS:     Musculoskeletal: Strength & Muscle Tone: within normal limits Gait & Station: normal Patient leans: N/A  Psychiatric Specialty Exam:  Presentation  General Appearance:  Casual  Eye Contact: Fair  Speech: Normal Rate  Speech Volume: Normal  Handedness: Right   Mood and Affect  Mood: Anxious  Affect: Depressed   Thought Process  Thought Processes: Linear  Descriptions of Associations:Tangential  Orientation:Full (Time, Place and Person)  Thought Content:Logical  History of Schizophrenia/Schizoaffective disorder:Yes  Duration of Psychotic Symptoms:Greater than six months  Hallucinations:Hallucinations: None  Ideas of Reference:None  Suicidal Thoughts:Suicidal Thoughts: No  Homicidal Thoughts:Homicidal Thoughts: No   Sensorium  Memory: Immediate Fair; Recent Fair; Remote  Fair  Judgment: Fair  Insight: Fair   Art therapist  Concentration: Fair  Attention Span: Fair  Recall: Good  Fund of Knowledge: Good  Language: Good   Psychomotor Activity  Psychomotor Activity: Psychomotor Activity: Normal   Assets  Assets: Communication Skills; Resilience   Sleep  Sleep: Sleep: Fair    Physical Exam: Physical Exam Vitals reviewed.  Constitutional:      General: He is not in acute distress.    Appearance: He is not toxic-appearing.  Pulmonary:     Effort: Pulmonary effort is normal. No respiratory distress.  Neurological:     Mental Status: He is alert.     Motor: No weakness.     Gait: Gait normal.  Psychiatric:        Behavior: Behavior normal.    Review of Systems  Constitutional:  Negative for chills and fever.  Cardiovascular:  Negative for chest pain and palpitations.  Neurological:  Negative for dizziness, tingling, tremors and headaches.  Psychiatric/Behavioral:  Negative for depression, hallucinations, memory loss, substance abuse and suicidal ideas. The patient is nervous/anxious. The patient does not have insomnia.   All other systems reviewed and are negative.  Blood pressure (!) 89/47, pulse 62, temperature 97.6 F (36.4 C), temperature source Oral, resp. rate 18, height 5\' 9"  (1.753 m), weight 68 kg, SpO2 98%. Body mass index is 22.14 kg/m.   Treatment Plan Summary: Daily contact with patient to assess and evaluate symptoms and progress in treatment and Medication management   ASSESSMENT:  Diagnoses / Active Problems: Paranoid schizophrenia.  Also consider schizoaffective disorder bipolar type.  He also has a documented history of bipolar disorder.  PLAN: Safety and Monitoring:  -- Involuntary admission to inpatient psychiatric unit for safety, stabilization and treatment  -- Daily contact with patient to assess and evaluate symptoms and progress in treatment  -- Patient's case to be discussed in  multi-disciplinary team meeting  -- Observation Level : q15 minute checks  --  Vital signs:  q12 hours  -- Precautions: suicide, elopement, and assault  2. Psychiatric Diagnoses and Treatment:     -Continue risperdal 2 mg daily for schizoprenia -patient is on refusing this medication until today, 12/22.   --  The risks/benefits/side-effects/alternatives to this medication were discussed in detail with the patient and time was given for questions. The patient consents to medication trial.    -- Metabolic profile and EKG monitoring obtained while on an atypical antipsychotic (BMI: Lipid Panel: HbgA1c: QTc:)   -- Encouraged patient to participate in unit milieu and in scheduled group therapies     3. Medical Issues Being Addressed:     4. Discharge Planning:   -- Social work and case management to assist with discharge planning and identification of hospital follow-up needs prior to discharge  -- Estimated LOS: 5-7 more days  -- Discharge Concerns: Need to establish a safety plan; Medication compliance and effectiveness  -- Discharge Goals: Return home with outpatient referrals for mental health follow-up including medication management/psychotherapy   Phineas Inches, MD 02/14/2023, 3:20 PM  Total Time Spent in Direct Patient Care:  I personally spent 25 minutes on the unit in direct patient care. The direct patient care time included face-to-face time with the patient, reviewing the patient's chart, communicating with other professionals, and coordinating care. Greater than 50% of this time was spent in counseling or coordinating care with the patient regarding goals of hospitalization, psycho-education, and discharge planning needs.   Phineas Inches, MD Psychiatrist

## 2023-02-14 NOTE — Group Note (Signed)
Recreation Therapy Group Note   Group Topic:Problem Solving  Group Date: 02/14/2023 Start Time: 1005 End Time: 1035 Facilitators: Tyisha Cressy-McCall, LRT,CTRS Location: [address] Dayroom   Group Topic: Communication, Team Building, Problem Solving  Goal Area(s) Addresses:  Patient will effectively work with peer towards shared goal.  Patient will identify skills used to make activity successful.  Patient will identify how skills used during activity can be applied to reach post d/c goals.   Intervention: STEM Activity- Glass blower/designer  Group Description: Tallest Pharmacist, community. In teams of 5-6, patients were given 11 craft pipe cleaners. Using the materials provided, patients were instructed to compete again the opposing team(s) to build the tallest free-standing structure from floor level. The activity was timed; difficulty increased by Clinical research associate as Production designer, theatre/television/film continued.  Systematically resources were removed with additional directions for example, placing one arm behind their back, working in silence, and shape stipulations. LRT facilitated post-activity discussion reviewing team processes and necessary communication skills involved in completion. Patients were encouraged to reflect how the skills utilized, or not utilized, in this activity can be incorporated to positively impact support systems post discharge.  Education: Pharmacist, community, Scientist, physiological, Discharge Planning   Education Outcome: Acknowledges education/In group clarification offered/Needs additional education.    Affect/Mood: N/A   Participation Level: Did not attend    Clinical Observations/Individualized Feedback:     Plan: Continue to engage patient in RT group sessions 2-3x/week.   Debbrah Sampedro-McCall, LRT,CTRS 02/14/2023 12:27 PM

## 2023-02-14 NOTE — Group Note (Signed)
LCSW Group Therapy Note   Group Date: 02/14/2023 Start Time: 1300 End Time: 1400   Topic:   Due to the acuity and complex discharge plans, group was not held. Patient was provided therapeutic worksheets and asked to meet with CSW as needed.   Kathrynn Humble 02/14/2023  2:37 PM

## 2023-02-14 NOTE — Progress Notes (Signed)
   02/14/23 1300  Psych Admission Type (Psych Patients Only)  Admission Status Involuntary  Psychosocial Assessment  Patient Complaints Anxiety  Eye Contact Fair  Facial Expression Anxious  Affect Anxious  Speech Logical/coherent  Interaction Assertive  Motor Activity Slow  Appearance/Hygiene In scrubs  Behavior Characteristics Cooperative  Mood Anxious  Thought Process  Coherency Circumstantial  Content Blaming others  Delusions None reported or observed  Perception Hallucinations  Hallucination Auditory  Judgment Impaired  Confusion None  Danger to Self  Current suicidal ideation? Denies  Danger to Others  Danger to Others None reported or observed

## 2023-02-14 NOTE — Plan of Care (Signed)
  Problem: Education: Goal: Emotional status will improve Outcome: Progressing Goal: Mental status will improve Outcome: Progressing   

## 2023-02-14 NOTE — Plan of Care (Signed)
  Problem: Education: Goal: Emotional status will improve Outcome: Progressing Goal: Mental status will improve Outcome: Progressing   Problem: Activity: Goal: Sleeping patterns will improve Outcome: Progressing   Problem: Safety: Goal: Periods of time without injury will increase Outcome: Progressing   Problem: Safety: Goal: Ability to demonstrate self-control will improve Outcome: Progressing   Problem: Activity: Goal: Interest or engagement in activities will improve Outcome: Not Progressing

## 2023-02-15 ENCOUNTER — Telehealth (HOSPITAL_COMMUNITY): Payer: Self-pay | Admitting: Pharmacy Technician

## 2023-02-15 ENCOUNTER — Other Ambulatory Visit (HOSPITAL_COMMUNITY): Payer: Self-pay

## 2023-02-15 DIAGNOSIS — F203 Undifferentiated schizophrenia: Secondary | ICD-10-CM | POA: Diagnosis not present

## 2023-02-15 MED ORDER — ENSURE ENLIVE PO LIQD
237.0000 mL | Freq: Two times a day (BID) | ORAL | Status: DC
  Administered 2023-02-15 – 2023-02-18 (×7): 237 mL via ORAL
  Filled 2023-02-15 (×9): qty 237

## 2023-02-15 MED ORDER — RISPERIDONE ER 50 MG/0.14ML ~~LOC~~ SUSY
50.0000 mg | PREFILLED_SYRINGE | SUBCUTANEOUS | Status: DC
  Administered 2023-02-16: 50 mg via SUBCUTANEOUS

## 2023-02-15 NOTE — Progress Notes (Signed)
Pt complaining of arthritis pain, pt does not have any pain medication for pain level above 3 out of 10.

## 2023-02-15 NOTE — Progress Notes (Signed)
[facility] MD Progress Note  02/15/2023 2:25 PM [name]  MRN:  [id number]  Subjective:    This is one of several psychiatric admissions in this [facility] for this 52 year old Caucasian male with probable hx of chronic mental illnesses & substance use disorder. Admitted to the [facility] from the [facility] ED. Apparently, patient presented to the [facility] ED with complaint of homicidal ideations to no one in particular. However, chart review indicated that he may not have been taken his mental health medications for a while. Patient reported while at the ED that the voices were bothering him & was actively responding to internal stimuli. After medical evaluation/clearance, patient was transferred to the [facility] for further psychiatric evaluation/treatments.   Yesterday the psychiatry team made the following recommendations: -Continue risperdal 2 mg daily for schizoprenia  On my assessment today, patient is more logical and linear.  Thoughts are less disorganized.  Mood is more stable.  Is not irritable.  Patient reports he feels calmer and "I feel like I am not having as many mood swings".  Reports sleep is okay.  Reports appetite is okay.  Concentration is better.  Denies any AH VH, HI or SI.  We discussed again eating and LAI.  Per pharmacy, we can start the Risperdal LAI.  There was an issue with case management for his insurance, thinking he was still in jail, but this was straightened out.  Otherwise patient denies any side effects to current psychiatric medications.  He does not appear delusional or paranoid.       Principal Problem: Paranoid schizophrenia (HCC) Diagnosis: Principal Problem:   Paranoid schizophrenia (HCC)  Total Time spent with patient: 20 minutes  Past Psychiatric History:  Per previous notes:  Reports history of prior psychiatric admissions, most recently in February 2019. At the time was admitted for psychotic symptoms, was diagnosed Schizoaffective Disorder,  treated with Abilify long acting IM management, which he states was more recently changed to [place] Q 3 months  . Reports history of a prior suicide attempt " many years ago" by overdosing . He reports he has been diagnosed with Bipolar Disorder in the past .  Past Medical History:  Past Medical History:  Diagnosis Date   Anxiety    Bipolar 1 disorder (HCC)    Schizoaffective disorder, bipolar type (HCC)    Stroke [facility])     Past Surgical History:  Procedure Laterality Date   CHOLECYSTECTOMY N/A 12/24/2012   Procedure: LAPAROSCOPIC CHOLECYSTECTOMY;  Surgeon: Cherylynn Ridges, MD;  Location: [facility] OR;  Service: General;  Laterality: N/A;   FINGER SURGERY     Family History:  Family History  Problem Relation Age of Onset   Heart disease Mother    Family Psychiatric  History: See H&P  Social History:  Social History   Substance and Sexual Activity  Alcohol Use Yes   Alcohol/week: 20.0 standard drinks of alcohol   Types: 20 Cans of beer per week   Comment: BAC was clear     Social History   Substance and Sexual Activity  Drug Use No   Types: Marijuana   Comment: UDS was clear    Social History   Socioeconomic History   Marital status: Divorced    Spouse name: Not on file   Number of children: Not on file   Years of education: Not on file   Highest education level: Not on file  Occupational History   Not on file  Tobacco Use  Smoking status: Every Day    Current packs/day: 1.00    Average packs/day: 1 pack/day for 5.0 years (5.0 ttl pk-yrs)    Types: Cigarettes   Smokeless tobacco: Never  Substance and Sexual Activity   Alcohol use: Yes    Alcohol/week: 20.0 standard drinks of alcohol    Types: 20 Cans of beer per week    Comment: BAC was clear   Drug use: No    Types: Marijuana    Comment: UDS was clear   Sexual activity: Never    Birth control/protection: Condom  Other Topics Concern   Not on file  Social History Narrative   Not on file   Social Drivers of  Health   Financial Resource Strain: Not on file  Food Insecurity: Food Insecurity Present (02/11/2023)   Hunger Vital Sign    Worried About Running Out of Food in the Last Year: Sometimes true    Ran Out of Food in the Last Year: Sometimes true  Transportation Needs: Unmet Transportation Needs (02/11/2023)   PRAPARE - Administrator, Civil Service (Medical): Yes    Lack of Transportation (Non-Medical): Yes  Physical Activity: Not on file  Stress: Not on file  Social Connections: Not on file   Additional Social History:                           Current Medications: Current Facility-Administered Medications  Medication Dose Route Frequency Provider Last Rate Last Admin   acetaminophen (TYLENOL) tablet 650 mg  650 mg Oral Q6H PRN Sindy Guadeloupe, NP   650 mg at 02/15/23 1141   alum & mag hydroxide-simeth (MAALOX/MYLANTA) 200-200-20 MG/5ML suspension 30 mL  30 mL Oral Q4H PRN Sindy Guadeloupe, NP       haloperidol (HALDOL) tablet 5 mg  5 mg Oral TID PRN Lattie Cervi, Harrold Donath, MD       And   diphenhydrAMINE (BENADRYL) capsule 50 mg  50 mg Oral TID PRN Lorry Furber, Harrold Donath, MD       haloperidol lactate (HALDOL) injection 5 mg  5 mg Intramuscular TID PRN Yemariam Magar, Harrold Donath, MD       And   diphenhydrAMINE (BENADRYL) injection 50 mg  50 mg Intramuscular TID PRN Rowe Warman, Harrold Donath, MD       And   LORazepam (ATIVAN) injection 2 mg  2 mg Intramuscular TID PRN [place] Markgraf, Harrold Donath, MD       haloperidol lactate (HALDOL) injection 10 mg  10 mg Intramuscular TID PRN Phineas Inches, MD   10 mg at 02/11/23 2112   And   diphenhydrAMINE (BENADRYL) injection 50 mg  50 mg Intramuscular TID PRN Phineas Inches, MD   50 mg at 02/11/23 2110   And   LORazepam (ATIVAN) injection 2 mg  2 mg Intramuscular TID PRN Phineas Inches, MD   2 mg at 02/11/23 2111   feeding supplement (ENSURE ENLIVE / ENSURE PLUS) liquid 237 mL  237 mL Oral BID BM Davey Bergsma, Harrold Donath, MD   237 mL at 02/15/23 1013    hydrOXYzine (ATARAX) tablet 25 mg  25 mg Oral TID PRN Onuoha, Chinwendu V, NP   25 mg at 02/14/23 1959   magnesium hydroxide (MILK OF MAGNESIA) suspension 30 mL  30 mL Oral Daily PRN Sindy Guadeloupe, NP       risperiDONE (RISPERDAL M-TABS) disintegrating tablet 2 mg  2 mg Oral Daily Srihitha Tagliaferri, MD   2 mg at 02/15/23 0806   [START ON 02/16/2023]  risperiDONE ER SUSY 50 mg  50 mg Subcutaneous Q28 days Rawlins Stuard, Harrold Donath, MD       traZODone (DESYREL) tablet 50 mg  50 mg Oral QHS PRN Onuoha, Chinwendu V, NP   50 mg at 02/14/23 1959    Lab Results: No results found for this or any previous visit (from the past 48 hours).  Blood Alcohol level:  Lab Results  Component Value Date   ETH <10 02/09/2023   ETH <10 08/15/2022    Metabolic Disorder Labs: Lab Results  Component Value Date   HGBA1C 5.1 04/02/2019   MPG 100 04/02/2019   No results found for: "PROLACTIN" Lab Results  Component Value Date   CHOL 173 04/08/2022   TRIG 284 (H) 04/08/2022   HDL 63 04/08/2022   CHOLHDL 2.7 04/08/2022   VLDL 57 (H) 04/08/2022   LDLCALC 53 04/08/2022   LDLCALC 68 04/02/2019    Physical Findings: AIMS:  , ,  ,  ,    CIWA:    COWS:     Musculoskeletal: Strength & Muscle Tone: within normal limits Gait & Station: normal Patient leans: N/A  Psychiatric Specialty Exam:  Presentation  General Appearance:  Casual  Eye Contact: Good  Speech: Normal Rate  Speech Volume: Normal  Handedness: Right   Mood and Affect  Mood: Anxious  Affect: Appropriate; Congruent; Full Range   Thought Process  Thought Processes: Linear  Descriptions of Associations:Intact  Orientation:Full (Time, Place and Person)  Thought Content:Logical  History of Schizophrenia/Schizoaffective disorder:Yes  Duration of Psychotic Symptoms:Greater than six months  Hallucinations:Hallucinations: None  Ideas of Reference:None  Suicidal Thoughts:Suicidal Thoughts: No  Homicidal  Thoughts:Homicidal Thoughts: No   Sensorium  Memory: Immediate Fair; Recent Fair; Remote Fair  Judgment: Fair  Insight: Fair   Art therapist  Concentration: Fair  Attention Span: Fair  Recall: Good  Fund of Knowledge: Good  Language: Good   Psychomotor Activity  Psychomotor Activity: Psychomotor Activity: Normal   Assets  Assets: Communication Skills; Resilience   Sleep  Sleep: Sleep: Fair    Physical Exam: Physical Exam Vitals reviewed.  Constitutional:      General: He is not in acute distress.    Appearance: He is not toxic-appearing.  Pulmonary:     Effort: Pulmonary effort is normal. No respiratory distress.  Neurological:     Mental Status: He is alert.     Motor: No weakness.     Gait: Gait normal.  Psychiatric:        Behavior: Behavior normal.    Review of Systems  Constitutional:  Negative for chills and fever.  Cardiovascular:  Negative for chest pain and palpitations.  Neurological:  Negative for dizziness, tingling, tremors and headaches.  Psychiatric/Behavioral:  Negative for depression, hallucinations, memory loss, substance abuse and suicidal ideas. The patient is nervous/anxious. The patient does not have insomnia.   All other systems reviewed and are negative.  Blood pressure (!) 87/64, pulse 65, temperature 97.9 F (36.6 C), temperature source Oral, resp. rate 18, height 5\' 9"  (1.753 m), weight 68 kg, SpO2 98%. Body mass index is 22.14 kg/m.   Treatment Plan Summary: Daily contact with patient to assess and evaluate symptoms and progress in treatment and Medication management   ASSESSMENT:  Diagnoses / Active Problems: Paranoid schizophrenia.  Also consider schizoaffective disorder bipolar type.  He also has a documented history of bipolar disorder.  PLAN: Safety and Monitoring:  -- Involuntary admission to inpatient psychiatric unit for safety, stabilization and treatment  --  Daily contact with patient to  assess and evaluate symptoms and progress in treatment  -- Patient's case to be discussed in multi-disciplinary team meeting  -- Observation Level : q15 minute checks  -- Vital signs:  q12 hours  -- Precautions: suicide, elopement, and assault  2. Psychiatric Diagnoses and Treatment:     -Continue risperdal 2 mg daily for schizoprenia   -Give Risperdal Uzedy LAI 50 mg on 12-25    --  The risks/benefits/side-effects/alternatives to this medication were discussed in detail with the patient and time was given for questions. The patient consents to medication trial.    -- Metabolic profile and EKG monitoring obtained while on an atypical antipsychotic (BMI: Lipid Panel: HbgA1c: QTc:)   -- Encouraged patient to participate in unit milieu and in scheduled group therapies     3. Medical Issues Being Addressed:     4. Discharge Planning:   -- Social work and case management to assist with discharge planning and identification of hospital follow-up needs prior to discharge  -- Estimated LOS: 2-3 more days  -- Discharge Concerns: Need to establish a safety plan; Medication compliance and effectiveness  -- Discharge Goals: Return home with outpatient referrals for mental health follow-up including medication management/psychotherapy   Phineas Inches, MD 02/15/2023, 2:25 PM  Total Time Spent in Direct Patient Care:  I personally spent 35 minutes on the unit in direct patient care. The direct patient care time included face-to-face time with the patient, reviewing the patient's chart, communicating with other professionals, and coordinating care. Greater than 50% of this time was spent in counseling or coordinating care with the patient regarding goals of hospitalization, psycho-education, and discharge planning needs.   Phineas Inches, MD Psychiatrist

## 2023-02-15 NOTE — Progress Notes (Signed)
   02/15/23 2130  Psych Admission Type (Psych Patients Only)  Admission Status Involuntary  Psychosocial Assessment  Patient Complaints Anxiety  Eye Contact Fair  Facial Expression Flat  Affect Anxious  Speech Logical/coherent  Interaction Arrogant  Motor Activity Slow  Appearance/Hygiene Disheveled  Behavior Characteristics Cooperative  Mood Anxious  Thought Process  Coherency Circumstantial  Content Blaming others  Delusions WDL  Perception Hallucinations  Hallucination Auditory  Judgment Impaired  Confusion WDL  Danger to Self  Current suicidal ideation? Denies  Danger to Others  Danger to Others None reported or observed

## 2023-02-15 NOTE — Plan of Care (Signed)
  Problem: Education: Goal: Emotional status will improve Outcome: Progressing Goal: Mental status will improve Outcome: Progressing   

## 2023-02-15 NOTE — Progress Notes (Signed)
Referral made to [facility] for ACTT services. Will continue to follow and update treatment team.

## 2023-02-15 NOTE — Telephone Encounter (Signed)
Pharmacy Patient Advocate Encounter   Received notification that prior authorization for Uzedy 50MG /0.14ML syringes is required/requested.   Insurance verification completed.   The patient is insured through [facility] [employer] .   Per test claim: PA required; PA submitted to above mentioned insurance via CoverMyMeds Key/confirmation #/EOC [id number] Status is pending

## 2023-02-15 NOTE — Plan of Care (Signed)
  Problem: Education: Goal: Emotional status will improve Outcome: Progressing Goal: Mental status will improve Outcome: Progressing   Problem: Activity: Goal: Interest or engagement in activities will improve Outcome: Progressing Goal: Sleeping patterns will improve Outcome: Progressing

## 2023-02-16 ENCOUNTER — Encounter (HOSPITAL_COMMUNITY): Payer: Self-pay

## 2023-02-16 DIAGNOSIS — F203 Undifferentiated schizophrenia: Secondary | ICD-10-CM | POA: Diagnosis not present

## 2023-02-16 MED ORDER — RISPERIDONE 1 MG PO TBDP
1.0000 mg | ORAL_TABLET | Freq: Every day | ORAL | Status: AC
  Administered 2023-02-17 – 2023-02-18 (×2): 1 mg via ORAL
  Filled 2023-02-16 (×3): qty 1

## 2023-02-16 MED ORDER — ACETAMINOPHEN 325 MG PO TABS
650.0000 mg | ORAL_TABLET | Freq: Four times a day (QID) | ORAL | Status: DC | PRN
  Administered 2023-02-17 – 2023-02-18 (×3): 650 mg via ORAL
  Filled 2023-02-16 (×3): qty 2
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
## 2023-02-16 NOTE — Plan of Care (Signed)
  Problem: Activity: Goal: Interest or engagement in activities will improve Outcome: Progressing   Problem: Coping: Goal: Ability to verbalize frustrations and anger appropriately will improve Outcome: Progressing   Problem: Safety: Goal: Periods of time without injury will increase Outcome: Progressing   

## 2023-02-16 NOTE — Progress Notes (Signed)
[facility] MD Progress Note  02/16/2023 2:46 PM [name]  MRN:  [id number]  Subjective:    This is one of several psychiatric admissions in this [facility] for this 52 year old Caucasian male with probable hx of chronic mental illnesses & substance use disorder. Admitted to the [facility] from the [facility] ED. Apparently, patient presented to the [facility] ED with complaint of homicidal ideations to no one in particular. However, chart review indicated that he may not have been taken his mental health medications for a while. Patient reported while at the ED that the voices were bothering him & was actively responding to internal stimuli. After medical evaluation/clearance, patient was transferred to the [facility] for further psychiatric evaluation/treatments.   Yesterday the psychiatry team made the following recommendations: -Continue risperdal 2 mg daily for schizoprenia  Today, the patient received the Risperdal Uzedy 50 mg LAI, and he denies any side effects or injection site reactions after this.  No EPS on my exam today.  Overall the patient's thoughts are much more logical, organized, and rational.  It seems that his thought disorder is likely at baseline with current treatment.  We discussed we will continue to taper off the Risperdal oral, now that he has received LAI.  Patient states he has nowhere to live and that he is homeless.  I recommended to him the option for an [place], the patient states he receives disability.  Social work will follow up  on this with the patient.  Otherwise the patient reports that his mood is fine and denies feeling depressed or sad.  Reports anxiety levels low.  Reports sleeping is fine.  Concentration is better.  Appetite is okay.  Denies any SI or HI.  Denies any AH or VH.  Denies any paranoia.  Otherwise denies any side effects to his current psychiatric scheduled medications.      Principal Problem: Paranoid schizophrenia (HCC) Diagnosis:  Principal Problem:   Paranoid schizophrenia (HCC)  Total Time spent with patient: 20 minutes  Past Psychiatric History:  Per previous notes:  Reports history of prior psychiatric admissions, most recently in February 2019. At the time was admitted for psychotic symptoms, was diagnosed Schizoaffective Disorder, treated with Abilify long acting IM management, which he states was more recently changed to [place] Q 3 months  . Reports history of a prior suicide attempt " many years ago" by overdosing . He reports he has been diagnosed with Bipolar Disorder in the past .  Past Medical History:  Past Medical History:  Diagnosis Date   Anxiety    Bipolar 1 disorder (HCC)    Schizoaffective disorder, bipolar type (HCC)    Stroke [facility])     Past Surgical History:  Procedure Laterality Date   CHOLECYSTECTOMY N/A 12/24/2012   Procedure: LAPAROSCOPIC CHOLECYSTECTOMY;  Surgeon: Cherylynn Ridges, MD;  Location: [facility] OR;  Service: General;  Laterality: N/A;   FINGER SURGERY     Family History:  Family History  Problem Relation Age of Onset   Heart disease Mother    Family Psychiatric  History: See H&P  Social History:  Social History   Substance and Sexual Activity  Alcohol Use Yes   Alcohol/week: 20.0 standard drinks of alcohol   Types: 20 Cans of beer per week   Comment: BAC was clear     Social History   Substance and Sexual Activity  Drug Use No   Types: Marijuana   Comment: UDS was clear  Social History   Socioeconomic History   Marital status: Divorced    Spouse name: Not on file   Number of children: Not on file   Years of education: Not on file   Highest education level: Not on file  Occupational History   Not on file  Tobacco Use   Smoking status: Every Day    Current packs/day: 1.00    Average packs/day: 1 pack/day for 5.0 years (5.0 ttl pk-yrs)    Types: Cigarettes   Smokeless tobacco: Never  Substance and Sexual Activity   Alcohol use: Yes    Alcohol/week: 20.0  standard drinks of alcohol    Types: 20 Cans of beer per week    Comment: BAC was clear   Drug use: No    Types: Marijuana    Comment: UDS was clear   Sexual activity: Never    Birth control/protection: Condom  Other Topics Concern   Not on file  Social History Narrative   Not on file   Social Drivers of Health   Financial Resource Strain: Not on file  Food Insecurity: Food Insecurity Present (02/11/2023)   Hunger Vital Sign    Worried About Running Out of Food in the Last Year: Sometimes true    Ran Out of Food in the Last Year: Sometimes true  Transportation Needs: Unmet Transportation Needs (02/11/2023)   PRAPARE - Administrator, Civil Service (Medical): Yes    Lack of Transportation (Non-Medical): Yes  Physical Activity: Not on file  Stress: Not on file  Social Connections: Not on file   Additional Social History:                           Current Medications: Current Facility-Administered Medications  Medication Dose Route Frequency Provider Last Rate Last Admin   acetaminophen (TYLENOL) tablet 650 mg  650 mg Oral Q6H PRN Tayron Hunnell, Harrold Donath, MD       alum & mag hydroxide-simeth (MAALOX/MYLANTA) 200-200-20 MG/5ML suspension 30 mL  30 mL Oral Q4H PRN Sindy Guadeloupe, NP   30 mL at 02/16/23 0100   haloperidol (HALDOL) tablet 5 mg  5 mg Oral TID PRN Phineas Inches, MD   5 mg at 02/16/23 0043   And   diphenhydrAMINE (BENADRYL) capsule 50 mg  50 mg Oral TID PRN Phineas Inches, MD   50 mg at 02/16/23 0043   haloperidol lactate (HALDOL) injection 5 mg  5 mg Intramuscular TID PRN Dreden Rivere, Harrold Donath, MD       And   diphenhydrAMINE (BENADRYL) injection 50 mg  50 mg Intramuscular TID PRN Lucious Zou, Harrold Donath, MD       And   LORazepam (ATIVAN) injection 2 mg  2 mg Intramuscular TID PRN Gola Bribiesca, Harrold Donath, MD       haloperidol lactate (HALDOL) injection 10 mg  10 mg Intramuscular TID PRN Phineas Inches, MD   10 mg at 02/11/23 2112   And    diphenhydrAMINE (BENADRYL) injection 50 mg  50 mg Intramuscular TID PRN Phineas Inches, MD   50 mg at 02/11/23 2110   And   LORazepam (ATIVAN) injection 2 mg  2 mg Intramuscular TID PRN Phineas Inches, MD   2 mg at 02/11/23 2111   feeding supplement (ENSURE ENLIVE / ENSURE PLUS) liquid 237 mL  237 mL Oral BID BM Saquan Furtick, Harrold Donath, MD   237 mL at 02/16/23 1444   hydrOXYzine (ATARAX) tablet 25 mg  25 mg Oral TID PRN Welford Roche,  Chinwendu V, NP   25 mg at 02/15/23 2037   magnesium hydroxide (MILK OF MAGNESIA) suspension 30 mL  30 mL Oral Daily PRN Sindy Guadeloupe, NP       Melene Muller ON 02/17/2023] risperiDONE (RISPERDAL M-TABS) disintegrating tablet 1 mg  1 mg Oral Daily Chayse Zatarain, MD       risperiDONE ER SUSY 50 mg  50 mg Subcutaneous Q28 days Paytyn Mesta, Harrold Donath, MD   50 mg at 02/16/23 1034   traZODone (DESYREL) tablet 50 mg  50 mg Oral QHS PRN [place], Chinwendu V, NP   50 mg at 02/15/23 2037    Lab Results: No results found for this or any previous visit (from the past 48 hours).  Blood Alcohol level:  Lab Results  Component Value Date   ETH <10 02/09/2023   ETH <10 08/15/2022    Metabolic Disorder Labs: Lab Results  Component Value Date   HGBA1C 5.1 04/02/2019   MPG 100 04/02/2019   No results found for: "PROLACTIN" Lab Results  Component Value Date   CHOL 173 04/08/2022   TRIG 284 (H) 04/08/2022   HDL 63 04/08/2022   CHOLHDL 2.7 04/08/2022   VLDL 57 (H) 04/08/2022   LDLCALC 53 04/08/2022   LDLCALC 68 04/02/2019    Physical Findings: AIMS:  , ,  ,  ,    CIWA:    COWS:     Musculoskeletal: Strength & Muscle Tone: within normal limits Gait & Station: normal Patient leans: N/A  Psychiatric Specialty Exam:  Presentation  General Appearance:  Casual  Eye Contact: Good  Speech: Normal Rate  Speech Volume: Normal  Handedness: Right   Mood and Affect  Mood: Anxious  Affect: Appropriate; Congruent; Full Range   Thought Process  Thought  Processes: Linear  Descriptions of Associations:Intact  Orientation:Full (Time, Place and Person)  Thought Content:Logical  History of Schizophrenia/Schizoaffective disorder:Yes  Duration of Psychotic Symptoms:Greater than six months  Hallucinations:Hallucinations: None  Ideas of Reference:None  Suicidal Thoughts:Suicidal Thoughts: No  Homicidal Thoughts:Homicidal Thoughts: No   Sensorium  Memory: Immediate Fair; Recent Fair; Remote Fair  Judgment: Fair  Insight: Fair   Art therapist  Concentration: Fair  Attention Span: Fair  Recall: Good  Fund of Knowledge: Good  Language: Good   Psychomotor Activity  Psychomotor Activity: Psychomotor Activity: Normal   Assets  Assets: Communication Skills; Resilience   Sleep  Sleep: Sleep: Fair    Physical Exam: Physical Exam Vitals reviewed.  Constitutional:      General: He is not in acute distress.    Appearance: He is not toxic-appearing.  Pulmonary:     Effort: Pulmonary effort is normal. No respiratory distress.  Neurological:     Mental Status: He is alert.     Motor: No weakness.     Gait: Gait normal.  Psychiatric:        Behavior: Behavior normal.    Review of Systems  Constitutional:  Negative for chills and fever.  Cardiovascular:  Negative for chest pain and palpitations.  Neurological:  Negative for dizziness, tingling, tremors and headaches.  Psychiatric/Behavioral:  Negative for depression, hallucinations, memory loss, substance abuse and suicidal ideas. The patient is nervous/anxious. The patient does not have insomnia.   All other systems reviewed and are negative.  Blood pressure 101/66, pulse 86, temperature 97.7 F (36.5 C), temperature source Oral, resp. rate 18, height 5\' 9"  (1.753 m), weight 68 kg, SpO2 98%. Body mass index is 22.14 kg/m.   Treatment Plan Summary: Daily contact  with patient to assess and evaluate symptoms and progress in treatment and  Medication management   ASSESSMENT:  Diagnoses / Active Problems: Paranoid schizophrenia.  Also consider schizoaffective disorder bipolar type.  He also has a documented history of bipolar disorder.  PLAN: Safety and Monitoring:  -- Involuntary admission to inpatient psychiatric unit for safety, stabilization and treatment  -- Daily contact with patient to assess and evaluate symptoms and progress in treatment  -- Patient's case to be discussed in multi-disciplinary team meeting  -- Observation Level : q15 minute checks  -- Vital signs:  q12 hours  -- Precautions: suicide, elopement, and assault  2. Psychiatric Diagnoses and Treatment:     -Decrease risperdal from 2 mg -> to 1 mg daily for schizoprenia.  Goal to taper off the Risperdal completely over the next 1 to 2 days as the patient received the LAI on 12/25  -Administered Risperdal Uzedy LAI 50 mg on 12-25    --  The risks/benefits/side-effects/alternatives to this medication were discussed in detail with the patient and time was given for questions. The patient consents to medication trial.    -- Metabolic profile and EKG monitoring obtained while on an atypical antipsychotic (BMI: Lipid Panel: HbgA1c: QTc:)   -- Encouraged patient to participate in unit milieu and in scheduled group therapies     3. Medical Issues Being Addressed:     4. Discharge Planning:   -- Social work and case management to assist with discharge planning and identification of hospital follow-up needs prior to discharge  -- Estimated LOS: 2-3 more days  -- Discharge Concerns: Need to establish a safety plan; Medication compliance and effectiveness  -- Discharge Goals: Return home with outpatient referrals for mental health follow-up including medication management/psychotherapy   Phineas Inches, MD 02/16/2023, 2:46 PM  Total Time Spent in Direct Patient Care:  I personally spent 35 minutes on the unit in direct patient care. The direct  patient care time included face-to-face time with the patient, reviewing the patient's chart, communicating with other professionals, and coordinating care. Greater than 50% of this time was spent in counseling or coordinating care with the patient regarding goals of hospitalization, psycho-education, and discharge planning needs.   Phineas Inches, MD Psychiatrist

## 2023-02-16 NOTE — [facility] Group Notes (Signed)
Adult Psychoeducational Group Note  Date:  02/16/2023 Time:  11:27 AM  Group Topic/Focus:  Goals Group:   The focus of this group is to help patients establish daily goals to achieve during treatment and discuss how the patient can incorporate goal setting into their daily lives to aide in recovery.  Participation Level:  None  Participation Quality:  na  Affect:  na  Cognitive:  na  Insight: na  Engagement in Group:  na  Modes of Intervention:  na  Additional Comments:    Octavio Manns 02/16/2023, 11:27 AM

## 2023-02-16 NOTE — Progress Notes (Signed)
Patient transferred to room 407 from 500 hall for continuation of care. No distress noted or complaints made at time of transfer. Safety maintained.

## 2023-02-16 NOTE — Progress Notes (Signed)
Pt up yelling at times " there's someone outside my window" pt appeared confused and a little agitated at times. Pt stated he was not hallucinating but was complaining about seeing people outside his window. Pt given PRN confusion protocol 50 mg Benadryl and 5 mg Haldol

## 2023-02-16 NOTE — Progress Notes (Signed)
   02/16/23 0900  Psych Admission Type (Psych Patients Only)  Admission Status Involuntary  Psychosocial Assessment  Patient Complaints Anxiety  Eye Contact Fair  Facial Expression Flat  Affect Anxious  Speech Logical/coherent  Interaction Arrogant  Motor Activity Slow  Appearance/Hygiene Disheveled  Behavior Characteristics Cooperative;Appropriate to situation  Mood Anxious  Thought Process  Coherency Circumstantial  Content Blaming others  Delusions WDL  Perception Hallucinations  Hallucination Auditory  Judgment Impaired  Confusion WDL  Danger to Self  Current suicidal ideation? Denies  Agreement Not to Harm Self Yes  Description of Agreement verbal  Danger to Others  Danger to Others None reported or observed

## 2023-02-17 DIAGNOSIS — F200 Paranoid schizophrenia: Secondary | ICD-10-CM | POA: Diagnosis not present

## 2023-02-17 NOTE — Group Note (Signed)
Date:  02/17/2023 Time:  2:39 PM  Group Topic/Focus:  Coping With Mental Health Crisis:   The purpose of this group is to help patients identify strategies for coping with mental health crisis.  Group discusses possible causes of crisis and ways to manage them effectively.    Participation Level:  Did Not Attend    TAKASHI ATALLAH 02/17/2023, 2:39 PM

## 2023-02-17 NOTE — [facility] Group Notes (Signed)
Adult Psychoeducational Group Note  Date:  02/17/2023 Time:  9:57 PM  Group Topic/Focus:  Wrap-Up Group:   The focus of this group is to help patients review their daily goal of treatment and discuss progress on daily workbooks.  Participation Level:  Did Not Attend  [name] 02/17/2023, 9:57 PM

## 2023-02-17 NOTE — Plan of Care (Signed)
  Problem: Education: Goal: Knowledge of [facility] Education information/materials will improve Outcome: Progressing Goal: Emotional status will improve Outcome: Progressing Goal: Mental status will improve Outcome: Progressing Goal: Verbalization of understanding the information provided will improve Outcome: Progressing   Problem: Activity: Goal: Interest or engagement in activities will improve Outcome: Progressing Goal: Sleeping patterns will improve Outcome: Progressing   Problem: Coping: Goal: Ability to verbalize frustrations and anger appropriately will improve Outcome: Progressing Goal: Ability to demonstrate self-control will improve Outcome: Progressing   Problem: Health Behavior/Discharge Planning: Goal: Identification of resources available to assist in meeting health care needs will improve Outcome: Progressing Goal: Compliance with treatment plan for underlying cause of condition will improve Outcome: Progressing   Problem: Physical Regulation: Goal: Ability to maintain clinical measurements within normal limits will improve Outcome: Progressing   Problem: Safety: Goal: Periods of time without injury will increase Outcome: Progressing   Problem: Education: Goal: Ability to verbalize precipitating factors for violent behavior will improve Outcome: Progressing   Problem: Coping: Goal: Ability to verbalize frustrations and anger appropriately will improve Outcome: Progressing   Problem: Health Behavior/Discharge Planning: Goal: Ability to implement measures to prevent violent behavior in the future will improve Outcome: Progressing   Problem: Safety: Goal: Ability to demonstrate self-control will improve Outcome: Progressing Goal: Ability to redirect hostility and anger into socially appropriate behaviors will improve Outcome: Progressing   Problem: Education: Goal: Utilization of techniques to improve thought processes will  improve Outcome: Progressing Goal: Knowledge of the prescribed therapeutic regimen will improve Outcome: Progressing   Problem: Activity: Goal: Interest or engagement in leisure activities will improve Outcome: Progressing Goal: Imbalance in normal sleep/wake cycle will improve Outcome: Progressing   Problem: Coping: Goal: Coping ability will improve Outcome: Progressing Goal: Will verbalize feelings Outcome: Progressing   Problem: Health Behavior/Discharge Planning: Goal: Ability to make decisions will improve Outcome: Progressing Goal: Compliance with therapeutic regimen will improve Outcome: Progressing   Problem: Role Relationship: Goal: Will demonstrate positive changes in social behaviors and relationships Outcome: Progressing   Problem: Safety: Goal: Ability to disclose and discuss suicidal ideas will improve Outcome: Progressing Goal: Ability to identify and utilize support systems that promote safety will improve Outcome: Progressing   Problem: Self-Concept: Goal: Will verbalize positive feelings about self Outcome: Progressing Goal: Level of anxiety will decrease Outcome: Progressing

## 2023-02-17 NOTE — Progress Notes (Signed)
Patient appears pleasant. Patient denies SI/HI/AVH.  Pt reports good sleep and good appetite. Patient complied with morning medication with no reported side effects.  Pt complained of 8/10 pain in bilateral knees. Gave tylenol per MAR. Patient remains safe on Q93min checks and contracts for safety.       02/17/23 0836  Psych Admission Type (Psych Patients Only)  Admission Status Involuntary  Psychosocial Assessment  Patient Complaints None  Eye Contact Fair  Facial Expression Flat  Affect Apprehensive  Speech Logical/coherent  Interaction Assertive  Motor Activity Slow  Appearance/Hygiene Disheveled  Behavior Characteristics Cooperative;Appropriate to situation  Mood Anxious  Thought Process  Coherency Circumstantial  Content Blaming others  Delusions None reported or observed  Perception WDL  Hallucination None reported or observed  Judgment Impaired  Confusion None  Danger to Self  Current suicidal ideation? Denies  Agreement Not to Harm Self Yes  Description of Agreement verbal  Danger to Others  Danger to Others None reported or observed

## 2023-02-17 NOTE — Progress Notes (Signed)

## 2023-02-17 NOTE — Group Note (Signed)
Date:  02/17/2023 Time:  2:49 PM  Group Topic/Focus:  Goals Group:   The focus of this group is to help patients establish daily goals to achieve during treatment and discuss how the patient can incorporate goal setting into their daily lives to aide in recovery. Orientation:   The focus of this group is to educate the patient on the purpose and policies of crisis stabilization and provide a format to answer questions about their admission.  The group details unit policies and expectations of patients while admitted.    Participation Level:  Did Not Attend    Arnoldo Hooker 02/17/2023, 2:49 PM

## 2023-02-17 NOTE — Progress Notes (Signed)
[facility] MD Progress Note  02/17/2023 10:24 AM [name]  MRN:  [id number] Subjective:   [name] is a 52 yr old male who presented on 12/18 to [facility] with HI due to Hallucinations, he was admitted to [facility] on 12/20.  PPHx is significant for Schizoaffective Disorder, Bipolar Type and a history of EtOH Abuse, and 1 Suicide Attempt (OD- "many years ago"), Self Injurious Behavior (Cutting- 2021), and Multiple Psychiatric Hospitalizations (last- [facility] 03/2019).  Case was discussed in the multidisciplinary team. MAR was reviewed and patient was compliant with medications.  He received PRN Tylenol this morning.   Psychiatric Team made the following recommendations yesterday: -Received Risperdal Uzedy 50 mg 12/25 -Decreased Risperdal to 1 mg daily for Schizophrenia     On interview today patient reports he slept good last night.  He reports his appetite is doing good.  He reports no SI, HI, or AVH.  He reports no Paranoia or Ideas of Reference.  He reports no issues with his medications.  He reports that he tolerated the injection yesterday without issue.  He then asked how much longer he needs to continue with the oral tablet and discussed with him that there is only the need for a few days of oral overlap and he reported understanding.  He reports that he is still wanting to get into an [facility].  Discussed with him that Social Work would assist him with this.  He reports no other concerns at present.   Principal Problem: Paranoid schizophrenia (HCC) Diagnosis: Principal Problem:   Paranoid schizophrenia (HCC)  Total Time spent with patient:  I personally spent 35 minutes on the unit in direct patient care. The direct patient care time included face-to-face time with the patient, reviewing the patient's chart, communicating with other professionals, and coordinating care. Greater than 50% of this time was spent in counseling or coordinating care with the patient regarding goals of  hospitalization, psycho-education, and discharge planning needs.   Past Psychiatric History: Schizoaffective Disorder, Bipolar Type and a history of EtOH Abuse, and 1 Suicide Attempt (OD- "many years ago"), Self Injurious Behavior (Cutting- 2021), and Multiple Psychiatric Hospitalizations (last- [facility] 03/2019).  Past Medical History:  Past Medical History:  Diagnosis Date   Anxiety    Bipolar 1 disorder (HCC)    Schizoaffective disorder, bipolar type (HCC)    Stroke [facility])     Past Surgical History:  Procedure Laterality Date   CHOLECYSTECTOMY N/A 12/24/2012   Procedure: LAPAROSCOPIC CHOLECYSTECTOMY;  Surgeon: Cherylynn Ridges, MD;  Location: [facility] OR;  Service: General;  Laterality: N/A;   FINGER SURGERY     Family History:  Family History  Problem Relation Age of Onset   Heart disease Mother    Family Psychiatric  History:  Brother- EtOH Abuse Father- EtOH Abuse No Known Diagnosis' or Suicides  Social History:  Social History   Substance and Sexual Activity  Alcohol Use Yes   Alcohol/week: 20.0 standard drinks of alcohol   Types: 20 Cans of beer per week   Comment: BAC was clear     Social History   Substance and Sexual Activity  Drug Use No   Types: Marijuana   Comment: UDS was clear    Social History   Socioeconomic History   Marital status: Divorced    Spouse name: Not on file   Number of children: Not on file   Years of education: Not on file   Highest education level: Not on file  Occupational History  Not on file  Tobacco Use   Smoking status: Every Day    Current packs/day: 1.00    Average packs/day: 1 pack/day for 5.0 years (5.0 ttl pk-yrs)    Types: Cigarettes   Smokeless tobacco: Never  Substance and Sexual Activity   Alcohol use: Yes    Alcohol/week: 20.0 standard drinks of alcohol    Types: 20 Cans of beer per week    Comment: BAC was clear   Drug use: No    Types: Marijuana    Comment: UDS was clear   Sexual activity: Never    Birth  control/protection: Condom  Other Topics Concern   Not on file  Social History Narrative   Not on file   Social Drivers of Health   Financial Resource Strain: Not on file  Food Insecurity: Food Insecurity Present (02/11/2023)   Hunger Vital Sign    Worried About Running Out of Food in the Last Year: Sometimes true    Ran Out of Food in the Last Year: Sometimes true  Transportation Needs: Unmet Transportation Needs (02/11/2023)   PRAPARE - Administrator, Civil Service (Medical): Yes    Lack of Transportation (Non-Medical): Yes  Physical Activity: Not on file  Stress: Not on file  Social Connections: Not on file   Additional Social History:                         Sleep: Good  Appetite:  Good  Current Medications: Current Facility-Administered Medications  Medication Dose Route Frequency Provider Last Rate Last Admin   acetaminophen (TYLENOL) tablet 650 mg  650 mg Oral Q6H PRN Massengill, Harrold Donath, MD   650 mg at 02/17/23 0806   alum & mag hydroxide-simeth (MAALOX/MYLANTA) 200-200-20 MG/5ML suspension 30 mL  30 mL Oral Q4H PRN Sindy Guadeloupe, NP   30 mL at 02/16/23 0100   haloperidol (HALDOL) tablet 5 mg  5 mg Oral TID PRN Phineas Inches, MD   5 mg at 02/16/23 0043   And   diphenhydrAMINE (BENADRYL) capsule 50 mg  50 mg Oral TID PRN Phineas Inches, MD   50 mg at 02/16/23 0043   haloperidol lactate (HALDOL) injection 5 mg  5 mg Intramuscular TID PRN Massengill, Harrold Donath, MD       And   diphenhydrAMINE (BENADRYL) injection 50 mg  50 mg Intramuscular TID PRN Massengill, Harrold Donath, MD       And   LORazepam (ATIVAN) injection 2 mg  2 mg Intramuscular TID PRN Massengill, Harrold Donath, MD       haloperidol lactate (HALDOL) injection 10 mg  10 mg Intramuscular TID PRN Phineas Inches, MD   10 mg at 02/11/23 2112   And   diphenhydrAMINE (BENADRYL) injection 50 mg  50 mg Intramuscular TID PRN Phineas Inches, MD   50 mg at 02/11/23 2110   And   LORazepam  (ATIVAN) injection 2 mg  2 mg Intramuscular TID PRN Phineas Inches, MD   2 mg at 02/11/23 2111   feeding supplement (ENSURE ENLIVE / ENSURE PLUS) liquid 237 mL  237 mL Oral BID BM Massengill, Harrold Donath, MD   237 mL at 02/16/23 1444   hydrOXYzine (ATARAX) tablet 25 mg  25 mg Oral TID PRN Onuoha, Chinwendu V, NP   25 mg at 02/15/23 2037   magnesium hydroxide (MILK OF MAGNESIA) suspension 30 mL  30 mL Oral Daily PRN Sindy Guadeloupe, NP       risperiDONE (RISPERDAL M-TABS) disintegrating tablet  1 mg  1 mg Oral Daily Massengill, Nathan, MD   1 mg at 02/17/23 0804   risperiDONE ER SUSY 50 mg  50 mg Subcutaneous Q28 days Massengill, Harrold Donath, MD   50 mg at 02/16/23 1034   traZODone (DESYREL) tablet 50 mg  50 mg Oral QHS PRN Onuoha, Chinwendu V, NP   50 mg at 02/15/23 2037    Lab Results: No results found for this or any previous visit (from the past 48 hours).  Blood Alcohol level:  Lab Results  Component Value Date   ETH <10 02/09/2023   ETH <10 08/15/2022    Metabolic Disorder Labs: Lab Results  Component Value Date   HGBA1C 5.1 04/02/2019   MPG 100 04/02/2019   No results found for: "PROLACTIN" Lab Results  Component Value Date   CHOL 173 04/08/2022   TRIG 284 (H) 04/08/2022   HDL 63 04/08/2022   CHOLHDL 2.7 04/08/2022   VLDL 57 (H) 04/08/2022   LDLCALC 53 04/08/2022   LDLCALC 68 04/02/2019    Physical Findings: AIMS:  , ,  ,  ,    CIWA:    COWS:     Musculoskeletal: Strength & Muscle Tone: within normal limits Gait & Station:  uses walker Patient leans: N/A  Psychiatric Specialty Exam:  Presentation  General Appearance:  Appropriate for Environment  Eye Contact: Good  Speech: Clear and Coherent; Normal Rate  Speech Volume: Normal  Handedness: Right   Mood and Affect  Mood: -- ("ok")  Affect: Appropriate; Congruent   Thought Process  Thought Processes: Coherent; Goal Directed  Descriptions of Associations:Intact  Orientation:Full (Time, Place  and Person)  Thought Content:Logical; WDL  History of Schizophrenia/Schizoaffective disorder:Yes  Duration of Psychotic Symptoms:Greater than six months  Hallucinations:Hallucinations: None  Ideas of Reference:None  Suicidal Thoughts:Suicidal Thoughts: No  Homicidal Thoughts:Homicidal Thoughts: No   Sensorium  Memory: Immediate Fair; Recent Fair  Judgment: Fair  Insight: Fair   Art therapist  Concentration: Fair  Attention Span: Fair  Recall: Good  Fund of Knowledge: Good  Language: Good   Psychomotor Activity  Psychomotor Activity:Psychomotor Activity: Normal   Assets  Assets: Communication Skills; Resilience   Sleep  Sleep:Sleep: Good    Physical Exam: Physical Exam Vitals and nursing note reviewed.  Constitutional:      General: He is not in acute distress.    Appearance: Normal appearance. He is normal weight. He is not ill-appearing or toxic-appearing.  HENT:     Head: Normocephalic and atraumatic.  Pulmonary:     Effort: Pulmonary effort is normal.  Neurological:     General: No focal deficit present.     Mental Status: He is alert.    Review of Systems  Respiratory:  Negative for cough and shortness of breath.   Cardiovascular:  Negative for chest pain.  Gastrointestinal:  Negative for abdominal pain, constipation, diarrhea, nausea and vomiting.  Neurological:  Negative for dizziness, weakness and headaches.  Psychiatric/Behavioral:  Negative for depression, hallucinations and suicidal ideas. The patient is not nervous/anxious.    Blood pressure 92/63, pulse 83, temperature 97.7 F (36.5 C), temperature source Oral, resp. rate 18, height 5\' 9"  (1.753 m), weight 68 kg, SpO2 98%. Body mass index is 22.14 kg/m.   Treatment Plan Summary: Daily contact with patient to assess and evaluate symptoms and progress in treatment and Medication management  [name] is a 52 yr old male who presented on 12/18 to [facility] with HI  due to Hallucinations, he was  admitted to [facility] on 12/20.  PPHx is significant for Schizoaffective Disorder, Bipolar Type and a history of EtOH Abuse, and 1 Suicide Attempt (OD- "many years ago"), Self Injurious Behavior (Cutting- 2021), and Multiple Psychiatric Hospitalizations (last- [facility] 03/2019).   Orben has tolerated the Uzedy well without side effects.  He continues to have some tangentiality in his speech but is improving.  We will continue with his current dose of Risperdal for today but will consider further reduction or discontinuation tomorrow.  Social work will assist with [facility] placement as he is still interested in this.  We will not make any changes to his medications at this time.  We will continue to monitor.    Schizoaffective Disorder -Continue Risperdal 1 mg daily for Schizophrenia -Received Risperdal Uzedy 50 mg 12/25 -Continue Agitation Protocol: Haldol/Ativan/Benadryl   -Continue Ensure 237 mL BID -Continue PRN's: Tylenol, Maalox, Atarax, Milk of Magnesia, Trazodone   Lauro Franklin, MD 02/17/2023, 10:24 AM

## 2023-02-17 NOTE — Plan of Care (Signed)
  Problem: Education: Goal: Knowledge of [facility] Education information/materials will improve Outcome: Progressing Goal: Emotional status will improve Outcome: Progressing Goal: Verbalization of understanding the information provided will improve Outcome: Progressing   Problem: Activity: Goal: Sleeping patterns will improve Outcome: Progressing   Problem: Coping: Goal: Ability to demonstrate self-control will improve Outcome: Progressing   Problem: Activity: Goal: Interest or engagement in activities will improve Outcome: Not Progressing

## 2023-02-17 NOTE — Progress Notes (Signed)
   02/17/23 2300  Psych Admission Type (Psych Patients Only)  Admission Status Involuntary  Psychosocial Assessment  Patient Complaints Suspiciousness  Eye Contact Fair  Facial Expression Flat  Affect Apprehensive  Speech Logical/coherent  Interaction Assertive  Motor Activity Slow  Appearance/Hygiene Disheveled  Behavior Characteristics Appropriate to situation  Mood Anxious  Thought Process  Coherency Circumstantial  Content Blaming others  Delusions Paranoid  Perception Derealization  Hallucination None reported or observed  Judgment Limited  Confusion None  Danger to Self  Current suicidal ideation? Denies  Agreement Not to Harm Self Yes  Description of Agreement Verbal contract for safety  Danger to Others  Danger to Others None reported or observed

## 2023-02-17 NOTE — Plan of Care (Signed)
  Problem: Education: Goal: Knowledge of [facility] Education information/materials will improve Outcome: Progressing Goal: Emotional status will improve Outcome: Progressing Goal: Mental status will improve Outcome: Progressing Goal: Verbalization of understanding the information provided will improve Outcome: Progressing   

## 2023-02-17 NOTE — Group Note (Signed)
LCSW Group Therapy Note   Group Date: 02/17/2023 Start Time: 1100 End Time: 1200  Type of Therapy and Topic:  Group Therapy - Who Am I?   Participation Level:  Did Not Attend   Description of Group:  The focus of this group was to aid patients in self-exploration and awareness. Patients were guided in exploring various factors of oneself to include interests, readiness to change, management of emotions, and individual perception of self. Patients were provided with complementary worksheets exploring hidden talents, ease of asking other for help, music/media preferences, understanding and responding to feelings/emotions, and hope for the future. At group closing, patients were encouraged to adhere to discharge plan to assist in continued self-exploration and understanding.   Therapeutic Goals: 1. Patients learned that self-exploration and awareness is an ongoing process  2. Patients identified their individual skills, preferences, and abilities  3. Patients explored their openness to establish and confide in supports  4. Patients explored their readiness for change and progression of mental health   Summary of Patient Progress:   Did not attend  Therapeutic Modalities: Cognitive Behavioral Therapy  Motivational Interviewing    Alla Feeling, LCSWA 02/17/2023  12:43 PM

## 2023-02-18 DIAGNOSIS — F200 Paranoid schizophrenia: Secondary | ICD-10-CM | POA: Diagnosis not present

## 2023-02-18 MED ORDER — UZEDY 50 MG/0.14ML ~~LOC~~ SUSY: 50.0000 mg | PREFILLED_SYRINGE | SUBCUTANEOUS | 0 refills | Status: AC

## 2023-02-18 MED ORDER — TRAZODONE HCL 50 MG PO TABS: 50.0000 mg | ORAL_TABLET | Freq: Every evening | ORAL | 0 refills | Status: AC | PRN

## 2023-02-18 NOTE — Progress Notes (Signed)
Patient discharged to home via taxi. Discharge instructions, all required discharge documents and information about follow-up appointment given to pt with verbalization of understanding. All personal belongings returned to pt at time of discharge. Plan of Care resolved. Pt escorted to lobby by RN at 1048.  02/18/23 5784  Psych Admission Type (Psych Patients Only)  Admission Status Involuntary  Psychosocial Assessment  Patient Complaints None  Eye Contact Fair  Facial Expression Flat  Affect Apprehensive  Speech Logical/coherent  Interaction Assertive  Motor Activity Slow  Appearance/Hygiene Disheveled  Behavior Characteristics Appropriate to situation  Mood Anxious  Thought Process  Coherency Circumstantial  Content Blaming others  Delusions None reported or observed  Perception WDL  Hallucination None reported or observed  Judgment Limited  Confusion None  Danger to Self  Current suicidal ideation? Denies  Agreement Not to Harm Self Yes  Description of Agreement Verbal  Danger to Others  Danger to Others None reported or observed

## 2023-02-18 NOTE — BHH Group Notes (Signed)
Adult Psychoeducational Group Note  Date:  02/18/2023 Time:  9:45 AM  Group Topic/Focus:  Emotional Education:   The focus of this group is to discuss what feelings/emotions are, and how they are experienced. Goals Group:   The focus of this group is to help patients establish daily goals to achieve during treatment and discuss how the patient can incorporate goal setting into their daily lives to aide in recovery.  Participation Level:  Did Not Attend    Additional Comments:  Did not attend  Yarel Kilcrease Alen Blew 02/18/2023,

## 2023-02-18 NOTE — Plan of Care (Signed)
  Problem: Education: Goal: Mental status will improve Outcome: Progressing   Problem: Activity: Goal: Sleeping patterns will improve Outcome: Progressing   Problem: Coping: Goal: Ability to verbalize frustrations and anger appropriately will improve Outcome: Progressing Goal: Ability to demonstrate self-control will improve Outcome: Progressing   Problem: Health Behavior/Discharge Planning: Goal: Identification of resources available to assist in meeting health care needs will improve Outcome: Progressing Goal: Compliance with treatment plan for underlying cause of condition will improve Outcome: Progressing   Problem: Safety: Goal: Periods of time without injury will increase Outcome: Progressing

## 2023-02-18 NOTE — Progress Notes (Signed)
  [facility] Adult Case Management Discharge Plan :  Will you be returning to the same living situation after discharge:  Yes,  pt will be discharged to the [facility] [place]. Pt is homeless and shared with CSW that he has made a decision to be homeless.  At discharge, do you have transportation home?: Yes,  pt will be transported by [facility] to the [facility]. Do you have the ability to pay for your medications: Yes,  pt will pay cash for medications.  Release of information consent forms completed and in the chart;  Patient's signature needed at discharge.  Patient to Follow up at:  Follow-up Information     [facility]. Go on 02/22/2023.   Specialty: Behavioral Health Why: Please go to this provider on 02/22/23 at 7:00 am for medication management services. (OR) You may also go Monday through Fridays, arrive by 7:00 am for same day service. Contact information: [address] [place] [address] [phone]        [facility] Go to.   Specialty: Professional Counselor Why: Please go to this provider for therapy services.  You may also go Monday through Friday, from 9 am to 1 pm for an assessment. Contact information: [address] [place] [address] [phone]         [facility] Follow up.   Why: If you are interested in ACTT services please call number listed. Contact information: [address] [place] [address] [phone]         [facility] Follow up.   Why: If you are interested in services/resources for homelessness please call number listed or go to physical address between the hours of 8 am-3pm. Contact information: [address]  [place], [place] [address] Phone: [phone]                Next level of care provider has access to [facility] Link:yes  Safety Planning and Suicide Prevention discussed: Yes,  Safety planning completed with pt,  pt declined consents to speak with anyone else.     Has patient been referred to the Quitline?: Patient refused referral for treatment  Patient has been referred for addiction treatment: Patient refused referral for treatment.  Jaydn Fincher, Candace Cruise, LCSWA 02/18/2023, 9:38 AM

## 2023-02-18 NOTE — Transportation (Signed)
02/18/2023  [name] DOB: 11/13/1970 MRN: [id number]   RIDER WAIVER AND RELEASE OF LIABILITY  For the purposes of helping with transportation needs, [facility] partners with outside transportation providers (taxi companies, [place], Catering manager.) to give [employer] patients or other approved people the choice of on-demand rides [employer]") to our buildings for non-emergency visits.  By using [employer], I, the person signing this document, on behalf of myself and/or any legal minors (in my care using the [employer]), agree:  Science writer given to me are supplied by independent, outside transportation providers who do not work for, or have any affiliation with, [employer]. [place] is not a transportation company. [facility] has no control over the quality or safety of the rides I get using [employer]. [facility] has no control over whether any outside ride will happen on time or not. [facility] gives no guarantee on the reliability, quality, safety, or availability on any rides, or that no mistakes will happen. I know and accept that traveling by vehicle (car, truck, SVU, Zenaida Niece, bus, taxi, etc.) has risks of serious injuries such as disability, being paralyzed, and death. I know and agree the risk of using [employer] is mine alone, and not [employer]. Transport Services are provided "as is" and as are available. The transportation providers are in charge for all inspections and care of the vehicles used to provide these rides. I agree not to take legal action against [facility], its agents, employees, officers, directors, representatives, insurers, attorneys, assigns, successors, subsidiaries, and affiliates at any time for any reasons related directly or indirectly to using [employer]. I also agree not to take legal action against [place] or its affiliates for any injury, death, or damage to property caused by or related to using  [employer]. I have read this Waiver and Release of Liability, and I understand the terms used in it and their legal meaning. This Waiver is freely and voluntarily given with the understanding that my right (or any legal minors) to legal action against  relating to [employer] is knowingly given up to use these services.   I attest that I read the Ride Waiver and Release of Liability to [name], gave Mr. Du the opportunity to ask questions and answered the questions asked (if any). I affirm that [name] then provided consent for assistance with transportation.

## 2023-02-18 NOTE — Progress Notes (Signed)
[facility] refused vitals. RN notify.

## 2023-02-18 NOTE — Discharge Summary (Signed)
Physician Discharge Summary Note  Patient:  [name] is an 52 y.o., male MRN:  [id number] DOB:  1970-05-03 Patient phone:  [phone] (home)  Patient address:   [address] [place] [facility] [address],  Total Time spent with patient: 45 minutes  Date of Admission:  02/10/2023 Date of Discharge: 02/18/2023  Reason for Admission:  This is one of several psychiatric admissions in this [facility] for this 52 year old Caucasian male with probable hx of chronic mental illnesses & substance use disorder. Admitted to the [facility] from the [facility] ED. Apparently, patient presented to the [facility] ED with complaint of homicidal ideations to no one in particular. However, chart review indicated that he may not have been taken his mental health medications for a while. Patient reported while at the ED that the voices were bothering him & was actively responding to internal stimuli. After medical evaluation/clearance, patient was transferred to the [facility] for further psychiatric evaluation/treatments. A review of his toxicology/UDS reports showed there were no illegal substances in his system. His current labs results so far seem within normal including the EKG results of 02-10-23. Will obtain lipid panel, TSH, hgba1c. The last TSH was done 04-08-22, at the time, it was within norm.   Principal Problem: Paranoid schizophrenia [facility]) Discharge Diagnoses: Principal Problem:   Paranoid schizophrenia [facility])   Past Psychiatric History:  Per previous notes:  Reports history of prior psychiatric admissions, most recently in February 2019. At the time was admitted for psychotic symptoms, was diagnosed Schizoaffective Disorder, treated with Abilify long acting IM management, which he states was more recently changed to [place] Q 3 months  . Reports history of a prior suicide attempt " many years ago" by overdosing . He reports he has been diagnosed with Bipolar Disorder in the past .  Past Medical History:   Past Medical History:  Diagnosis Date   Anxiety    Bipolar 1 disorder (HCC)    Schizoaffective disorder, bipolar type (HCC)    Stroke [facility])     Past Surgical History:  Procedure Laterality Date   CHOLECYSTECTOMY N/A 12/24/2012   Procedure: LAPAROSCOPIC CHOLECYSTECTOMY;  Surgeon: Cherylynn Ridges, MD;  Location: [facility] OR;  Service: General;  Laterality: N/A;   FINGER SURGERY      Family History:  Family History  Problem Relation Age of Onset   Heart disease Mother    Family Psychiatric  History:  Patient is unable to provide this information at this time due to psychosis/disorganization.  Social History:  Social History   Substance and Sexual Activity  Alcohol Use Yes   Alcohol/week: 20.0 standard drinks of alcohol   Types: 20 Cans of beer per week   Comment: BAC was clear     Social History   Substance and Sexual Activity  Drug Use No   Types: Marijuana   Comment: UDS was clear    Social History   Socioeconomic History   Marital status: Divorced    Spouse name: Not on file   Number of children: Not on file   Years of education: Not on file   Highest education level: Not on file  Occupational History   Not on file  Tobacco Use   Smoking status: Every Day    Current packs/day: 1.00    Average packs/day: 1 pack/day for 5.0 years (5.0 ttl pk-yrs)    Types: Cigarettes   Smokeless tobacco: Never  Substance and Sexual Activity   Alcohol use: Yes  Alcohol/week: 20.0 standard drinks of alcohol    Types: 20 Cans of beer per week    Comment: BAC was clear   Drug use: No    Types: Marijuana    Comment: UDS was clear   Sexual activity: Never    Birth control/protection: Condom  Other Topics Concern   Not on file  Social History Narrative   Not on file   Social Drivers of Health   Financial Resource Strain: Not on file  Food Insecurity: Food Insecurity Present (02/11/2023)   Hunger Vital Sign    Worried About Running Out of Food in the Last Year: Sometimes true     Ran Out of Food in the Last Year: Sometimes true  Transportation Needs: Unmet Transportation Needs (02/11/2023)   PRAPARE - Administrator, Civil Service (Medical): Yes    Lack of Transportation (Non-Medical): Yes  Physical Activity: Not on file  Stress: Not on file  Social Connections: Not on file   Homeless, denies pending legal charges or court dates, poor social support.  Substance Use History:  Admits to marijuana use on and off as well as alcohol use on and off, UDS at time of admission was negative  Hospital Course:   During the patient's hospitalization, patient had extensive initial psychiatric evaluation, and follow-up psychiatric evaluations every day.   Psychiatric diagnoses provided upon initial assessment: Undifferentiated schizophrenia.    Patient's psychiatric medications were adjusted on admission: -Initiated paliperidone 6 mg po daily for psychosis. (The plan is  to transition patient to the monthly paliperidone injectable by discharge). -Continue Hydroxyzine 25 mg po tid prn for anxiety.  -Continue Trazodone 50 mg po Q hs prn for anxiety.    During the hospitalization, other adjustments were made to the patient's psychiatric medication regimen: Oral Invega was discontinued and patient was given risperidone uzeddy 50 mg IM on 12/25 and to be given monthly afterward, patient agrees with compliance after discharge.   Patient's care was discussed during the interdisciplinary team meeting every day during the hospitalization.   The patient denied having side effects to prescribed psychiatric medication.   Gradually, patient started adjusting to milieu. The patient was evaluated each day by a clinical provider to ascertain response to treatment. Improvement was noted by the patient's report of decreasing symptoms, improved sleep and appetite, affect, medication tolerance, behavior, and participation in unit programming.  Patient was asked each day to complete  a self inventory noting mood, mental status, pain, new symptoms, anxiety and concerns.     Symptoms were reported as significantly decreased or resolved completely by discharge.    On day of discharge, patient was evaluated on 12/27 the patient reports that their mood is stable. The patient denied having suicidal thoughts for more than 48 hours prior to discharge.  Patient denies having homicidal thoughts.  Patient denies having auditory hallucinations.  Patient denies any visual hallucinations or other symptoms of psychosis. The patient was motivated to continue taking medication with a goal of continued improvement in mental health.  On day of discharge patient presented future and goal oriented and agreeing with outpatient follow-up with psychiatric provider for medication management as well as to start seeing primary care provider for management of chronic knee pain. It was reported that that night prior to discharge patient received Haldol and Benadryl IM for questionable related hallucinations, when patient was asked the day of discharge he reported that he was talking to himself but he was not hearing voices and he  denied auditory or visual hallucination for the past few days. The patient reports their target psychiatric symptoms of psychosis, hallucinations, worsening mood and depression responded well to the psychiatric medications, and the patient reports overall benefit other psychiatric hospitalization. Supportive psychotherapy was provided to the patient. The patient also participated in regular group therapy while hospitalized. Coping skills, problem solving as well as relaxation therapies were also part of the unit programming.   Labs were reviewed with the patient, and abnormal results were discussed with the patient.   The patient is able to verbalize their individual safety plan to this provider.   Behavioral Events: None   Restraints: None   Groups: Limited attendance and  participation   Medications Changes: As above   Sleep  Sleep:Sleep: Good Improved during hospital stay  Physical Findings: AIMS: Facial and Oral Movements Muscles of Facial Expression: None Lips and Perioral Area: None Jaw: None Tongue: None,Extremity Movements Upper (arms, wrists, hands, fingers): None Lower (legs, knees, ankles, toes): None, Trunk Movements Neck, shoulders, hips: None, Global Judgements Severity of abnormal movements overall : None Incapacitation due to abnormal movements: None Patient's awareness of abnormal movements: No Awareness, Dental Status Current problems with teeth and/or dentures?: No Does patient usually wear dentures?: No Edentia?: No  CIWA:    COWS:     Musculoskeletal: Strength & Muscle Tone: within normal limits Gait & Station: normal Patient leans: N/A   Psychiatric Specialty Exam:  General Appearance: appears at stated age, fairly dressed and groomed  Behavior: pleasant and cooperative  Psychomotor Activity:No psychomotor agitation or retardation noted   Eye Contact: good Speech: normal amount, tone, volume and latency   Mood: euthymic Affect: congruent, pleasant and interactive  Thought Process: linear, goal directed, no circumstantial or tangential thought process noted, no racing thoughts or flight of ideas Descriptions of Associations: intact Thought Content: Hallucinations: denies AH, VH , does not appear responding to stimuli Delusions: No paranoia or other delusions noted Suicidal Thoughts: denies SI, intention, plan  Homicidal Thoughts: denies HI, intention, plan   Alertness/Orientation: alert and fully oriented  Insight: fair, improved Judgment: fair, improved  Memory: intact  Executive Functions  Concentration: intact  Attention Span: Fair Recall: intact Fund of Knowledge: fair   Assets  Assets: Manufacturing systems engineer; Resilience   Sleep Sleep: Sleep: Good    Physical Exam:  Physical  Exam Vitals and nursing note reviewed.  Constitutional:      Appearance: Normal appearance.  HENT:     Head: Normocephalic and atraumatic.     Nose: Nose normal.  Eyes:     Extraocular Movements: Extraocular movements intact.  Pulmonary:     Effort: Pulmonary effort is normal.  Musculoskeletal:        General: Normal range of motion.     Cervical back: Normal range of motion.  Neurological:     General: No focal deficit present.     Mental Status: He is alert and oriented to person, place, and time.  Psychiatric:        Mood and Affect: Mood normal.        Behavior: Behavior normal.        Thought Content: Thought content normal.    Review of Systems  All other systems reviewed and are negative.  Blood pressure 97/65, pulse 69, temperature 97.7 F (36.5 C), temperature source Oral, resp. rate 18, height 5\' 9"  (1.753 m), weight 68 kg, SpO2 96%. Body mass index is 22.14 kg/m.   Social History   Tobacco Use  Smoking Status Every Day   Current packs/day: 1.00   Average packs/day: 1 pack/day for 5.0 years (5.0 ttl pk-yrs)   Types: Cigarettes  Smokeless Tobacco Never   Tobacco Cessation:  A prescription for an FDA-approved tobacco cessation medication was offered at discharge and the patient refused   Blood Alcohol level:  Lab Results  Component Value Date   [facility] <10 02/09/2023   ETH <10 08/15/2022    Metabolic Disorder Labs:  Lab Results  Component Value Date   HGBA1C 5.1 04/02/2019   MPG 100 04/02/2019   No results found for: "PROLACTIN" Lab Results  Component Value Date   CHOL 173 04/08/2022   TRIG 284 (H) 04/08/2022   HDL 63 04/08/2022   CHOLHDL 2.7 04/08/2022   VLDL 57 (H) 04/08/2022   LDLCALC 53 04/08/2022   LDLCALC 68 04/02/2019    See Psychiatric Specialty Exam and Suicide Risk Assessment completed by Attending Physician prior to discharge.  Discharge destination:  Other:  Homeless shelter, patient refused [facility] referral  Is patient on  multiple antipsychotic therapies at discharge:  No   Has Patient had three or more failed trials of antipsychotic monotherapy by history:  No  Recommended Plan for Multiple Antipsychotic Therapies: NA  Discharge Instructions     Diet - low sodium heart healthy   Complete by: As directed    Increase activity slowly   Complete by: As directed       Allergies as of 02/18/2023   No Known Allergies      Medication List     STOP taking these medications    acetaminophen 500 MG tablet Commonly known as: TYLENOL   Aloe Vera 72 % Crea   aspirin EC 325 MG tablet   paliperidone 3 MG 24 hr tablet Commonly known as: INVEGA   UNKNOWN TO PATIENT       TAKE these medications      Indication  traZODone 50 MG tablet Commonly known as: DESYREL Take 1 tablet (50 mg total) by mouth at bedtime as needed for sleep.  Indication: Trouble Sleeping   Uzedy 50 MG/0.14ML Susy Generic drug: risperiDONE ER Inject 50 mg into the skin every 28 (twenty-eight) days. Next injection due on 03/17/2023 Start taking on: March 16, 2023  Indication: Schizophrenia        Follow-up Information     [facility]. Go on 02/22/2023.   Specialty: Behavioral Health Why: Please go to this provider on 02/22/23 at 7:00 am for medication management services. (OR) You may also go Monday through Fridays, arrive by 7:00 am for same day service. Contact information: [address] [place] [address] [phone]        [facility] Go to.   Specialty: Professional Counselor Why: Please go to this provider for therapy services.  You may also go Monday through Friday, from 9 am to 1 pm for an assessment. Contact information: [address] [place] [address] [phone]                 Discharge recommendations:   Activity: as tolerated  Diet: heart healthy  # It is recommended to the patient to continue  psychiatric medications as prescribed, after discharge from the hospital.     # It is recommended to the patient to follow up with your outpatient psychiatric provider and PCP.   # It was discussed with the patient, the impact of alcohol, drugs, tobacco have been there  overall psychiatric and medical wellbeing, and total abstinence from substance use was recommended the patient.ed.   # Prescriptions provided or sent directly to preferred pharmacy at discharge. Patient agreeable to plan. Given opportunity to ask questions. Appears to feel comfortable with discharge.    # In the event of worsening symptoms, the patient is instructed to call the crisis hotline, 911 and or go to the nearest ED for appropriate evaluation and treatment of symptoms. To follow-up with primary care provider for other medical issues, concerns and or health care needs   # Patient was discharged home with a plan to follow up as noted above.  -Follow-up with outpatient primary care doctor and other specialists -for management of chronic medical disease, including:  patient was given resources to establish care with PCP to address c/o chronic knee pain.    Patient agrees with D/C instructions and plan.   The patient received suicide prevention pamphlet:  Yes Belongings returned:  Clothing and Valuables  Total Time Spent in Direct Patient Care:  I personally spent 45 minutes on the unit in direct patient care. The direct patient care time included face-to-face time with the patient, reviewing the patient's chart, communicating with other professionals, and coordinating care. Greater than 50% of this time was spent in counseling or coordinating care with the patient regarding goals of hospitalization, psycho-education, and discharge planning needs.    SignedSarita Bottom, MD 02/18/2023, 9:29 AM

## 2023-02-18 NOTE — [facility] Suicide Risk Assessment (Signed)
[facility] Discharge Suicide Risk Assessment   Principal Problem: Paranoid schizophrenia [facility]) Discharge Diagnoses: Principal Problem:   Paranoid schizophrenia (HCC)   Total Time spent with patient: 45 minutes  Reason for admission: This is one of several psychiatric admissions in this [facility] for this 52 year old Caucasian male with probable hx of chronic mental illnesses & substance use disorder. Admitted to the [facility] from the [facility] ED. Apparently, patient presented to the [facility] ED with complaint of homicidal ideations to no one in particular. However, chart review indicated that he may not have been taken his mental health medications for a while. Patient reported while at the ED that the voices were bothering him & was actively responding to internal stimuli. After medical evaluation/clearance, patient was transferred to the [facility] for further psychiatric evaluation/treatments. A review of his toxicology/UDS reports showed there were no illegal substances in his system. His current labs results so far seem within normal including the EKG results of 02-10-23. Will obtain lipid panel, TSH, hgba1c. The last TSH was done 04-08-22, at the time, it was within norm.   PTA Medications:  Medications Prior to Admission Medication Sig Dispense Refill Last Dose/Taking  acetaminophen (TYLENOL) 500 MG tablet Take 500-1,000 mg by mouth every 6 (six) hours as needed for headache.        Aloe Vera 72 % CREA Apply to burned skin daily. (Patient not taking: Reported on 02/10/2023) 144 g 0    aspirin EC 325 MG tablet Take 325 mg by mouth daily as needed for mild pain (pain score 1-3) (or headaches).        paliperidone (INVEGA) 3 MG 24 hr tablet Take 1 tablet (3 mg total) by mouth daily. (Patient not taking: Reported on 02/10/2023) 14 tablet 0    UNKNOWN TO PATIENT Take 1 tablet by mouth See admin instructions. Unnamed "water pill"- Take 1 tablet by mouth once a day as  needed for swelling or water retention          Hospital Course:   During the patient's hospitalization, patient had extensive initial psychiatric evaluation, and follow-up psychiatric evaluations every day.  Psychiatric diagnoses provided upon initial assessment: Undifferentiated schizophrenia.   Patient's psychiatric medications were adjusted on admission: -Initiated paliperidone 6 mg po daily for psychosis. (The plan is  to transition patient to the monthly paliperidone injectable by discharge). -Continue Hydroxyzine 25 mg po tid prn for anxiety.  -Continue Trazodone 50 mg po Q hs prn for anxiety.   During the hospitalization, other adjustments were made to the patient's psychiatric medication regimen: Oral Invega was discontinued and patient was given risperidone uzeddy 50 mg IM on 12/25 and to be given monthly afterward, patient agrees with compliance after discharge.  Patient's care was discussed during the interdisciplinary team meeting every day during the hospitalization.  The patient denied having side effects to prescribed psychiatric medication.  Gradually, patient started adjusting to milieu. The patient was evaluated each day by a clinical provider to ascertain response to treatment. Improvement was noted by the patient's report of decreasing symptoms, improved sleep and appetite, affect, medication tolerance, behavior, and participation in unit programming.  Patient was asked each day to complete a self inventory noting mood, mental status, pain, new symptoms, anxiety and concerns.    Symptoms were reported as significantly decreased or resolved completely by discharge.   On day of discharge, patient was evaluated on 12/27 the patient reports that their mood is stable. The patient denied having suicidal  thoughts for more than 48 hours prior to discharge.  Patient denies having homicidal thoughts.  Patient denies having auditory hallucinations.  Patient denies any visual  hallucinations or other symptoms of psychosis. The patient was motivated to continue taking medication with a goal of continued improvement in mental health.  On day of discharge patient presented future and goal oriented and agreeing with outpatient follow-up with psychiatric provider for medication management as well as to start seeing primary care provider for management of chronic knee pain. It was reported that that night prior to discharge patient received Haldol and Benadryl IM for questionable related hallucinations, when patient was asked the day of discharge he reported that he was talking to himself but he was not hearing voices and he denied auditory or visual hallucination for the past few days. The patient reports their target psychiatric symptoms of psychosis, hallucinations, worsening mood and depression responded well to the psychiatric medications, and the patient reports overall benefit other psychiatric hospitalization. Supportive psychotherapy was provided to the patient. The patient also participated in regular group therapy while hospitalized. Coping skills, problem solving as well as relaxation therapies were also part of the unit programming.  Labs were reviewed with the patient, and abnormal results were discussed with the patient.  The patient is able to verbalize their individual safety plan to this provider.  Behavioral Events: None  Restraints: None  Groups: Limited attendance and participation  Medications Changes: As above  Sleep  Sleep:Sleep: Good Improved during hospital stay  Musculoskeletal: Strength & Muscle Tone: within normal limits Gait & Station: normal Patient leans: N/A  Psychiatric Specialty Exam  General Appearance: appears at stated age, fairly dressed and groomed  Behavior: pleasant and cooperative  Psychomotor Activity:No psychomotor agitation or retardation noted   Eye Contact: good Speech: normal amount, tone, volume and  latency   Mood: euthymic Affect: congruent, pleasant and interactive  Thought Process: linear, goal directed, no circumstantial or tangential thought process noted, no racing thoughts or flight of ideas Descriptions of Associations: intact Thought Content: Hallucinations: denies AH, VH , does not appear responding to stimuli Delusions: No paranoia or other delusions noted Suicidal Thoughts: denies SI, intention, plan  Homicidal Thoughts: denies HI, intention, plan   Alertness/Orientation: alert and fully oriented  Insight: fair, improved Judgment: fair, improved  Memory: intact  Executive Functions  Concentration: intact  Attention Span: Fair Recall: intact Fund of Knowledge: fair   Art therapist  Concentration: intact Attention Span: Fair Recall: intact Fund of Knowledge: fair   Assets  Assets: Manufacturing systems engineer; Resilience   Physical Exam: Physical Exam ROS Blood pressure 97/65, pulse 69, temperature 97.7 F (36.5 C), temperature source Oral, resp. rate 18, height 5\' 9"  (1.753 m), weight 68 kg, SpO2 96%. Body mass index is 22.14 kg/m.  Mental Status Per Nursing Assessment::   On Admission:  Thoughts of violence towards others  Demographic Factors:  Caucasian and Unemployed  Loss Factors: NA  Historical Factors: Impulsivity  Risk Reduction Factors:   NA  Continued Clinical Symptoms: Paranoia and hallucinations improved significantly during hospital stay Schizophrenia:   Paranoid or undifferentiated type  Cognitive Features That Contribute To Risk:  Polarized thinking    Suicide Risk:  Minimal: No identifiable suicidal ideation.  Patients presenting with no risk factors but with morbid ruminations; may be classified as minimal risk based on the severity of the depressive symptoms   Follow-up Information     [facility] Go on 02/22/2023.   Specialty: Behavioral  Health Why: Please go to this provider on  02/22/23 at 7:00 am for medication management services. (OR) You may also go Monday through Fridays, arrive by 7:00 am for same day service. Contact information: [address] [place] [address] [phone]        [facility] Go to.   Specialty: Professional Counselor Why: Please go to this provider for therapy services.  You may also go Monday through Friday, from 9 am to 1 pm for an assessment. Contact information: [address] [place] [address] [phone]                 Plan Of Care/Follow-up recommendations:   Discharge recommendations:    Activity: as tolerated  Diet: heart healthy  # It is recommended to the patient to continue psychiatric medications as prescribed, after discharge from the hospital.     # It is recommended to the patient to follow up with your outpatient psychiatric provider and PCP.   # It was discussed with the patient, the impact of alcohol, drugs, tobacco have been there overall psychiatric and medical wellbeing, and total abstinence from substance use was recommended the patient.ed.   # Prescriptions provided or sent directly to preferred pharmacy at discharge. Patient agreeable to plan. Given opportunity to ask questions. Appears to feel comfortable with discharge.    # In the event of worsening symptoms, the patient is instructed to call the crisis hotline, 911 and or go to the nearest ED for appropriate evaluation and treatment of symptoms. To follow-up with primary care provider for other medical issues, concerns and or health care needs   # Patient was discharged home with a plan to follow up as noted above.  -Follow-up with outpatient primary care doctor and other specialists -for management of chronic medical disease, including: patient was given resources to establish care with PCP to address c/o chronic knee pain.   Patient agrees with D/C instructions and plan.  The patient  received suicide prevention pamphlet:  Yes Belongings returned:  Clothing and Valuables  Total Time Spent in Direct Patient Care:  I personally spent 45 minutes on the unit in direct patient care. The direct patient care time included face-to-face time with the patient, reviewing the patient's chart, communicating with other professionals, and coordinating care. Greater than 50% of this time was spent in counseling or coordinating care with the patient regarding goals of hospitalization, psycho-education, and discharge planning needs.   Tolulope Pinkett 02/18/2023, 9:20 AM   Ellysa Parrack Abbott Pao, MD 02/18/2023, 9:20 AM

## 2023-02-18 NOTE — Progress Notes (Signed)
Patient with increased agitation as evidenced by throwing blankets around room, arguing loudly/hyper verbal with internal stimuli. Patient given mild agitation order set. See eMAR.

## 2023-02-22 ENCOUNTER — Ambulatory Visit (HOSPITAL_COMMUNITY)
Admission: EM | Admit: 2023-02-22 | Discharge: 2023-02-22 | Disposition: A | Discharge status: Home / Self Care | Payer: No Payment, Other | Attending: Psychiatry | Admitting: Psychiatry

## 2023-02-22 DIAGNOSIS — Z5900 Homelessness unspecified: Secondary | ICD-10-CM | POA: Insufficient documentation

## 2023-02-22 DIAGNOSIS — Z008 Encounter for other general examination: Secondary | ICD-10-CM

## 2023-02-22 DIAGNOSIS — F250 Schizoaffective disorder, bipolar type: Secondary | ICD-10-CM | POA: Insufficient documentation

## 2023-02-22 DIAGNOSIS — F319 Bipolar disorder, unspecified: Secondary | ICD-10-CM | POA: Insufficient documentation

## 2023-02-22 DIAGNOSIS — F419 Anxiety disorder, unspecified: Secondary | ICD-10-CM | POA: Insufficient documentation

## 2023-02-22 NOTE — BH Assessment (Signed)
 Comprehensive Clinical Assessment (CCA) Note  02/22/2023 [name] [id number]  Disposition: Jeffery Ivans, NP recommends discharge. Pt reports, once discharged he will walk to [facility].   The patient demonstrates the following risk factors for suicide: Chronic risk factors for suicide include: psychiatric disorder of Paranoid Schizophrenia . Acute risk factors for suicide include:  Pt denies, SI . Protective factors for this patient include:  UTA, pt would not disclose . Considering these factors, the overall suicide risk at this point appears to be no risk. Patient is not appropriate for outpatient follow up.  [name] is a 52 year old male who presents voluntary and unaccompanied to [facility]. Clinician asked the pt, what brought you to the hospital? Pt reports, he does not want to share, he called police to be taken to [facility] ED. Pt reports, he doesn't know why he was brought here.  Pt denies, SI, HI, hallucinations, self-injurious behaviors and access to weapons.   Pt reports, he does not want to be asked about substance use. Pt reports, it makes him want to go ballistic. Pt reports, he's been clean for three years. Per chart, pt was discharged from [facility] on 02/18/2023 after an eight day stay (pt was admitted on 02/10/2023) for Paranoid Schizophrenia.   Pt presents irritable in casual attire with normal eye contact and speech. Pt's mood was irritable. Pt's affect was congruent. Pt's insight was poor. Pt's judgment was fair. Pt reports, if discharged he can contract for safety. Pt reports, once discharged he will walk to [facility].   Chief Complaint: No chief complaint on file.  Visit Diagnosis: Deferred.     CCA Screening, Triage and Referral (STR)  Patient Reported Information How did you hear about us ? Legal System  What Is the Reason for Your Visit/Call Today? Pt refused to engaged in TTS assessment. Pt denies, SI, HI, hallucinations,  self-injurious behaviors and access to weapons.  How Long Has This Been Causing You Problems? -- (Unsure.)  What Do You Feel Would Help You the Most Today? -- (UTA, Pt refused the engage in assessment.)   Have You Recently Had Any Thoughts About Hurting Yourself? No  Are You Planning to Commit Suicide/Harm Yourself At This time? No   Flowsheet Row ED from 02/22/2023 in [facility] Admission (Discharged) from 02/10/2023 in [facility] INPATIENT ADULT [facility] ED from 02/09/2023 in [facility] Emergency Department at [facility]  C-SSRS RISK CATEGORY No Risk No Risk No Risk       Have you Recently Had Thoughts About Hurting Someone [name]? No  Are You Planning to Harm Someone at This Time? No  Explanation: None.   Have You Used Any Alcohol  or Drugs in the Past 24 Hours? No  What Did You Use and How Much? Pt denies use of ETOH or other drugs, due to religious reasons.   Do You Currently Have a Therapist/Psychiatrist? No  Name of Therapist/Psychiatrist: Name of Therapist/Psychiatrist: UTA, Pt refused the engage in assessment.   Have You Been Recently Discharged From Any Office Practice or Programs? -- (UTA, Pt refused the engage in assessment.)  Explanation of Discharge From Practice/Program: UTA, Pt refused the engage in assessment.     CCA Screening Triage Referral Assessment Type of Contact: Face-to-Face  Telemedicine Service Delivery:   Is this Initial or Reassessment?   Date Telepsych consult ordered in [facility]:    Time Telepsych consult ordered in [facility]:  Location of Assessment: [facility] Assessment Services  Provider Location: [facility] Assessment Services   Collateral Involvement: None.   Does Patient Have a Automotive Engineer Guardian? No  Legal Guardian Contact Information: Pt is his own guardian.  Copy of Legal Guardianship Form: -- (Pt is his own guardian.)  Legal Guardian Notified of Arrival: -- (Pt is his  own guardian.)  Legal Guardian Notified of Pending Discharge: -- (Pt is his own guardian.)  If Minor and Not Living with Parent(s), Who has Custody? Pt is an adult.  Is CPS involved or ever been involved? -- (UTA, Pt refused the engage in assessment.)  Is APS involved or ever been involved? -- (UTA, Pt refused the engage in assessment.)   Patient Determined To Be At Risk for Harm To Self or Others Based on Review of Patient Reported Information or Presenting Complaint? No  Method: No Plan  Availability of Means: No access or NA  Intent: Vague intent or NA  Notification Required: No need or identified person  Additional Information for Danger to Others Potential: -- (None.)  Additional Comments for Danger to Others Potential: None.  Are There Guns or Other Weapons in Your Home? No  Types of Guns/Weapons: Pt denies, access to weapons.  Are These Weapons Safely Secured?                            -- (Pt denies, access to weapons.)  Who Could Verify You Are Able To Have These Secured: Pt denies, access to weapons.  Do You Have any Outstanding Charges, Pending Court Dates, Parole/Probation? UTA, Pt refused the engage in assessment.  Contacted To Inform of Risk of Harm To Self or Others: Other: Comment (None.)    Does Patient Present under Involuntary Commitment? No    [place] of Residence: [place]   Patient Currently Receiving the Following Services: -- (UTA, Pt refused the engage in assessment.)   Determination of Need: Routine (7 days)   Options For Referral: Inpatient Hospitalization; Medication Management; Outpatient Therapy     CCA Biopsychosocial Patient Reported Schizophrenia/Schizoaffective Diagnosis in Past: Yes   Strengths: Pt wants help in the ED.   Mental Health Symptoms Depression:  Irritability   Duration of Depressive symptoms: Duration of Depressive Symptoms: N/A   Mania:  -- (UTA, Pt refused the engage in assessment.)   Anxiety:   --  (UTA, Pt refused the engage in assessment.)   Psychosis:  -- (UTA, Pt refused the engage in assessment.)   Duration of Psychotic symptoms: Duration of Psychotic Symptoms: N/A   Trauma:  -- (UTA, Pt refused the engage in assessment.)   Obsessions:  -- (UTA, Pt refused the engage in assessment.)   Compulsions:  -- (UTA, Pt refused the engage in assessment.)   Inattention:  -- (UTA, Pt refused the engage in assessment.)   Hyperactivity/Impulsivity:  -- (UTA, Pt refused the engage in assessment.)   Oppositional/Defiant Behaviors:  -- (UTA, Pt refused the engage in assessment.)   Emotional Irregularity:  -- (UTA, Pt refused the engage in assessment.)   Other Mood/Personality Symptoms:  None.    Mental Status Exam Appearance and self-care  Stature:  Average   Weight:  Average weight   Clothing:  Casual   Grooming:  Normal   Cosmetic use:  None   Posture/gait:  Normal   Motor activity:  Not Remarkable   Sensorium  Attention:  Normal   Concentration:  -- (UTA, Pt refused  the engage in assessment.)   Orientation:  Object; Person   Recall/memory:  Normal   Affect and Mood  Affect:  Congruent   Mood:  Irritable   Relating  Eye contact:  Normal   Facial expression:  Angry   Attitude toward examiner:  Irritable   Thought and Language  Speech flow: Other (Comment) (Irritable.)   Thought content:  Appropriate to Mood and Circumstances   Preoccupation:  None   Hallucinations:  None   Organization:  Circumstantial   Company Secretary of Knowledge:  Poor   Intelligence:  Average   Abstraction:  -- (UTA, Pt refused the engage in assessment.)   Judgement:  Fair   Reality Testing:  -- (UTA, Pt refused the engage in assessment.)   Insight:  Poor   Decision Making:  -- (UTA, Pt refused the engage in assessment.)   Social Functioning  Social Maturity:  -- (UTA, Pt refused the engage in assessment.)   Social Judgement:  -- (UTA, Pt refused the  engage in assessment.)   Stress  Stressors:  Other (Comment) (Pt denies.)   Coping Ability:  -- (UTA, Pt refused the engage in assessment.)   Skill Deficits:  Communication   Supports:  Other (Comment) (UTA, Pt refused the engage in assessment.)     Religion: Religion/Spirituality Are You A Religious Person?:  (UTA, Pt refused the engage in assessment.) How Might This Affect Treatment?: None.  Leisure/Recreation: Leisure / Recreation Do You Have Hobbies?:  (UTA, Pt refused the engage in assessment.)  Exercise/Diet: Exercise/Diet Do You Exercise?:  (UTA, Pt refused the engage in assessment.) Have You Gained or Lost A Significant Amount of Weight in the Past Six Months?:  (UTA, Pt refused the engage in assessment.) Do You Follow a Special Diet?:  (UTA, Pt refused the engage in assessment.) Do You Have Any Trouble Sleeping?:  (UTA, Pt refused the engage in assessment.)   CCA Employment/Education Employment/Work Situation: Employment / Work Situation Employment Situation:  (UTA, Pt refused the engage in assessment.) Why is Patient on Disability: UTA, Pt refused the engage in assessment. How Long has Patient Been on Disability: UTA, Pt refused the engage in assessment. Patient's Job has Been Impacted by Current Illness:  (UTA, Pt refused the engage in assessment.) Has Patient ever Been in the Military?:  (UTA, Pt refused the engage in assessment.)  Education: Education Is Patient Currently Attending School?:  (UTA, Pt refused the engage in assessment.) Last Grade Completed:  (UTA, Pt refused the engage in assessment.) Did You Attend College?:  (UTA, Pt refused the engage in assessment.) Did You Have An Individualized Education Program (IIEP):  (UTA, Pt refused the engage in assessment.) Did You Have Any Difficulty At School?:  (UTA, Pt refused the engage in assessment.) Patient's Education Has Been Impacted by Current Illness:  (UTA, Pt refused the engage in  assessment.)   CCA Family/Childhood History Family and Relationship History: Family history Marital status:  (UTA, Pt refused the engage in assessment.) Does patient have children?:  (UTA, Pt refused the engage in assessment.)  Childhood History:  Childhood History By whom was/is the patient raised?: Other (Comment) (UTA, Pt refused the engage in assessment.) Did patient suffer any verbal/emotional/physical/sexual abuse as a child?:  (UTA, Pt refused the engage in assessment.) Did patient suffer from severe childhood neglect?:  (UTA, Pt refused the engage in assessment.) Has patient ever been sexually abused/assaulted/raped as an adolescent or adult?:  (UTA, Pt refused the engage in assessment.) Was the patient  ever a victim of a crime or a disaster?:  (UTA, Pt refused the engage in assessment.) Witnessed domestic violence?:  (UTA, Pt refused the engage in assessment.) Has patient been affected by domestic violence as an adult?:  (UTA, Pt refused the engage in assessment.)   CCA Substance Use Alcohol /Drug Use: Alcohol  / Drug Use Pain Medications: See MAR Prescriptions: See MAR Over the Counter: See MAR History of alcohol  / drug use?:  (Pt reports, he prefers not to be asked that. It makes him want to go ballistic.) Longest period of sobriety (when/how long): Pt is three years sober. Negative Consequences of Use:  (UTA, Pt refused the engage in assessment.) Withdrawal Symptoms: Other (Comment) (UTA, Pt refused the engage in assessment.)    ASAM's:  Six Dimensions of Multidimensional Assessment  Dimension 1:  Acute Intoxication and/or Withdrawal Potential:   Dimension 1:  Description of individual's past and current experiences of substance use and withdrawal: UTA, Pt refused the engage in assessment.  Dimension 2:  Biomedical Conditions and Complications:   Dimension 2:  Description of patient's biomedical conditions and  complications: UTA, Pt refused the engage in assessment.   Dimension 3:  Emotional, Behavioral, or Cognitive Conditions and Complications:  Dimension 3:  Description of emotional, behavioral, or cognitive conditions and complications: UTA, Pt refused the engage in assessment.  Dimension 4:  Readiness to Change:  Dimension 4:  Description of Readiness to Change criteria: UTA, Pt refused the engage in assessment.  Dimension 5:  Relapse, Continued use, or Continued Problem Potential:  Dimension 5:  Relapse, continued use, or continued problem potential critiera description: UTA, Pt refused the engage in assessment.  Dimension 6:  Recovery/Living Environment:  Dimension 6:  Recovery/Iiving environment criteria description: UTA, Pt refused the engage in assessment.  ASAM Severity Score:    ASAM Recommended Level of Treatment:     Substance use Disorder (SUD)    Recommendations for Services/Supports/Treatments: Recommendations for Services/Supports/Treatments Recommendations For Services/Supports/Treatments: Other (Comment) (Pt to be discharged.)  Disposition Recommendation per psychiatric provider: Plan Post Discharge/Psychiatric Care Follow-up resources Pt wants to be discharged and go to [facility].    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   Substance induced mood disorder (HCC) 03/31/2019   Cocaine abuse with cocaine-induced mood disorder (HCC) 04/08/2014   Suicidal ideation 04/07/2014   Alcohol  abuse, episodic 04/07/2014   Alcohol  dependence with alcohol -induced mood disorder (HCC)    Anxiety state 08/31/2013   Schizoaffective disorder, bipolar type (HCC) 08/30/2013   Alcohol -induced mood disorder (HCC) 08/29/2013   Diarrhea 01/30/2013   Abdominal pain 12/18/2012   Compulsive tobacco user syndrome 09/20/2012     Referrals to Alternative Service(s): Referred to  Alternative Service(s):   Place:   Date:   Time:    Referred to Alternative Service(s):   Place:   Date:   Time:    Referred to Alternative Service(s):   Place:   Date:   Time:    Referred to Alternative Service(s):   Place:   Date:   Time:     Jackson JONETTA Broach, [facility] Comprehensive Clinical Assessment (CCA) Screening, Triage and Referral Note  02/22/2023 [name] [id number]  Chief Complaint: No chief complaint on file.  Visit Diagnosis:   Patient Reported Information How did you hear about us ? Legal System  What Is the Reason for Your Visit/Call Today?  Pt refused to engaged in TTS assessment. Pt denies, SI, HI, hallucinations, self-injurious behaviors and access to weapons.  How Long Has This Been Causing You Problems? -- (Unsure.)  What Do You Feel Would Help You the Most Today? -- (UTA, Pt refused the engage in assessment.)   Have You Recently Had Any Thoughts About Hurting Yourself? No  Are You Planning to Commit Suicide/Harm Yourself At This time? No   Have you Recently Had Thoughts About Hurting Someone [name]? No  Are You Planning to Harm Someone at This Time? No  Explanation: None.   Have You Used Any Alcohol  or Drugs in the Past 24 Hours? No  How Long Ago Did You Use Drugs or Alcohol ? None.  What Did You Use and How Much? None.   Do You Currently Have a Therapist/Psychiatrist? No  Name of Therapist/Psychiatrist: UTA, Pt refused the engage in assessment.   Have You Been Recently Discharged From Any Office Practice or Programs? -- (UTA, Pt refused the engage in assessment.)  Explanation of Discharge From Practice/Program: UTA, Pt refused the engage in assessment.    CCA Screening Triage Referral Assessment Type of Contact: Face-to-Face  Telemedicine Service Delivery:   Is this Initial or Reassessment?   Date Telepsych consult ordered in [facility]:    Time Telepsych consult ordered in [facility]:    Location of Assessment: [facility] Assessment Services  Provider  Location: [facility] Assessment Services    Collateral Involvement: None.   Does Patient Have a Automotive Engineer Guardian? No.  Name and Contact of Legal Guardian: Pt is his own guardian.  If Minor and Not Living with Parent(s), Who has Custody? Pt is an adult.  Is CPS involved or ever been involved? -- (UTA, Pt refused the engage in assessment.)  Is APS involved or ever been involved? -- (UTA, Pt refused the engage in assessment.)   Patient Determined To Be At Risk for Harm To Self or Others Based on Review of Patient Reported Information or Presenting Complaint? No  Method: No Plan  Availability of Means: No access or NA  Intent: Vague intent or NA  Notification Required: No need or identified person  Additional Information for Danger to Others Potential: -- (None.)  Additional Comments for Danger to Others Potential: None.  Are There Guns or Other Weapons in Your Home? No  Types of Guns/Weapons: Pt denies, access to weapons.  Are These Weapons Safely Secured?                            -- (Pt denies, access to weapons.)  Who Could Verify You Are Able To Have These Secured: Pt denies, access to weapons.  Do You Have any Outstanding Charges, Pending Court Dates, Parole/Probation? UTA, Pt refused the engage in assessment.  Contacted To Inform of Risk of Harm To Self or Others: Other: Comment (None.)   Does Patient Present under Involuntary Commitment? No    [place] of Residence: [place]   Patient Currently Receiving the Following Services: -- (UTA, Pt refused the engage in assessment.)   Determination of Need: Routine (7 days)   Options For Referral: Inpatient Hospitalization; Medication Management; Outpatient Therapy   Disposition Recommendation per psychiatric provider: Plan Post Discharge/Psychiatric Care Follow-up resources Pt wanted to be discharged and go to [facility].  Jung Ingerson D Karle Desrosier, LCMHC

## 2023-02-22 NOTE — Discharge Instructions (Addendum)

## 2023-02-22 NOTE — Progress Notes (Signed)
   02/22/23 0306  Patient Reported Information  How Did You Hear About Us ? Legal System  What Is the Reason for Your Visit/Call Today? Pt refused to engaged in TTS assessment. Pt denies, SI, HI, hallucinations, self-injurious behaviors and access to weapons.  How Long Has This Been Causing You Problems?  (Unsure.)  What Do You Feel Would Help You the Most Today?  (UTA, Pt refused the engage in assessment.)  Have You Recently Had Any Thoughts About Hurting Yourself? No  Are You Planning to Commit Suicide/Harm Yourself At This time? No  Have you Recently Had Thoughts About Hurting Someone [name]? No  Are You Planning To Harm Someone At This Time? No  Explanation: None.  Physical Abuse  (Pt refused the engage in assessment.)  Verbal Abuse  (Pt refused the engage in assessment.)  Sexual Abuse  (Pt refused the engage in assessment.)  Exploitation of patient/patient's resources  (Pt refused the engage in assessment.)  Self-Neglect  (Pt refused the engage in assessment.)  Possible abuse reported to: Other (Comment) (Pt refused the engage in assessment.)  Name of Therapist/Psychiatrist UTA, Pt refused the engage in assessment.  Have You Been Recently Discharged From Any Office Practice or Programs?  (UTA, Pt refused the engage in assessment.)  Explanation of Discharge From Practice/Program UTA, Pt refused the engage in assessment.  CCA Screening Triage Referral Assessment  Type of Contact Face-to-Face  Location of Assessment [facility] Assessment Services  Provider location [facility] Assessment Services  Collateral Involvement None.  Does Patient Have a Automotive Engineer Guardian? No  Legal Guardian Contact Information Pt is his own guardian.  Copy of Legal Guardianship Form in Chart  (Pt is his own guardian.)  Legal Guardian Notified of Arrival   (Pt is his own guardian.)  Legal Guardian Notified of Pending Discharge   (Pt is his own guardian.)  If Minor and Not Living with Parent(s),  Who has Custody? Pt is an adult.  Is CPS involved or ever been involved?  (UTA, Pt refused the engage in assessment.)  Is APS involved or ever been involved?  (UTA, Pt refused the engage in assessment.)  Patient Determined To Be At Risk for Harm To Self or Others Based on Review of Patient Reported Information or Presenting Complaint? No  Method No Plan  Availability of Means No access or NA  Intent Vague intent or NA  Notification Required No need or identified person  Additional Information for Danger to Others Potential  (None.)  Additional Comments for Danger to Others Potential None.  Are There Guns or Other Weapons in Your Home? No  Types of Guns/Weapons Pt denies, access to weapons.  Are These Weapons Safely Secured?  (Pt denies, access to weapons.)  Who Could Verify You Are Able To Have These Secured: Pt denies, access to weapons.  Do You Have any Outstanding Charges, Pending Court Dates, Parole/Probation? UTA, Pt refused the engage in assessment.  Contacted To Inform of Risk of Harm To Self or Others: Other: Comment (None.)  Does Patient Present under Involuntary Commitment? No  [place] Residence [place]  Patient Currently Receiving the Following Services:  (UTA, Pt refused the engage in assessment.)  Determination of Need Routine (7 days)  Options For Referral Inpatient Hospitalization;Medication Management;Outpatient Therapy    Determination of need: Routine.    Jeffery JONETTA Broach, MS, [facility] Triage Specialist [phone]

## 2023-02-22 NOTE — ED Provider Notes (Signed)
 [facility]al [facility] Medical Screening Exam  Patient Name: [name] MRN: [id number] Date of Evaluation: 02/22/23 Chief Complaint:   I'm just a little bit heated, that's all.  Diagnosis:  Final diagnoses:  Encounter for psychological evaluation    History of Present illness: [name] is a 52 y.o. male.  Psychiatric history of schizoaffective disorder, bipolar type, bipolar 1 disorder, anxiety, malingering, homelessness, Paranoid Schizophrenia, and substance, who presented voluntarily to [facility] via [employer] for psychological evaluation.  Patient was seen face-to-face by this provider and chart reviewed.  Per chart review, patient was recently admitted at the [facility] between 02/10/23-02/18/23 due to homicidal ideation. Patient was given risperidone  uzeddy 50 mg IM on 12/25 and to be given monthly afterward, patient agrees with compliance after discharge. Next injection due on 03/17/2023 . Patient is to continue Hydroxyzine  25 mg po tid prn for anxiety, and continue Trazodone  50 mg po Q hs prn for insomnia. Patient was provided outpatient follow up information for the [facility] walk in clinic. Patient has had 6 ED visits and 1 inpatient psychiatric hospitalization within the past 6 months.SABRA Hint, patient reports I'm just a little bit heated, I didn't ask to come here, the police brought me, I'm sorry for bothering yall, I just want to get my things and get out of here, that's all.  When asked why the police brought him to the [facility], patient reports It's something to do with my dad but that's over and not a problem, can I leave now?. Patient reports he lives with his mother.  He declined to answer further questions stating he just wants to leave and the police brought him to the [facility] against his will.  Recommend discharge. There is no evidence of psychosis. Patient is not a danger to himself or others at this time. He denies illicit substance abuse or access to a gun/weapon.  Patient  is reminded about his outpatient follow-up appointment for medication management with the [facility] walk-in clinic today 02/22/2023 at 7 AM and encouraged to follow up.  On evaluation, patient is alert, oriented x 3, and minimally cooperative. Speech is clear, normal rate and coherent. Pt appears casually dressed. Eye contact is good. Mood is anxious, affect is congruent with mood. Thought process is coherent/goal directed and thought content is WDL. Pt denies SI/HI/AVH. There is no objective indication that the patient is responding to internal stimuli. No delusions elicited during this assessment.     Flowsheet Row ED from 02/22/2023 in [facility] Admission (Discharged) from 02/10/2023 in [facility] INPATIENT ADULT [facility]B ED from 02/09/2023 in [facility] Emergency Department at [facility]  C-SSRS RISK CATEGORY No Risk No Risk No Risk       Psychiatric Specialty Exam  Presentation  General Appearance:Casual  Eye Contact:Good  Speech:Clear and Coherent  Speech Volume:Normal  Handedness:Right   Mood and Affect  Mood: Anxious  Affect: Congruent   Thought Process  Thought Processes: Coherent; Goal Directed  Descriptions of Associations:Intact  Orientation:Full (Time, Place and Person)  Thought Content:WDL  Diagnosis of Schizophrenia or Schizoaffective disorder in past: Yes  Duration of Psychotic Symptoms: N/A  Hallucinations:None hearing voices but could not say what the voices were saying  Ideas of Reference:None  Suicidal Thoughts:No Without Plan  Homicidal Thoughts:No   Sensorium  Memory: Immediate Fair  Judgment: Intact  Insight: Present   Executive Functions  Concentration: Good  Attention Span: Good  Recall: Fair  Fund of Knowledge:  Fair  Language: Fair   Psychomotor Activity  Psychomotor Activity: Normal   Assets  Assets: Communication Skills; Desire for Improvement   Sleep   Sleep: Fair  Number of hours:  4   Physical Exam: Physical Exam Constitutional:      General: He is not in acute distress.    Appearance: He is not diaphoretic.  HENT:     Head: Normocephalic.     Right Ear: External ear normal.  Eyes:     General:        Right eye: No discharge.        Left eye: No discharge.  Pulmonary:     Effort: No respiratory distress.  Chest:     Chest wall: No tenderness.  Neurological:     Mental Status: He is alert and oriented to person, place, and time.  Psychiatric:        Attention and Perception: Attention and perception normal.        Mood and Affect: Mood is anxious.        Speech: Speech normal.        Behavior: Behavior is cooperative.        Thought Content: Thought content normal.    Review of Systems  Constitutional:  Negative for chills, fever and malaise/fatigue.  HENT:  Negative for congestion.   Eyes:  Negative for discharge.  Respiratory:  Negative for cough, shortness of breath and wheezing.   Cardiovascular:  Negative for chest pain and palpitations.  Gastrointestinal:  Negative for diarrhea, nausea and vomiting.  Neurological:  Negative for dizziness, seizures and headaches.  Psychiatric/Behavioral:  The patient is nervous/anxious.    Blood pressure 115/69, pulse 68, temperature (!) 97.5 F (36.4 C), temperature source Oral, resp. rate 18, SpO2 98%. There is no height or weight on file to calculate BMI.  Musculoskeletal: Strength & Muscle Tone: within normal limits Gait & Station: normal Patient leans: N/A   [facility] MSE Discharge Disposition for Follow up and Recommendations: Based on my evaluation the patient does not appear to have an emergency medical condition and can be discharged with resources and follow up care in outpatient services for Medication Management and Individual Therapy  Recommend discharge home and follow-up with outpatient psychiatric services for medication management or therapy. Patient is  reminded about his outpatient follow-up appointment for medication management with the [facility] walk-in clinic today 02/22/2023 at 7 AM and encouraged to follow up.  Patient verbalizes his understanding.  Patient denies SI/HI/AVH or paranoia.  Patient does not meet inpatient psychiatric admission criteria or IVC criteria at this time.  There is no evidence of imminent risk of harm to self or others.  Discussed strict [facility] return protocols..  Discharge recommendations:  Patient is to take medications as prescribed. Please see information for follow-up appointment with psychiatry and therapy. Please follow up with your primary care provider for all medical related needs.   Therapy: We recommend that patient participate in individual therapy to address mental health concerns.  Medications: The patient or guardian is to contact a medical professional and/or outpatient provider to address any new side effects that develop. The patient or guardian should update outpatient providers of any new medications and/or medication changes.   Atypical antipsychotics: If you are prescribed an atypical antipsychotic, it is recommended that your height, weight, BMI, blood pressure, fasting lipid panel, and fasting blood sugar be monitored by your outpatient providers.  Safety:  The patient should abstain from use of illicit substances/drugs and abuse  of any medications. If symptoms worsen or do not continue to improve or if the patient becomes actively suicidal or homicidal then it is recommended that the patient return to the closest hospital emergency department, the [facility], or call 911 for further evaluation and treatment. National Suicide Prevention Lifeline 1-800-SUICIDE or 939 399 9610.  About 988 988 offers 24/7 access to trained crisis counselors who can help people experiencing mental health-related distress. People can call or text 988 or chat 988lifeline.org for  themselves or if they are worried about a loved one who may need crisis support.  Crisis Mobile: [facility]:                     6404168800 (for crisis response 24 hours a day) [facility] Hotline:                                            3174460410 discharge  Patient discharged home in stable condition.  Thurman LULLA Ivans, NP 02/22/2023, 3:32 AM
